# Patient Record
Sex: Female | Born: 1955
Health system: Southern US, Community
[De-identification: ages and names within clinical notes are randomized; demographics above are authoritative.]

## PROBLEM LIST (undated history)

## (undated) DIAGNOSIS — I10 Essential (primary) hypertension: Secondary | ICD-10-CM

## (undated) DIAGNOSIS — E785 Hyperlipidemia, unspecified: Secondary | ICD-10-CM

## (undated) DIAGNOSIS — M810 Age-related osteoporosis without current pathological fracture: Secondary | ICD-10-CM

## (undated) DIAGNOSIS — Z87820 Personal history of traumatic brain injury: Secondary | ICD-10-CM

## (undated) DIAGNOSIS — G5601 Carpal tunnel syndrome, right upper limb: Secondary | ICD-10-CM

## (undated) DIAGNOSIS — G43909 Migraine, unspecified, not intractable, without status migrainosus: Secondary | ICD-10-CM

## (undated) HISTORY — DX: Carpal tunnel syndrome, right upper limb: G56.01

## (undated) HISTORY — PX: OTHER SURGICAL HISTORY: SHX169

## (undated) HISTORY — DX: Age-related osteoporosis without current pathological fracture: M81.0

## (undated) HISTORY — DX: Personal history of traumatic brain injury: Z87.820

## (undated) HISTORY — DX: Essential (primary) hypertension: I10

## (undated) HISTORY — DX: Hyperlipidemia, unspecified: E78.5

## (undated) HISTORY — PX: LEEP: SHX91

## (undated) HISTORY — PX: MOUTH SURGERY: SHX715

## (undated) HISTORY — DX: Migraine, unspecified, not intractable, without status migrainosus: G43.909

---
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
## 1997-08-28 ENCOUNTER — Other Ambulatory Visit: Admission: RE | Admit: 1997-08-28 | Discharge: 1997-08-28 | Payer: Self-pay | Admitting: Obstetrics and Gynecology

## 1998-04-22 ENCOUNTER — Other Ambulatory Visit: Admission: RE | Admit: 1998-04-22 | Discharge: 1998-04-22 | Payer: Self-pay | Admitting: Obstetrics and Gynecology

## 1999-04-27 ENCOUNTER — Other Ambulatory Visit: Admission: RE | Admit: 1999-04-27 | Discharge: 1999-04-27 | Payer: Self-pay | Admitting: Obstetrics and Gynecology

## 2000-05-03 ENCOUNTER — Other Ambulatory Visit: Admission: RE | Admit: 2000-05-03 | Discharge: 2000-05-03 | Payer: Self-pay | Admitting: Obstetrics and Gynecology

## 2000-09-18 ENCOUNTER — Other Ambulatory Visit: Admission: RE | Admit: 2000-09-18 | Discharge: 2000-09-18 | Payer: Self-pay | Admitting: Obstetrics and Gynecology

## 2001-06-26 ENCOUNTER — Other Ambulatory Visit: Admission: RE | Admit: 2001-06-26 | Discharge: 2001-06-26 | Payer: Self-pay | Admitting: Obstetrics and Gynecology

## 2002-07-03 ENCOUNTER — Other Ambulatory Visit: Admission: RE | Admit: 2002-07-03 | Discharge: 2002-07-03 | Payer: Self-pay | Admitting: Obstetrics and Gynecology

## 2003-08-07 ENCOUNTER — Other Ambulatory Visit: Admission: RE | Admit: 2003-08-07 | Discharge: 2003-08-07 | Payer: Self-pay | Admitting: Obstetrics and Gynecology

## 2004-09-27 ENCOUNTER — Other Ambulatory Visit: Admission: RE | Admit: 2004-09-27 | Discharge: 2004-09-27 | Payer: Self-pay | Admitting: Obstetrics and Gynecology

## 2005-09-28 ENCOUNTER — Other Ambulatory Visit: Admission: RE | Admit: 2005-09-28 | Discharge: 2005-09-28 | Payer: Self-pay | Admitting: Obstetrics and Gynecology

## 2011-06-27 ENCOUNTER — Other Ambulatory Visit (HOSPITAL_COMMUNITY): Payer: Self-pay | Admitting: Internal Medicine

## 2011-06-27 DIAGNOSIS — Z780 Asymptomatic menopausal state: Secondary | ICD-10-CM

## 2011-07-04 ENCOUNTER — Ambulatory Visit (HOSPITAL_COMMUNITY): Payer: Self-pay

## 2011-07-06 ENCOUNTER — Other Ambulatory Visit: Payer: Self-pay | Admitting: Obstetrics and Gynecology

## 2011-07-06 DIAGNOSIS — R928 Other abnormal and inconclusive findings on diagnostic imaging of breast: Secondary | ICD-10-CM

## 2011-07-10 ENCOUNTER — Ambulatory Visit (HOSPITAL_COMMUNITY)
Admission: RE | Admit: 2011-07-10 | Discharge: 2011-07-10 | Disposition: A | Discharge status: Home / Self Care | Payer: BC Managed Care – PPO | Source: Ambulatory Visit | Attending: Internal Medicine | Admitting: Internal Medicine

## 2011-07-10 DIAGNOSIS — Z13820 Encounter for screening for osteoporosis: Secondary | ICD-10-CM | POA: Insufficient documentation

## 2011-07-10 DIAGNOSIS — Z780 Asymptomatic menopausal state: Secondary | ICD-10-CM

## 2011-07-19 ENCOUNTER — Ambulatory Visit
Admission: RE | Admit: 2011-07-19 | Discharge: 2011-07-19 | Disposition: A | Discharge status: Home / Self Care | Payer: BC Managed Care – PPO | Source: Ambulatory Visit | Attending: Obstetrics and Gynecology | Admitting: Obstetrics and Gynecology

## 2011-07-19 DIAGNOSIS — R928 Other abnormal and inconclusive findings on diagnostic imaging of breast: Secondary | ICD-10-CM

## 2011-09-06 ENCOUNTER — Other Ambulatory Visit: Payer: BC Managed Care – PPO | Admitting: Gastroenterology

## 2011-10-25 ENCOUNTER — Other Ambulatory Visit: Payer: BC Managed Care – PPO | Admitting: Gastroenterology

## 2011-11-21 DIAGNOSIS — G43909 Migraine, unspecified, not intractable, without status migrainosus: Secondary | ICD-10-CM | POA: Insufficient documentation

## 2011-11-21 DIAGNOSIS — M47812 Spondylosis without myelopathy or radiculopathy, cervical region: Secondary | ICD-10-CM | POA: Insufficient documentation

## 2012-02-07 ENCOUNTER — Other Ambulatory Visit (HOSPITAL_COMMUNITY): Payer: Self-pay | Admitting: Internal Medicine

## 2012-02-07 ENCOUNTER — Ambulatory Visit (HOSPITAL_COMMUNITY)
Admission: RE | Admit: 2012-02-07 | Discharge: 2012-02-07 | Disposition: A | Discharge status: Home / Self Care | Payer: BC Managed Care – PPO | Source: Ambulatory Visit | Attending: Internal Medicine | Admitting: Internal Medicine

## 2012-02-07 DIAGNOSIS — M25539 Pain in unspecified wrist: Secondary | ICD-10-CM

## 2012-05-07 ENCOUNTER — Other Ambulatory Visit (HOSPITAL_COMMUNITY): Payer: Self-pay | Admitting: Internal Medicine

## 2012-05-07 ENCOUNTER — Ambulatory Visit (HOSPITAL_COMMUNITY)
Admission: RE | Admit: 2012-05-07 | Discharge: 2012-05-07 | Disposition: A | Discharge status: Home / Self Care | Payer: BC Managed Care – PPO | Source: Ambulatory Visit | Attending: Internal Medicine | Admitting: Internal Medicine

## 2012-05-07 DIAGNOSIS — R52 Pain, unspecified: Secondary | ICD-10-CM

## 2012-05-07 DIAGNOSIS — M25519 Pain in unspecified shoulder: Secondary | ICD-10-CM | POA: Insufficient documentation

## 2012-05-27 ENCOUNTER — Encounter: Payer: Self-pay | Admitting: Gastroenterology

## 2012-06-07 ENCOUNTER — Encounter: Payer: Self-pay | Admitting: Gastroenterology

## 2012-06-07 ENCOUNTER — Ambulatory Visit (AMBULATORY_SURGERY_CENTER): Payer: BC Managed Care – PPO

## 2012-06-07 VITALS — Ht 61.0 in | Wt 107.0 lb

## 2012-06-07 DIAGNOSIS — Z1211 Encounter for screening for malignant neoplasm of colon: Secondary | ICD-10-CM

## 2012-06-07 MED ORDER — MOVIPREP 100 G PO SOLR: 1.0000 | Freq: Once | ORAL | Status: DC

## 2012-07-03 ENCOUNTER — Encounter: Payer: Self-pay | Admitting: Gastroenterology

## 2012-07-03 ENCOUNTER — Ambulatory Visit (AMBULATORY_SURGERY_CENTER): Payer: BC Managed Care – PPO | Admitting: Gastroenterology

## 2012-07-03 VITALS — BP 151/80 | HR 69 | Temp 96.9°F | Resp 47 | Ht 61.0 in | Wt 107.0 lb

## 2012-07-03 DIAGNOSIS — D126 Benign neoplasm of colon, unspecified: Secondary | ICD-10-CM

## 2012-07-03 DIAGNOSIS — Z1211 Encounter for screening for malignant neoplasm of colon: Secondary | ICD-10-CM

## 2012-07-03 MED ORDER — SODIUM CHLORIDE 0.9 % IV SOLN: 500.0000 mL | INTRAVENOUS | Status: DC

## 2012-07-03 NOTE — Progress Notes (Signed)
Propofol given over incremental dosages 

## 2012-07-03 NOTE — Op Note (Signed)
[facility] [address] [place], [address]   COLONOSCOPY PROCEDURE REPORT  PATIENT: Isabel Gazella, [name]  MR#: [id number] BIRTHDATE: June 17, 1956 , 56  yrs. old GENDER: Female ENDOSCOPIST: Meryl Dare, MD, [facility] REFERRED BM:WUXLK Ross, M.D. and Loree Fee, [facility] PROCEDURE DATE:  07/03/2012 PROCEDURE:   Colonoscopy with snare polypectomy ASA CLASS:   Class II INDICATIONS:average risk screening. MEDICATIONS: MAC sedation, administered by CRNA and propofol (Diprivan) 100mg  IV DESCRIPTION OF PROCEDURE:   After the risks benefits and alternatives of the procedure were thoroughly explained, informed consent was obtained.  A digital rectal exam revealed no abnormalities of the rectum.   The LB CF-[id number]  endoscope was introduced through the anus and advanced to the cecum, which was identified by both the appendix and ileocecal valve. No adverse events experienced with a tortuous colon.   The quality of the prep was good, using MoviPrep  The instrument was then slowly withdrawn as the colon was fully examined.  COLON FINDINGS: A sessile polyp measuring 5 mm in size was found in the sigmoid colon.  A polypectomy was performed with a cold snare. The resection was complete and the polyp tissue was completely retrieved.   The colon was otherwise normal.  There was no diverticulosis, inflammation, polyps or cancers unless previously stated.  Retroflexed views revealed no abnormalities. The time to cecum=3 minutes 06 seconds.  Withdrawal time=7 minutes 58 seconds. The scope was withdrawn and the procedure completed.  COMPLICATIONS: There were no complications.  ENDOSCOPIC IMPRESSION: 1.   Sessile polyp measuring 5 mm in the sigmoid colon; polypectomy performed with a cold snare 2.   The colon was otherwise normal  RECOMMENDATIONS: 1.  Await pathology results 2.  Repeat colonoscopy in 5 years if polyp adenomatous; otherwise 10 years   eSigned:   Meryl Dare, MD, [facility] 07/03/2012 2:14 PM

## 2012-07-03 NOTE — Patient Instructions (Addendum)
A handout was given to your care partner on polyps.  You may resume your current medications today.  Please call if any questions or concerns.    YOU HAD AN ENDOSCOPIC PROCEDURE TODAY AT THE [facility]: Refer to the procedure report that was given to you for any specific questions about what was found during the examination.  If the procedure report does not answer your questions, please call your gastroenterologist to clarify.  If you requested that your care partner not be given the details of your procedure findings, then the procedure report has been included in a sealed envelope for you to review at your convenience later.  YOU SHOULD EXPECT: Some feelings of bloating in the abdomen. Passage of more gas than usual.  Walking can help get rid of the air that was put into your GI tract during the procedure and reduce the bloating. If you had a lower endoscopy (such as a colonoscopy or flexible sigmoidoscopy) you may notice spotting of blood in your stool or on the toilet paper. If you underwent a bowel prep for your procedure, then you may not have a normal bowel movement for a few days.  DIET: Your first meal following the procedure should be a light meal and then it is ok to progress to your normal diet.  A half-sandwich or bowl of soup is an example of a good first meal.  Heavy or fried foods are harder to digest and may make you feel nauseous or bloated.  Likewise meals heavy in dairy and vegetables can cause extra gas to form and this can also increase the bloating.  Drink plenty of fluids but you should avoid alcoholic beverages for 24 hours.  ACTIVITY: Your care partner should take you home directly after the procedure.  You should plan to take it easy, moving slowly for the rest of the day.  You can resume normal activity the day after the procedure however you should NOT DRIVE or use heavy machinery for 24 hours (because of the sedation medicines used during the test).    SYMPTOMS  TO REPORT IMMEDIATELY: A gastroenterologist can be reached at any hour.  During normal business hours, 8:30 AM to 5:00 PM Monday through Friday, call [phone].  After hours and on weekends, please call the GI answering service at [phone] who will take a message and have the physician on call contact you.   Following lower endoscopy (colonoscopy or flexible sigmoidoscopy):  Excessive amounts of blood in the stool  Significant tenderness or worsening of abdominal pains  Swelling of the abdomen that is new, acute  Fever of 100F or higher    FOLLOW UP: If any biopsies were taken you will be contacted by phone or by letter within the next 1-3 weeks.  Call your gastroenterologist if you have not heard about the biopsies in 3 weeks.  Our staff will call the home number listed on your records the next business day following your procedure to check on you and address any questions or concerns that you may have at that time regarding the information given to you following your procedure. This is a courtesy call and so if there is no answer at the home number and we have not heard from you through the emergency physician on call, we will assume that you have returned to your regular daily activities without incident.  SIGNATURES/CONFIDENTIALITY: You and/or your care partner have signed paperwork which will be entered into your electronic medical record.    These signatures attest to the fact that that the information above on your After Visit Summary has been reviewed and is understood.  Full responsibility of the confidentiality of this discharge information lies with you and/or your care-partner.  
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
## 2012-07-03 NOTE — Progress Notes (Signed)
No complaints noted in the recovery room. Maw   

## 2012-07-04 ENCOUNTER — Telehealth: Payer: Self-pay

## 2012-07-04 NOTE — Telephone Encounter (Signed)
  Follow up Call-  Call back number 07/03/2012  Post procedure Call Back phone  # [phone]  Permission to leave phone message Yes     Patient questions:  Do you have a fever, pain , or abdominal swelling? no Pain Score  0 *  Have you tolerated food without any problems? yes  Have you been able to return to your normal activities? yes  Do you have any questions about your discharge instructions: Diet   no Medications  no Follow up visit  no  Do you have questions or concerns about your Care? no  Actions: * If pain score is 4 or above: No action needed, pain <4.  Per the pt no problems. Maw

## 2012-07-09 ENCOUNTER — Encounter: Payer: Self-pay | Admitting: Gastroenterology

## 2012-07-10 ENCOUNTER — Other Ambulatory Visit: Payer: Self-pay | Admitting: Obstetrics and Gynecology

## 2012-09-23 ENCOUNTER — Ambulatory Visit (HOSPITAL_COMMUNITY)
Admission: RE | Admit: 2012-09-23 | Discharge: 2012-09-23 | Disposition: A | Discharge status: Home / Self Care | Payer: BC Managed Care – PPO | Source: Ambulatory Visit | Attending: Internal Medicine | Admitting: Internal Medicine

## 2012-09-23 ENCOUNTER — Other Ambulatory Visit (HOSPITAL_COMMUNITY): Payer: Self-pay | Admitting: Internal Medicine

## 2012-09-23 DIAGNOSIS — M25539 Pain in unspecified wrist: Secondary | ICD-10-CM | POA: Insufficient documentation

## 2012-09-23 DIAGNOSIS — M79609 Pain in unspecified limb: Secondary | ICD-10-CM

## 2012-09-23 DIAGNOSIS — M25519 Pain in unspecified shoulder: Secondary | ICD-10-CM | POA: Insufficient documentation

## 2012-11-26 ENCOUNTER — Other Ambulatory Visit (HOSPITAL_COMMUNITY): Payer: Self-pay | Admitting: Internal Medicine

## 2012-11-26 ENCOUNTER — Ambulatory Visit (HOSPITAL_COMMUNITY)
Admission: RE | Admit: 2012-11-26 | Discharge: 2012-11-26 | Disposition: A | Discharge status: Home / Self Care | Payer: BC Managed Care – PPO | Source: Ambulatory Visit | Attending: Internal Medicine | Admitting: Internal Medicine

## 2012-11-26 DIAGNOSIS — R634 Abnormal weight loss: Secondary | ICD-10-CM

## 2012-11-26 DIAGNOSIS — E039 Hypothyroidism, unspecified: Secondary | ICD-10-CM | POA: Insufficient documentation

## 2012-11-26 DIAGNOSIS — J4489 Other specified chronic obstructive pulmonary disease: Secondary | ICD-10-CM | POA: Insufficient documentation

## 2012-11-26 DIAGNOSIS — J449 Chronic obstructive pulmonary disease, unspecified: Secondary | ICD-10-CM | POA: Insufficient documentation

## 2012-12-08 IMAGING — CR DG WRIST 2V*R*
2 series · 2 of 2 positions shown · non-contrast
Comparison: None.

CLINICAL DATA: Lateral wrist pain

RIGHT WRIST - 2 VIEW

[view not recorded (1 of 2)]
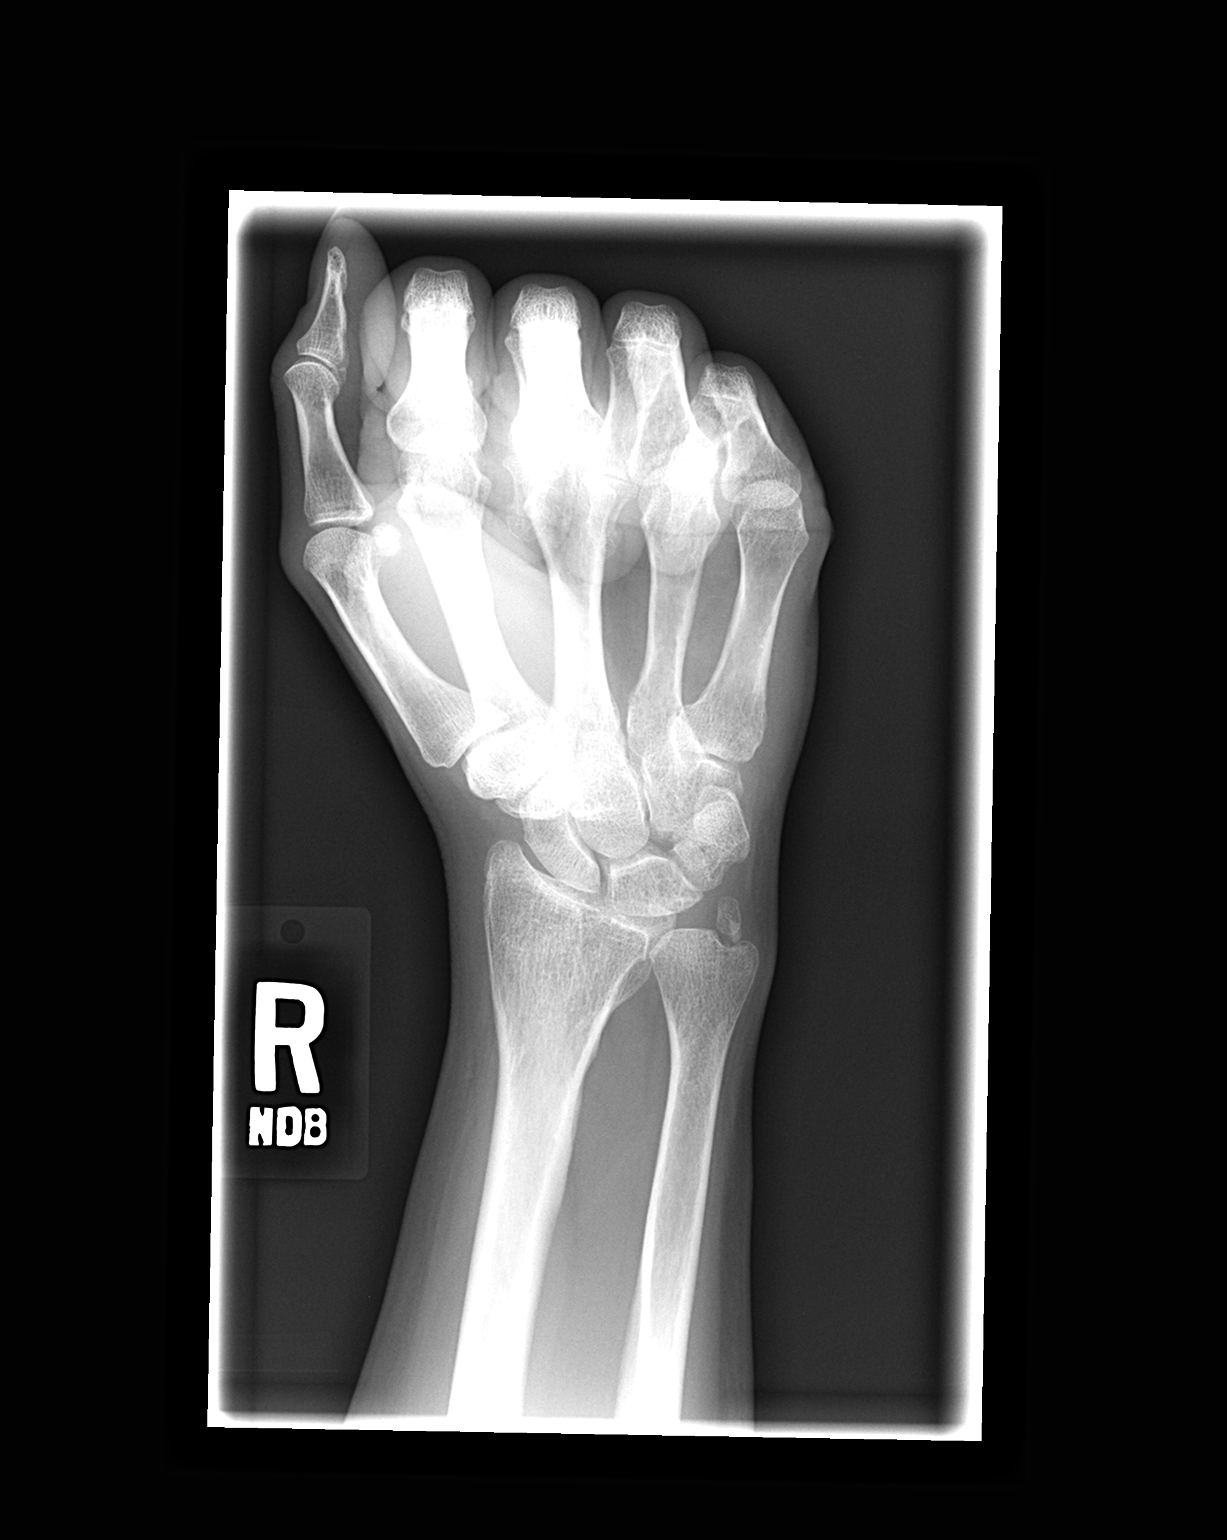

[view not recorded (2 of 2)]
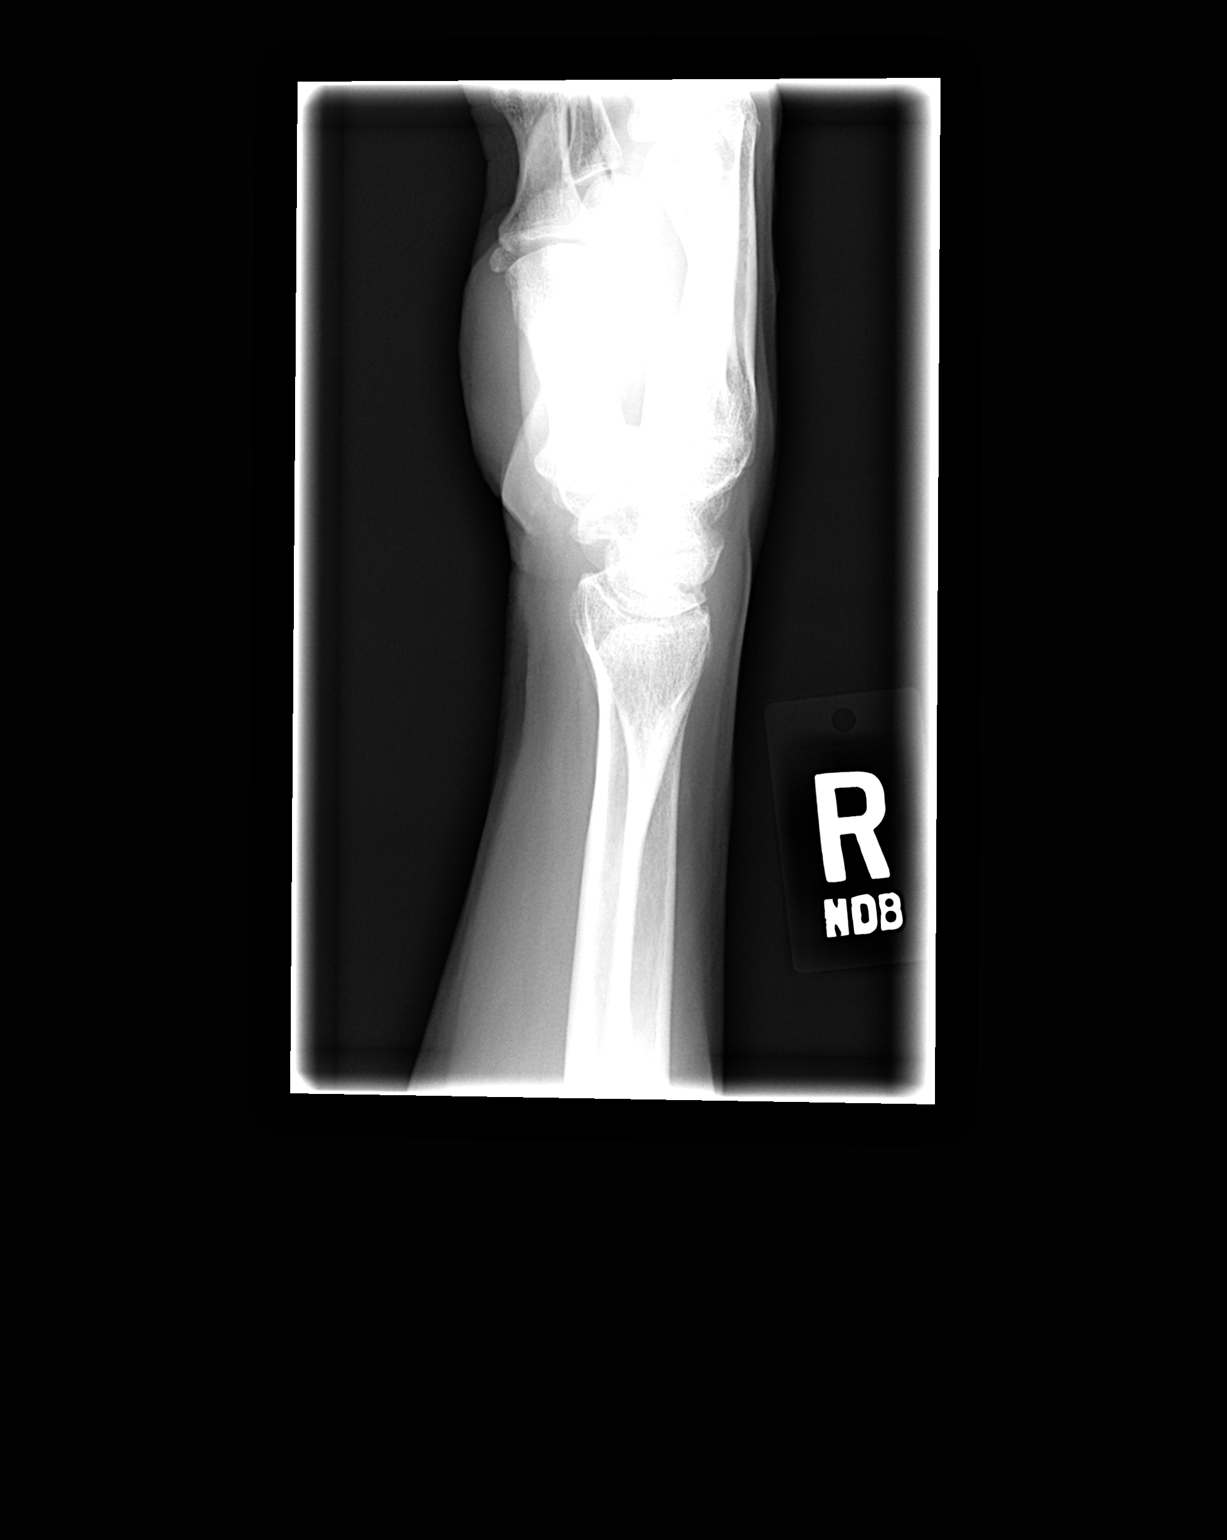

[2 of 2 positions shown; findings below may reference images not displayed]

FINDINGS: No evidence of fracture, dislocation or abnormal position
of the bones to suggest ligamentous injury.  The ulnar styloid is
either a discrete ossicle or the appearance is due to a distant
ununited fracture.
IMPRESSION: The radial side pathology.  No cause of pain identified.

## 2013-01-20 ENCOUNTER — Other Ambulatory Visit: Payer: BC Managed Care – PPO

## 2013-01-20 ENCOUNTER — Encounter: Payer: Self-pay | Admitting: Internal Medicine

## 2013-01-20 ENCOUNTER — Ambulatory Visit (INDEPENDENT_AMBULATORY_CARE_PROVIDER_SITE_OTHER): Payer: BC Managed Care – PPO | Admitting: Internal Medicine

## 2013-01-20 VITALS — BP 120/82 | HR 93 | Ht 61.0 in | Wt 94.2 lb

## 2013-01-20 DIAGNOSIS — J019 Acute sinusitis, unspecified: Secondary | ICD-10-CM

## 2013-01-20 DIAGNOSIS — J441 Chronic obstructive pulmonary disease with (acute) exacerbation: Secondary | ICD-10-CM

## 2013-01-20 DIAGNOSIS — J449 Chronic obstructive pulmonary disease, unspecified: Secondary | ICD-10-CM

## 2013-01-20 DIAGNOSIS — J0191 Acute recurrent sinusitis, unspecified: Secondary | ICD-10-CM

## 2013-01-20 MED ORDER — MONTELUKAST SODIUM 10 MG PO TABS: 10.0000 mg | ORAL_TABLET | Freq: Every day | ORAL | Status: DC

## 2013-01-20 NOTE — Patient Instructions (Addendum)
Script for Singulair -  daily as an airway anti-inflammatory     Try this for a month  Order- - schedule PFT  Order- lab- a1AT, Allergy Profile     Dx COPD
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
## 2013-01-21 LAB — ALLERGY FULL PROFILE
Alternaria Alternata: <0.1 kU/L
Aspergillus fumigatus, m3: <0.1 kU/L
Bahia Grass: <0.1 kU/L
Candida Albicans: <0.1 kU/L
Cat Dander: <0.1 kU/L
Curvularia lunata: <0.1 kU/L
D. farinae: <0.1 kU/L
Elm IgE: <0.1 kU/L
Fescue: <0.1 kU/L
G009 Red Top: <0.1 kU/L
Lamb's Quarters: <0.1 kU/L
Oak: <0.1 kU/L
Sycamore Tree: <0.1 kU/L
Timothy Grass: <0.1 kU/L

## 2013-01-25 LAB — ALPHA-1 ANTITRYPSIN PHENOTYPE: A-1 Antitrypsin: 151 mg/dL (ref 83–199)

## 2013-01-29 NOTE — Progress Notes (Signed)
Quick Note:  Pt aware of results. ______ 

## 2013-02-02 DIAGNOSIS — J0191 Acute recurrent sinusitis, unspecified: Secondary | ICD-10-CM | POA: Insufficient documentation

## 2013-02-02 NOTE — Assessment & Plan Note (Signed)
She seems to be describing repeated episodes of acute sinusitis with irritant and allergic, and probably acute infection triggers. Cannot exclude a chronic sinusitis with occasional exacerbation. Most of the headaches may be related to her diagnosis of migraine. Plan-allergy profile, try adding Singulair. Consider sinus CT

## 2013-02-02 NOTE — Assessment & Plan Note (Signed)
We discussed radiologic impression of COPD based on hyperinflation. Plan-PFT, alpha-1 antitrypsin level

## 2013-02-10 ENCOUNTER — Ambulatory Visit (INDEPENDENT_AMBULATORY_CARE_PROVIDER_SITE_OTHER): Payer: BC Managed Care – PPO | Admitting: Internal Medicine

## 2013-02-10 DIAGNOSIS — J449 Chronic obstructive pulmonary disease, unspecified: Secondary | ICD-10-CM

## 2013-02-10 LAB — PULMONARY FUNCTION TEST

## 2013-02-10 NOTE — Progress Notes (Signed)
PFT done today. 

## 2013-03-04 ENCOUNTER — Encounter: Payer: Self-pay | Admitting: Internal Medicine

## 2013-03-04 ENCOUNTER — Ambulatory Visit (INDEPENDENT_AMBULATORY_CARE_PROVIDER_SITE_OTHER): Payer: BC Managed Care – PPO | Admitting: Internal Medicine

## 2013-03-04 VITALS — BP 124/86 | HR 90 | Ht 61.0 in | Wt 98.0 lb

## 2013-03-04 DIAGNOSIS — J019 Acute sinusitis, unspecified: Secondary | ICD-10-CM

## 2013-03-04 DIAGNOSIS — J441 Chronic obstructive pulmonary disease with (acute) exacerbation: Secondary | ICD-10-CM

## 2013-03-04 DIAGNOSIS — J0191 Acute recurrent sinusitis, unspecified: Secondary | ICD-10-CM

## 2013-03-04 MED ORDER — AZELASTINE-FLUTICASONE 137-50 MCG/ACT NA SUSP: 1.0000 | Freq: Every day | NASAL | Status: DC

## 2013-03-04 NOTE — Progress Notes (Signed)
01/20/13- 1 yoF former smoker referred courtesy of Loree Fee, [place] for  Dr Oneta Rack, concerned about sinusitis. Diagnosed with acute sinusitis one month ago but has had rhinitis episodically for years. Nasonex and Afrin help. Triggers have included change of seasons, heat,strong odors, air conditioning and weather changes. In the last 2 years she has had nasal congestion each spring, treated with Allegra and Mucinex. With these episodes she has had itching of ears and pharynx. Noted chest x-ray diagnosed "COPD" but she denies wheeze, shortness of breath or cough. Environment: [place] some mold in the basement. Has dogs and cats but no smokers. Quit smoking 1 pack per day about 1996. Had worked as a Dance movement psychotherapist. CXR 11/26/12 IMPRESSION:  COPD. No active disease.  Original Report Authenticated By: Charlett Nose, M.D  03/04/13-  77 yoF former smoker referred courtesy of Loree Fee, [place] for  Dr Oneta Rack, concerned about sinusitis w/ hx COPD 6 wk follow up.  Breathing has improved with dymista/nasonex and singulair.   Still some postnasal drainage with thick mucus, throat clearing. Basement is moldy. Has dogs and cats. a1AT WNL MM 151 Allergy Profile within normal limits total IgE 10 PFT within normal limits 02/26/2013  ROS-see HPI Constitutional:   + weight loss, night sweats, fevers, chills, fatigue, lassitude. HEENT:   + headaches, difficulty swallowing, tooth/dental problems, sore throat,       No-  Sneezing,+ itching, ear ache, +nasal congestion, post nasal drip,  CV:  No-   chest pain, orthopnea, PND, swelling in lower extremities, anasarca, dizziness, palpitations Resp: No-   shortness of breath with exertion or at rest.              No-   productive cough,  No non-productive cough,  No- coughing up of blood.              No-   change in color of mucus.  No- wheezing.   Skin: No-   rash or lesions. GI:  No-   heartburn, indigestion, abdominal pain,  nausea, vomiting,  GU:  MS:  +  joint pain or swelling.   Neuro-     nothing unusual Psych:  No- change in mood or affect. No depression or anxiety.  No memory loss.  OBJ- Physical Exam General- Alert, Oriented, Affect-appropriate, Distress- none acute, petite/ in Skin- rash-none, lesions- none, excoriation- none Lymphadenopathy- none Head- atraumatic            Eyes- Gross vision intact, PERRLA, conjunctivae and secretions clear            Ears- Hearing, canals-normal. + sclerosis R TM            Nose- Clear, no-Septal dev, mucus, polyps, erosion, perforation             Throat- Mallampati II , mucosa clear , drainage- none, tonsils- atrophic. +weak voice Neck- flexible , trachea midline, no stridor , thyroid nl, carotid no bruit Chest - symmetrical excursion , unlabored           Heart/CV- RRR , no murmur , no gallop  , no rub, nl s1 s2                           - JVD- none , edema- none, stasis changes- none, varices- none           Lung- clear to P&A, wheeze- none, cough- none , dullness-none,  rub- none           Chest wall-  Abd-  Br/ Gen/ Rectal- Not done, not indicated Extrem- cyanosis- none, clubbing, none, atrophy- none, strength- nl Neuro- grossly intact to observation

## 2013-03-04 NOTE — Patient Instructions (Addendum)
Ok to use a Neti pot of saline rinse squeeze bottle as needed, using clean water with salt  Script for [place]

## 2013-03-12 NOTE — Assessment & Plan Note (Signed)
Plan-Singulair, Dymista, Neti pot with clean wter

## 2013-03-12 NOTE — Assessment & Plan Note (Signed)
Does not have significant obstructive airways disease although she was a former smoker. We will emphasize treatment for the sinus component and see how she does.

## 2013-06-03 ENCOUNTER — Telehealth: Payer: Self-pay | Admitting: Emergency Medicine

## 2013-06-03 NOTE — Telephone Encounter (Signed)
PT GOT HER LABS RESULTS BUT WANTS TO GO OVER MORE ON HER THYROID RESULTS PLEASE CALL HER BACK.

## 2013-06-03 NOTE — Telephone Encounter (Signed)
Patient advised of TSH level

## 2013-06-05 ENCOUNTER — Encounter: Payer: Self-pay | Admitting: Internal Medicine

## 2013-06-05 ENCOUNTER — Ambulatory Visit (INDEPENDENT_AMBULATORY_CARE_PROVIDER_SITE_OTHER): Payer: BC Managed Care – PPO | Admitting: Internal Medicine

## 2013-06-05 VITALS — BP 118/78 | HR 86 | Ht 61.0 in | Wt 94.2 lb

## 2013-06-05 DIAGNOSIS — J0191 Acute recurrent sinusitis, unspecified: Secondary | ICD-10-CM

## 2013-06-05 DIAGNOSIS — J441 Chronic obstructive pulmonary disease with (acute) exacerbation: Secondary | ICD-10-CM

## 2013-06-05 DIAGNOSIS — J019 Acute sinusitis, unspecified: Secondary | ICD-10-CM

## 2013-06-05 MED ORDER — PHENYLEPHRINE HCL 1 % NA SOLN: 3.0000 [drp] | Freq: Once | NASAL | Status: DC

## 2013-06-05 MED ORDER — METHYLPREDNISOLONE ACETATE 40 MG/ML IJ SUSP
40.0000 mg | Freq: Once | INTRAMUSCULAR | Status: AC
  Administered 2013-06-05: 40 mg via INTRAMUSCULAR

## 2013-06-05 NOTE — Patient Instructions (Signed)
Neb neo nasal  Depo 40

## 2013-06-05 NOTE — Progress Notes (Signed)
01/20/13- 64 yoF former smoker referred courtesy of Loree Fee, [place] for  Dr Oneta Rack, concerned about sinusitis. Diagnosed with acute sinusitis one month ago but has had rhinitis episodically for years. Nasonex and Afrin help. Triggers have included change of seasons, heat,strong odors, air conditioning and weather changes. In the last 2 years she has had nasal congestion each spring, treated with Allegra and Mucinex. With these episodes she has had itching of ears and pharynx. Noted chest x-ray diagnosed "COPD" but she denies wheeze, shortness of breath or cough. Environment: [place] with some mold in the basement. Has dogs and cats but no smokers. Quit smoking 1 pack per day about 1996. Had worked as a Dance movement psychotherapist. CXR 11/26/12 IMPRESSION:  COPD. No active disease.  Original Report Authenticated By: Charlett Nose, M.D  03/04/13-  53 yoF former smoker referred courtesy of Loree Fee, [place] for  Dr Oneta Rack, concerned about sinusitis w/ hx COPD 6 wk follow up.  Breathing has improved with dymista/nasonex and singulair.   Still some postnasal drainage with thick mucus, throat clearing. Basement is moldy. Has dogs and cats. a1AT WNL MM 151 Allergy Profile within normal limits total IgE 10 PFT within normal limits 02/26/2013  06/05/13- 57 yoF former smoker referred courtesy of Loree Fee, [place] for  Dr Oneta Rack, concerned about sinusitis w/ hx COPD FOLLOWS FOR: Continues to cough in the mornings; has to clear throat alot, tongue is still white. Sinus pressure (painful on Left side) Woke with a left maxillary pain today. Neti pot was uncomfortable. Singulair doesn't help. Dymista burned her nose. Using Nasonex. Complains of white tongue not relieved by nystatin. Frequent throat clearing not related to meals.  ROS-see HPI Constitutional:   No- weight loss, night sweats, fevers, chills, fatigue, lassitude. HEENT:   + headaches, difficulty swallowing, tooth/dental problems,  sore throat,       No-  Sneezing, itching, ear ache, +nasal congestion, post nasal drip,  CV:  No-   chest pain, orthopnea, PND, swelling in lower extremities, anasarca, dizziness, palpitations Resp: No-   shortness of breath with exertion or at rest.              No-   productive cough,  No non-productive cough,  No- coughing up of blood.              No-   change in color of mucus.  No- wheezing.   Skin: No-   rash or lesions. GI:  No-   heartburn, indigestion, abdominal pain, nausea, vomiting,  GU:  MS:  +  joint pain or swelling.   Neuro-     nothing unusual Psych:  No- change in mood or affect. No depression or anxiety.  No memory loss.  OBJ- Physical Exam General- Alert, Oriented, Affect-appropriate, Distress- none acute, petite/ in Skin- rash-none, lesions- none, excoriation- none Lymphadenopathy- none Head- atraumatic            Eyes- Gross vision intact, PERRLA, conjunctivae and secretions clear            Ears- Hearing, canals-normal. + sclerosis R TM            Nose- Clear, no-Septal dev, mucus, polyps, erosion, perforation             Throat- Mallampati II , mucosa - not thrush , drainage- none, tonsils- atrophic.  Neck- flexible , trachea midline, no stridor , thyroid nl, carotid no bruit Chest - symmetrical excursion , unlabored  Heart/CV- RRR , no murmur , no gallop  , no rub, nl s1 s2                           - JVD- none , edema- none, stasis changes- none, varices- none           Lung- clear to P&A, wheeze- none, cough- none , dullness-none, rub- none           Chest wall-  Abd-  Br/ Gen/ Rectal- Not done, not indicated Extrem- cyanosis- none, clubbing, none, atrophy- none, strength- nl Neuro- grossly intact to observation

## 2013-06-20 NOTE — Assessment & Plan Note (Signed)
Cannot define this as COPD with normal pulmonary function tests

## 2013-06-20 NOTE — Assessment & Plan Note (Signed)
Treat as acute rhinitis, nonspecific. Plan nasal nebulizer decongestant, Depo-Medrol

## 2013-08-22 ENCOUNTER — Other Ambulatory Visit: Payer: Self-pay | Admitting: Obstetrics and Gynecology

## 2013-08-23 ENCOUNTER — Other Ambulatory Visit: Payer: Self-pay | Admitting: Emergency Medicine

## 2013-09-07 DIAGNOSIS — I10 Essential (primary) hypertension: Secondary | ICD-10-CM | POA: Insufficient documentation

## 2013-09-07 DIAGNOSIS — E785 Hyperlipidemia, unspecified: Secondary | ICD-10-CM | POA: Insufficient documentation

## 2013-09-08 ENCOUNTER — Encounter: Payer: Self-pay | Admitting: Emergency Medicine

## 2013-09-08 ENCOUNTER — Other Ambulatory Visit: Payer: Self-pay | Admitting: Emergency Medicine

## 2013-09-08 ENCOUNTER — Ambulatory Visit (INDEPENDENT_AMBULATORY_CARE_PROVIDER_SITE_OTHER): Payer: BC Managed Care – PPO | Admitting: Emergency Medicine

## 2013-09-08 VITALS — BP 128/80 | HR 72 | Temp 98.2°F | Resp 16 | Ht 62.0 in | Wt 100.0 lb

## 2013-09-08 DIAGNOSIS — E782 Mixed hyperlipidemia: Secondary | ICD-10-CM

## 2013-09-08 DIAGNOSIS — N390 Urinary tract infection, site not specified: Secondary | ICD-10-CM

## 2013-09-08 DIAGNOSIS — Z0000 Encounter for general adult medical examination without abnormal findings: Secondary | ICD-10-CM

## 2013-09-08 DIAGNOSIS — G43909 Migraine, unspecified, not intractable, without status migrainosus: Secondary | ICD-10-CM

## 2013-09-08 DIAGNOSIS — Z79899 Other long term (current) drug therapy: Secondary | ICD-10-CM

## 2013-09-08 DIAGNOSIS — E559 Vitamin D deficiency, unspecified: Secondary | ICD-10-CM

## 2013-09-08 DIAGNOSIS — Z111 Encounter for screening for respiratory tuberculosis: Secondary | ICD-10-CM

## 2013-09-08 LAB — HEMOGLOBIN A1C
Hgb A1c MFr Bld: 5.6 % (ref <5.7)
MEAN PLASMA GLUCOSE: 114 mg/dL (ref <117)

## 2013-09-08 LAB — CBC WITH DIFFERENTIAL/PLATELET
BASOS ABS: 0 10*3/uL (ref 0.0–0.1)
BASOS PCT: 0 % (ref 0–1)
EOS PCT: 2 % (ref 0–5)
Eosinophils Absolute: 0.1 10*3/uL (ref 0.0–0.7)
HCT: 39.1 % (ref 36.0–46.0)
Hemoglobin: 12.9 g/dL (ref 12.0–15.0)
LYMPHS PCT: 31 % (ref 12–46)
Lymphs Abs: 2.6 10*3/uL (ref 0.7–4.0)
MCH: 28.5 pg (ref 26.0–34.0)
MCHC: 33 g/dL (ref 30.0–36.0)
MCV: 86.3 fL (ref 78.0–100.0)
Monocytes Absolute: 0.8 10*3/uL (ref 0.1–1.0)
Monocytes Relative: 9 % (ref 3–12)
NEUTROS ABS: 5 10*3/uL (ref 1.7–7.7)
Neutrophils Relative %: 58 % (ref 43–77)
PLATELETS: 350 10*3/uL (ref 150–400)
RBC: 4.53 MIL/uL (ref 3.87–5.11)
RDW: 14.7 % (ref 11.5–15.5)
WBC: 8.5 10*3/uL (ref 4.0–10.5)
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
## 2013-09-08 NOTE — Patient Instructions (Addendum)
Migraine Headache A migraine headache is very bad, throbbing pain on one or both sides of your head. Talk to your doctor about what things may bring on (trigger) your migraine headaches. HOME CARE  Only take medicines as told by your doctor.  Lie down in a dark, quiet room when you have a migraine.  Keep a journal to find out if certain things bring on migraine headaches. For example, write down:  What you eat and drink.  How much sleep you get.  Any change to your diet or medicines.  Lessen how much alcohol you drink.  Quit smoking if you smoke.  Get enough sleep.  Lessen any stress in your life.  Keep lights dim if bright lights bother you or make your migraines worse. GET HELP RIGHT AWAY IF:   Your migraine becomes really bad.  You have a fever.  You have a stiff neck.  You have trouble seeing.  Your muscles are weak, or you lose muscle control.  You lose your balance or have trouble walking.  You feel like you will pass out (faint), or you pass out.  You have really bad symptoms that are different than your first symptoms. MAKE SURE YOU:   Understand these instructions.  Will watch your condition.  Will get help right away if you are not doing well or get worse. Document Released: 04/25/2008 Document Revised: 10/09/2011 Document Reviewed: 03/24/2013 [facility] Patient Information 2014 [place]. Tuberculin Skin Test The PPD skin test is a method used to help with the diagnosis of a disease called tuberculosis (TB). HOW THE TEST IS DONE  The test site (usually the forearm) is cleansed. The PPD extract is then injected under the top layer of skin, causing a blister to form on the skin. The reaction will take 48 - 72 hours to develop. You must return to your health care provider within that time to have the area checked. This will determine whether you have had a significant reaction to the PPD test. A reaction is measured in millimeters of hard swelling  (induration) at the site. PREPARATION FOR TEST  There is no special preparation for this test. People with a skin rash or other skin irritations on their arms may need to have the test performed at a different spot on the body. Tell your health care provider if you have ever had a positive PPD skin test. If so, you should not have a repeat PPD test. Tell your doctor if you have a medical condition or if you take certain drugs, such as steroids, that can affect your immune system. These situations may lead to inaccurate test results. NORMAL FINDINGS A negative reaction (no induration) or a level of hard swelling that falls below a certain cutoff may mean that a person has not been infected with the bacteria that cause TB. There are different cutoffs for children, people with HIV, and other risk groups. Unfortunately, this is not a perfect test, and up to 20% of people infected with tuberculosis may not have a reaction on the PPD skin test. In addition, certain conditions that affect the immune system (cancer, recent chemotherapy, late-stage AIDS) may cause a false-negative test result.  The reaction will take 48 - 72 hours to develop. You must return to your health care provider within that time to have the area checked. Follow your caregiver's instructions as to where and when to report for this to be done. Ranges for normal findings may vary among different laboratories and hospitals.  You should always check with your doctor after having lab work or other tests done to discuss the meaning of your test results and whether your values are considered within normal limits. WHAT ABNORMAL RESULTS MEAN  The results of the test depend on the size of the skin reaction and on the person being tested.  A small reaction (5 mm of hard swelling at the site) is considered to be positive in people who have HIV, who are taking steroid therapy, or who have been in close contact with a person who has active  tuberculosis. Larger reactions (greater than or equal to 10 mm) are considered positive in people with diabetes or kidney failure, and in health care workers, among others. In people with no known risks for tuberculosis, a positive reaction requires 15 mm or more of hard swelling at the site. RISKS AND COMPLICATIONS There is a very small risk of severe redness and swelling of the arm in people who have had a previous positive PPD test and who have the test again. There also have been a few rare cases of this reaction in people who have not been tested before. CONSIDERATIONS  A positive skin test does not necessarily mean that a person has active tuberculosis. More tests will be done to check whether active disease is present. Many people who were born outside the [place] may have had a vaccine called "BCG," which can lead to a false-positive test result. MEANING OF TEST  Your caregiver will go over the test results with you and discuss the importance and meaning of your results, as well as treatment options and the need for additional tests if necessary. OBTAINING THE TEST RESULTS It is your responsibility to obtain your test results. Ask the lab or department performing the test when and how you will get your results. Document Released: 04/26/2005 Document Revised: 10/09/2011 Document Reviewed: 06/28/2008 [facility] Patient Information 2014 [place], [place].

## 2013-09-09 LAB — MICROALBUMIN / CREATININE URINE RATIO
CREATININE, URINE: 109.9 mg/dL
MICROALB UR: 2.59 mg/dL — AB (ref 0.00–1.89)
MICROALB/CREAT RATIO: 23.6 mg/g (ref 0.0–30.0)

## 2013-09-09 LAB — URINALYSIS, ROUTINE W REFLEX MICROSCOPIC
BILIRUBIN URINE: NEGATIVE
Glucose, UA: NEGATIVE mg/dL
HGB URINE DIPSTICK: NEGATIVE
KETONES UR: NEGATIVE mg/dL
Leukocytes, UA: NEGATIVE
NITRITE: NEGATIVE
PH: 7.5 (ref 5.0–8.0)
Protein, ur: NEGATIVE mg/dL
Specific Gravity, Urine: 1.015 (ref 1.005–1.030)
Urobilinogen, UA: 0.2 mg/dL (ref 0.0–1.0)

## 2013-09-09 LAB — HEPATIC FUNCTION PANEL
ALT: 16 U/L (ref 0–35)
AST: 27 U/L (ref 0–37)
Albumin: 4.5 g/dL (ref 3.5–5.2)
Alkaline Phosphatase: 40 U/L (ref 39–117)
BILIRUBIN DIRECT: 0.1 mg/dL (ref 0.0–0.3)
BILIRUBIN INDIRECT: 0.2 mg/dL (ref 0.2–1.2)
Total Bilirubin: 0.3 mg/dL (ref 0.2–1.2)
Total Protein: 6.8 g/dL (ref 6.0–8.3)

## 2013-09-09 LAB — URINALYSIS, MICROSCOPIC ONLY
Bacteria, UA: NONE SEEN
CRYSTALS: NONE SEEN
SQUAMOUS EPITHELIAL / LPF: NONE SEEN

## 2013-09-09 LAB — MAGNESIUM: MAGNESIUM: 2.1 mg/dL (ref 1.5–2.5)

## 2013-09-09 LAB — BASIC METABOLIC PANEL WITH GFR
BUN: 12 mg/dL (ref 6–23)
CHLORIDE: 99 meq/L (ref 96–112)
CO2: 27 mEq/L (ref 19–32)
Calcium: 10 mg/dL (ref 8.4–10.5)
Creat: 0.93 mg/dL (ref 0.50–1.10)
GFR, EST NON AFRICAN AMERICAN: 67 mL/min
GFR, Est African American: 78 mL/min
Glucose, Bld: 70 mg/dL (ref 70–99)
POTASSIUM: 4 meq/L (ref 3.5–5.3)
Sodium: 139 mEq/L (ref 135–145)

## 2013-09-09 LAB — INSULIN, FASTING: INSULIN FASTING, SERUM: 4 u[IU]/mL (ref 3–28)

## 2013-09-09 LAB — LIPID PANEL
Cholesterol: 210 mg/dL — ABNORMAL HIGH (ref 0–200)
HDL: 83 mg/dL (ref >39)
LDL CALC: 102 mg/dL — AB (ref 0–99)
Total CHOL/HDL Ratio: 2.5 Ratio
Triglycerides: 124 mg/dL (ref <150)
VLDL: 25 mg/dL (ref 0–40)

## 2013-09-09 LAB — VITAMIN D 25 HYDROXY (VIT D DEFICIENCY, FRACTURES): Vit D, 25-Hydroxy: 82 ng/mL (ref 30–89)

## 2013-09-09 LAB — TSH: TSH: 1.695 u[IU]/mL (ref 0.350–4.500)

## 2013-09-11 ENCOUNTER — Other Ambulatory Visit: Payer: Self-pay | Admitting: Emergency Medicine

## 2013-09-11 ENCOUNTER — Telehealth: Payer: Self-pay

## 2013-09-11 ENCOUNTER — Encounter: Payer: Self-pay | Admitting: Emergency Medicine

## 2013-09-11 DIAGNOSIS — Z780 Asymptomatic menopausal state: Secondary | ICD-10-CM

## 2013-09-11 LAB — URINE CULTURE

## 2013-09-11 MED ORDER — NITROFURANTOIN MONOHYD MACRO 100 MG PO CAPS: 100.0000 mg | ORAL_CAPSULE | Freq: Two times a day (BID) | ORAL | Status: AC

## 2013-09-11 NOTE — Telephone Encounter (Signed)
Pt called to let you know she saw Dr. Harle Battiest and had a mammogram on 08-22-13. She has not had a BMD and is requesting to be set up for one.

## 2013-09-12 LAB — TB SKIN TEST
Induration: 0 mm
TB Skin Test: NEGATIVE

## 2013-09-14 ENCOUNTER — Other Ambulatory Visit: Payer: Self-pay | Admitting: Emergency Medicine

## 2013-09-24 ENCOUNTER — Ambulatory Visit (INDEPENDENT_AMBULATORY_CARE_PROVIDER_SITE_OTHER): Payer: BC Managed Care – PPO | Admitting: Emergency Medicine

## 2013-09-24 ENCOUNTER — Encounter: Payer: Self-pay | Admitting: Emergency Medicine

## 2013-09-24 VITALS — BP 120/80 | HR 72 | Temp 98.6°F | Resp 16 | Ht 61.0 in | Wt 101.0 lb

## 2013-09-24 DIAGNOSIS — M8580 Other specified disorders of bone density and structure, unspecified site: Secondary | ICD-10-CM

## 2013-09-24 DIAGNOSIS — M899 Disorder of bone, unspecified: Secondary | ICD-10-CM

## 2013-09-24 DIAGNOSIS — N390 Urinary tract infection, site not specified: Secondary | ICD-10-CM

## 2013-09-24 DIAGNOSIS — R109 Unspecified abdominal pain: Secondary | ICD-10-CM

## 2013-09-24 DIAGNOSIS — M949 Disorder of cartilage, unspecified: Secondary | ICD-10-CM

## 2013-09-24 MED ORDER — LEVOFLOXACIN 250 MG PO TABS: 250.0000 mg | ORAL_TABLET | Freq: Every day | ORAL | Status: AC

## 2013-09-24 NOTE — Patient Instructions (Signed)
Urinary Tract Infection °A urinary tract infection (UTI) can occur any place along the urinary tract. The tract includes the kidneys, ureters, bladder, and urethra. A type of germ called bacteria often causes a UTI. UTIs are often helped with antibiotic medicine.  °HOME CARE  °· If given, take antibiotics as told by your doctor. Finish them even if you start to feel better. °· Drink enough fluids to keep your pee (urine) clear or pale yellow. °· Avoid tea, drinks with caffeine, and bubbly (carbonated) drinks. °· Pee often. Avoid holding your pee in for a long time. °· Pee before and after having sex (intercourse). °· Wipe from front to back after you poop (bowel movement) if you are a woman. Use each tissue only once. °GET HELP RIGHT AWAY IF:  °· You have back pain. °· You have lower belly (abdominal) pain. °· You have chills. °· You feel sick to your stomach (nauseous). °· You throw up (vomit). °· Your burning or discomfort with peeing does not go away. °· You have a fever. °· Your symptoms are not better in 3 days. °MAKE SURE YOU:  °· Understand these instructions. °· Will watch your condition. °· Will get help right away if you are not doing well or get worse. °Document Released: 01/03/2008 Document Revised: 04/10/2012 Document Reviewed: 02/15/2012 °[employer]® Patient Information ©2014 [employer]. ° °
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
## 2013-09-25 LAB — URINALYSIS, ROUTINE W REFLEX MICROSCOPIC
Bilirubin Urine: NEGATIVE
Glucose, UA: NEGATIVE mg/dL
HGB URINE DIPSTICK: NEGATIVE
Ketones, ur: NEGATIVE mg/dL
LEUKOCYTES UA: NEGATIVE
NITRITE: NEGATIVE
PROTEIN: NEGATIVE mg/dL
Specific Gravity, Urine: 1.006 (ref 1.005–1.030)
UROBILINOGEN UA: 0.2 mg/dL (ref 0.0–1.0)
pH: 7.5 (ref 5.0–8.0)

## 2013-09-26 LAB — URINE CULTURE
COLONY COUNT: NO GROWTH
ORGANISM ID, BACTERIA: NO GROWTH

## 2013-10-02 ENCOUNTER — Other Ambulatory Visit: Payer: Self-pay | Admitting: Internal Medicine

## 2013-10-07 ENCOUNTER — Ambulatory Visit (HOSPITAL_COMMUNITY)
Admission: RE | Admit: 2013-10-07 | Discharge: 2013-10-07 | Disposition: A | Discharge status: Home / Self Care | Payer: BC Managed Care – PPO | Source: Ambulatory Visit | Attending: Emergency Medicine | Admitting: Emergency Medicine

## 2013-10-07 DIAGNOSIS — Z780 Asymptomatic menopausal state: Secondary | ICD-10-CM

## 2013-10-07 DIAGNOSIS — Z13820 Encounter for screening for osteoporosis: Secondary | ICD-10-CM | POA: Insufficient documentation

## 2013-10-08 ENCOUNTER — Other Ambulatory Visit: Payer: Self-pay | Admitting: Emergency Medicine

## 2013-10-08 MED ORDER — ALENDRONATE SODIUM 70 MG PO TABS: 70.0000 mg | ORAL_TABLET | ORAL | Status: DC

## 2013-10-09 ENCOUNTER — Encounter: Payer: Self-pay | Admitting: Emergency Medicine

## 2013-10-10 ENCOUNTER — Other Ambulatory Visit: Payer: Self-pay | Admitting: Emergency Medicine

## 2013-10-10 MED ORDER — RALOXIFENE HCL 60 MG PO TABS: 60.0000 mg | ORAL_TABLET | Freq: Every day | ORAL | Status: DC

## 2013-10-13 ENCOUNTER — Other Ambulatory Visit: Payer: Self-pay | Admitting: Emergency Medicine

## 2013-10-13 DIAGNOSIS — N390 Urinary tract infection, site not specified: Secondary | ICD-10-CM

## 2013-10-14 ENCOUNTER — Other Ambulatory Visit: Payer: BC Managed Care – PPO

## 2013-10-14 DIAGNOSIS — N3000 Acute cystitis without hematuria: Secondary | ICD-10-CM

## 2013-10-14 DIAGNOSIS — N390 Urinary tract infection, site not specified: Secondary | ICD-10-CM

## 2013-10-15 LAB — URINE CULTURE
COLONY COUNT: NO GROWTH
Organism ID, Bacteria: NO GROWTH

## 2013-10-15 LAB — URINALYSIS, ROUTINE W REFLEX MICROSCOPIC
Bilirubin Urine: NEGATIVE
Glucose, UA: NEGATIVE mg/dL
HGB URINE DIPSTICK: NEGATIVE
LEUKOCYTES UA: NEGATIVE
NITRITE: NEGATIVE
PH: 7.5 (ref 5.0–8.0)
Protein, ur: NEGATIVE mg/dL
SPECIFIC GRAVITY, URINE: 1.018 (ref 1.005–1.030)
Urobilinogen, UA: 0.2 mg/dL (ref 0.0–1.0)

## 2013-11-26 ENCOUNTER — Ambulatory Visit (INDEPENDENT_AMBULATORY_CARE_PROVIDER_SITE_OTHER): Payer: BC Managed Care – PPO | Admitting: Emergency Medicine

## 2013-11-26 ENCOUNTER — Encounter: Payer: Self-pay | Admitting: Emergency Medicine

## 2013-11-26 VITALS — BP 134/98 | HR 68 | Temp 98.2°F | Resp 16 | Wt 98.0 lb

## 2013-11-26 DIAGNOSIS — J309 Allergic rhinitis, unspecified: Secondary | ICD-10-CM

## 2013-11-26 DIAGNOSIS — H9209 Otalgia, unspecified ear: Secondary | ICD-10-CM

## 2013-11-26 MED ORDER — FLUTICASONE PROPIONATE 50 MCG/ACT NA SUSP: 1.0000 | Freq: Every day | NASAL | Status: DC

## 2013-11-26 MED ORDER — PREDNISONE 10 MG PO TABS: ORAL_TABLET | ORAL | Status: DC
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
## 2013-11-26 NOTE — Patient Instructions (Signed)
Allergic Rhinitis Allergic rhinitis is when the mucous membranes in the nose respond to allergens. Allergens are particles in the air that cause your body to have an allergic reaction. This causes you to release allergic antibodies. Through a chain of events, these eventually cause you to release histamine into the blood stream. Although meant to protect the body, it is this release of histamine that causes your discomfort, such as frequent sneezing, congestion, and an itchy, runny nose.  CAUSES  Seasonal allergic rhinitis (hay fever) is caused by pollen allergens that may come from grasses, trees, and weeds. Year-round allergic rhinitis (perennial allergic rhinitis) is caused by allergens such as house dust mites, pet dander, and mold spores.  SYMPTOMS   Nasal stuffiness (congestion).  Itchy, runny nose with sneezing and tearing of the eyes. DIAGNOSIS  Your health care provider can help you determine the allergen or allergens that trigger your symptoms. If you and your health care provider are unable to determine the allergen, skin or blood testing may be used. TREATMENT  Allergic Rhinitis does not have a cure, but it can be controlled by:  Medicines and allergy shots (immunotherapy).  Avoiding the allergen. Hay fever may often be treated with antihistamines in pill or nasal spray forms. Antihistamines block the effects of histamine. There are over-the-counter medicines that may help with nasal congestion and swelling around the eyes. Check with your health care provider before taking or giving this medicine.  If avoiding the allergen or the medicine prescribed do not work, there are many new medicines your health care provider can prescribe. Stronger medicine may be used if initial measures are ineffective. Desensitizing injections can be used if medicine and avoidance does not work. Desensitization is when a patient is given ongoing shots until the body becomes less sensitive to the allergen.  Make sure you follow up with your health care provider if problems continue. HOME CARE INSTRUCTIONS It is not possible to completely avoid allergens, but you can reduce your symptoms by taking steps to limit your exposure to them. It helps to know exactly what you are allergic to so that you can avoid your specific triggers. SEEK MEDICAL CARE IF:   You have a fever.  You develop a cough that does not stop easily (persistent).  You have shortness of breath.  You start wheezing.  Symptoms interfere with normal daily activities. Document Released: 04/11/2001 Document Revised: 05/07/2013 Document Reviewed: 03/24/2013 [employer] Patient Information 2014 [employer].  

## 2013-12-04 ENCOUNTER — Ambulatory Visit: Payer: BC Managed Care – PPO | Admitting: Internal Medicine

## 2013-12-09 ENCOUNTER — Encounter: Payer: Self-pay | Admitting: Internal Medicine

## 2013-12-09 ENCOUNTER — Ambulatory Visit (INDEPENDENT_AMBULATORY_CARE_PROVIDER_SITE_OTHER): Payer: BC Managed Care – PPO | Admitting: Internal Medicine

## 2013-12-09 VITALS — BP 110/74 | HR 96 | Ht 61.0 in | Wt 101.6 lb

## 2013-12-09 DIAGNOSIS — K219 Gastro-esophageal reflux disease without esophagitis: Secondary | ICD-10-CM

## 2013-12-09 DIAGNOSIS — R0989 Other specified symptoms and signs involving the circulatory and respiratory systems: Secondary | ICD-10-CM

## 2013-12-09 DIAGNOSIS — R0609 Other forms of dyspnea: Secondary | ICD-10-CM

## 2013-12-09 DIAGNOSIS — R0600 Dyspnea, unspecified: Secondary | ICD-10-CM

## 2013-12-09 NOTE — Patient Instructions (Addendum)
Try elevating the head of your bed on bricks under the head legs to reduce reflux  Try otc omeprazole/ Prilosec instead of Pepcid  Please call if we can help- Kelby Aline can provide the prescription meds for routine care.

## 2013-12-09 NOTE — Progress Notes (Signed)
01/20/13- 44 yoF former smoker referred courtesy of Isabel [name], [place] for  Dr Melford Aase, concerned about sinusitis. Diagnosed with acute sinusitis one month ago but has had rhinitis episodically for years. Nasonex and Afrin help. Triggers have included change of seasons, heat,strong odors, air conditioning and weather changes. In the last 2 years she has had nasal congestion each spring, treated with Allegra and Mucinex. With these episodes she has had itching of ears and pharynx. Noted chest x-ray diagnosed "COPD" but she denies wheeze, shortness of breath or cough. Environment: [place] with some mold in the basement. Has dogs and cats but no smokers. Quit smoking 1 pack per day about 1996. Had worked as a Electrical engineer. CXR 11/26/12 IMPRESSION:  COPD. No active disease.  Original Report Authenticated By: Rolm Baptise, M.D  03/04/13-  37 yoF former smoker referred courtesy of Isabel [name], [place] for  Dr Melford Aase, concerned about sinusitis w/ hx COPD 6 wk follow up.  Breathing has improved with dymista/nasonex and singulair.   Still some postnasal drainage with thick mucus, throat clearing. Basement is moldy. Has dogs and cats. a1AT WNL MM 151 Allergy Profile within normal limits total IgE 10 PFT within normal limits 02/26/2013  06/05/13- 57 yoF former smoker referred courtesy of Isabel [name], [place] for  Dr Melford Aase, concerned about sinusitis w/ hx COPD FOLLOWS FOR: Continues to cough in the mornings; has to clear throat alot, tongue is still white. Sinus pressure (painful on Left side) Woke with a left maxillary pain today. Neti pot was uncomfortable. Singulair doesn't help. Dymista burned her nose. Using Nasonex. Complains of white tongue not relieved by nystatin. Frequent throat clearing not related to meals.  12/09/13- 23 yoF former smoker followed for hx sinusitis, rhinitis, complicated by GERD   (COPD by CXR but ruled out by PFT) FOLLOWS FOR: thinks reflux is causing her  trouble-clearing her throat alot-started OTC Pepcid-stopped working. Has SOB with activity. Having nasal congestion through this time of year as well. No wheeze or productive cough  ROS-see HPI Constitutional:   No- weight loss, night sweats, fevers, chills, fatigue, lassitude. HEENT:   + headaches, difficulty swallowing, tooth/dental problems, sore throat,       No-  Sneezing, itching, ear ache, +nasal congestion, post nasal drip,  CV:  No-   chest pain, orthopnea, PND, swelling in lower extremities, anasarca, dizziness, palpitations Resp: +shortness of breath with exertion or at rest.              No-   productive cough,  No non-productive cough,  No- coughing up of blood.              No-   change in color of mucus.  No- wheezing.   Skin: No-   rash or lesions. GI:  + heartburn, indigestion, no-abdominal pain, nausea, vomiting,  GU:  MS:  +  joint pain or swelling.   Neuro-     nothing unusual Psych:  No- change in mood or affect. No depression or anxiety.  No memory loss.  OBJ- Physical Exam General- Alert, Oriented, Affect-appropriate, Distress- none acute, petite/ in Skin- rash-none, lesions- none, excoriation- none Lymphadenopathy- none Head- atraumatic            Eyes- Gross vision intact, PERRLA, conjunctivae and secretions clear            Ears- Hearing, canals-normal. + sclerosis R TM            Nose- Clear,  no-Septal dev, mucus, polyps, erosion, perforation             Throat- Mallampati II , mucosa - not thrush , drainage- none, tonsils- atrophic.  Neck- flexible , trachea midline, no stridor , thyroid nl, carotid no bruit Chest - symmetrical excursion , unlabored           Heart/CV- RRR , no murmur , no gallop  , no rub, nl s1 s2                           - JVD- none , edema- none, stasis changes- none, varices- none           Lung- clear to P&A, wheeze- none, cough- none , dullness-none, rub- none           Chest wall-  Abd-  Br/ Gen/ Rectal- Not done, not  indicated Extrem- cyanosis- none, clubbing, none, atrophy- none, strength- nl Neuro- grossly intact to observation

## 2013-12-16 ENCOUNTER — Ambulatory Visit (INDEPENDENT_AMBULATORY_CARE_PROVIDER_SITE_OTHER): Payer: BC Managed Care – PPO | Admitting: Emergency Medicine

## 2013-12-16 ENCOUNTER — Encounter: Payer: Self-pay | Admitting: Emergency Medicine

## 2013-12-16 VITALS — BP 124/76 | HR 88 | Temp 98.2°F | Resp 16 | Ht 61.0 in | Wt 101.0 lb

## 2013-12-16 DIAGNOSIS — G43909 Migraine, unspecified, not intractable, without status migrainosus: Secondary | ICD-10-CM

## 2013-12-16 DIAGNOSIS — E782 Mixed hyperlipidemia: Secondary | ICD-10-CM

## 2013-12-16 LAB — HEPATIC FUNCTION PANEL
ALT: 20 U/L (ref 0–35)
AST: 27 U/L (ref 0–37)
Albumin: 4 g/dL (ref 3.5–5.2)
Alkaline Phosphatase: 29 U/L — ABNORMAL LOW (ref 39–117)
BILIRUBIN DIRECT: 0.1 mg/dL (ref 0.0–0.3)
Indirect Bilirubin: 0.2 mg/dL (ref 0.2–1.2)
TOTAL PROTEIN: 6.5 g/dL (ref 6.0–8.3)
Total Bilirubin: 0.3 mg/dL (ref 0.2–1.2)

## 2013-12-16 LAB — LIPID PANEL
Cholesterol: 249 mg/dL — ABNORMAL HIGH (ref 0–200)
HDL: 65 mg/dL (ref >39)
LDL Cholesterol: 156 mg/dL — ABNORMAL HIGH (ref 0–99)
TRIGLYCERIDES: 141 mg/dL (ref <150)
Total CHOL/HDL Ratio: 3.8 Ratio
VLDL: 28 mg/dL (ref 0–40)

## 2013-12-16 MED ORDER — PREGABALIN 25 MG PO CAPS: 25.0000 mg | ORAL_CAPSULE | Freq: Four times a day (QID) | ORAL | Status: DC
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
## 2013-12-18 ENCOUNTER — Encounter: Payer: Self-pay | Admitting: Emergency Medicine

## 2013-12-20 ENCOUNTER — Encounter: Payer: Self-pay | Admitting: Internal Medicine

## 2014-01-08 ENCOUNTER — Other Ambulatory Visit: Payer: Self-pay | Admitting: *Deleted

## 2014-01-08 MED ORDER — ROSUVASTATIN CALCIUM 10 MG PO TABS: 10.0000 mg | ORAL_TABLET | Freq: Every day | ORAL | Status: DC

## 2014-01-18 ENCOUNTER — Encounter: Payer: Self-pay | Admitting: Internal Medicine

## 2014-01-18 DIAGNOSIS — K219 Gastro-esophageal reflux disease without esophagitis: Secondary | ICD-10-CM | POA: Insufficient documentation

## 2014-01-18 NOTE — Assessment & Plan Note (Signed)
Not sure she is right in blaming reflux for her dyspnea. Need to watch this.

## 2014-01-18 NOTE — Assessment & Plan Note (Signed)
Plan-reflux precautions, elevate head of bed, try omeprazole

## 2014-01-25 ENCOUNTER — Other Ambulatory Visit: Payer: Self-pay | Admitting: Emergency Medicine

## 2014-02-16 ENCOUNTER — Other Ambulatory Visit: Payer: Self-pay | Admitting: Internal Medicine

## 2014-02-23 ENCOUNTER — Encounter: Payer: Self-pay | Admitting: *Deleted

## 2014-03-10 ENCOUNTER — Ambulatory Visit: Payer: Self-pay | Admitting: Emergency Medicine

## 2014-03-13 ENCOUNTER — Other Ambulatory Visit: Payer: Self-pay | Admitting: Emergency Medicine

## 2014-03-20 ENCOUNTER — Encounter: Payer: Self-pay | Admitting: Emergency Medicine

## 2014-04-07 ENCOUNTER — Encounter: Payer: Self-pay | Admitting: Emergency Medicine

## 2014-04-23 ENCOUNTER — Other Ambulatory Visit: Payer: Self-pay | Admitting: Emergency Medicine

## 2014-04-23 ENCOUNTER — Ambulatory Visit (INDEPENDENT_AMBULATORY_CARE_PROVIDER_SITE_OTHER): Payer: BC Managed Care – PPO | Admitting: Emergency Medicine

## 2014-04-23 ENCOUNTER — Encounter: Payer: Self-pay | Admitting: Emergency Medicine

## 2014-04-23 VITALS — BP 138/94 | HR 84 | Temp 98.1°F | Resp 18 | Ht 61.0 in | Wt 110.8 lb

## 2014-04-23 DIAGNOSIS — M79609 Pain in unspecified limb: Secondary | ICD-10-CM

## 2014-04-23 DIAGNOSIS — R5381 Other malaise: Secondary | ICD-10-CM

## 2014-04-23 DIAGNOSIS — E559 Vitamin D deficiency, unspecified: Secondary | ICD-10-CM

## 2014-04-23 DIAGNOSIS — K219 Gastro-esophageal reflux disease without esophagitis: Secondary | ICD-10-CM

## 2014-04-23 DIAGNOSIS — Z1212 Encounter for screening for malignant neoplasm of rectum: Secondary | ICD-10-CM

## 2014-04-23 DIAGNOSIS — R5383 Other fatigue: Secondary | ICD-10-CM

## 2014-04-23 DIAGNOSIS — Z0000 Encounter for general adult medical examination without abnormal findings: Secondary | ICD-10-CM

## 2014-04-23 DIAGNOSIS — Z119 Encounter for screening for infectious and parasitic diseases, unspecified: Secondary | ICD-10-CM

## 2014-04-23 DIAGNOSIS — I10 Essential (primary) hypertension: Secondary | ICD-10-CM

## 2014-04-23 DIAGNOSIS — E782 Mixed hyperlipidemia: Secondary | ICD-10-CM

## 2014-04-23 LAB — RHEUMATOID FACTOR: Rhuematoid fact SerPl-aCnc: <10 IU/mL (ref <=14)

## 2014-04-23 LAB — CK: Total CK: 92 U/L (ref 7–177)
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
## 2014-04-24 LAB — HEPATIC FUNCTION PANEL
ALBUMIN: 4.9 g/dL (ref 3.5–5.2)
ALT: 15 U/L (ref 0–35)
AST: 29 U/L (ref 0–37)
Alkaline Phosphatase: 27 U/L — ABNORMAL LOW (ref 39–117)
BILIRUBIN INDIRECT: 0.2 mg/dL (ref 0.2–1.2)
Bilirubin, Direct: 0.1 mg/dL (ref 0.0–0.3)
TOTAL PROTEIN: 7.2 g/dL (ref 6.0–8.3)
Total Bilirubin: 0.3 mg/dL (ref 0.2–1.2)

## 2014-04-24 LAB — BASIC METABOLIC PANEL WITH GFR
BUN: 8 mg/dL (ref 6–23)
CO2: 27 meq/L (ref 19–32)
Calcium: 9.6 mg/dL (ref 8.4–10.5)
Chloride: 103 mEq/L (ref 96–112)
Creat: 0.85 mg/dL (ref 0.50–1.10)
GFR, Est African American: 87 mL/min
GFR, Est Non African American: 75 mL/min
GLUCOSE: 71 mg/dL (ref 70–99)
POTASSIUM: 4 meq/L (ref 3.5–5.3)
Sodium: 142 mEq/L (ref 135–145)

## 2014-04-24 LAB — LYME ABY, WSTRN BLT IGG & IGM W/BANDS
B BURGDORFERI IGG ABS (IB): NEGATIVE
B burgdorferi IgM Abs (IB): NEGATIVE
LYME DISEASE 41 KD IGG: NONREACTIVE
LYME DISEASE 41 KD IGM: NONREACTIVE
LYME DISEASE 45 KD IGG: NONREACTIVE
LYME DISEASE 58 KD IGG: NONREACTIVE
LYME DISEASE 93 KD IGG: NONREACTIVE
Lyme Disease 18 kD IgG: NONREACTIVE
Lyme Disease 23 kD IgG: NONREACTIVE
Lyme Disease 23 kD IgM: NONREACTIVE
Lyme Disease 28 kD IgG: NONREACTIVE
Lyme Disease 30 kD IgG: NONREACTIVE
Lyme Disease 39 kD IgG: NONREACTIVE
Lyme Disease 39 kD IgM: NONREACTIVE
Lyme Disease 66 kD IgG: NONREACTIVE

## 2014-04-24 LAB — CBC WITH DIFFERENTIAL/PLATELET
BASOS PCT: 0 % (ref 0–1)
Basophils Absolute: 0 10*3/uL (ref 0.0–0.1)
EOS ABS: 0.1 10*3/uL (ref 0.0–0.7)
Eosinophils Relative: 1 % (ref 0–5)
HCT: 39.3 % (ref 36.0–46.0)
HEMOGLOBIN: 13.3 g/dL (ref 12.0–15.0)
LYMPHS ABS: 2.5 10*3/uL (ref 0.7–4.0)
Lymphocytes Relative: 41 % (ref 12–46)
MCH: 28.9 pg (ref 26.0–34.0)
MCHC: 33.8 g/dL (ref 30.0–36.0)
MCV: 85.4 fL (ref 78.0–100.0)
Monocytes Absolute: 1 10*3/uL (ref 0.1–1.0)
Monocytes Relative: 16 % — ABNORMAL HIGH (ref 3–12)
NEUTROS ABS: 2.6 10*3/uL (ref 1.7–7.7)
NEUTROS PCT: 42 % — AB (ref 43–77)
Platelets: 255 10*3/uL (ref 150–400)
RBC: 4.6 MIL/uL (ref 3.87–5.11)
RDW: 14.2 % (ref 11.5–15.5)
WBC: 6.2 10*3/uL (ref 4.0–10.5)

## 2014-04-24 LAB — HEMOGLOBIN A1C
HEMOGLOBIN A1C: 5.8 % — AB (ref <5.7)
Mean Plasma Glucose: 120 mg/dL — ABNORMAL HIGH (ref <117)

## 2014-04-24 LAB — URINALYSIS, ROUTINE W REFLEX MICROSCOPIC
BILIRUBIN URINE: NEGATIVE
GLUCOSE, UA: NEGATIVE mg/dL
HGB URINE DIPSTICK: NEGATIVE
Ketones, ur: NEGATIVE mg/dL
Leukocytes, UA: NEGATIVE
Nitrite: NEGATIVE
Protein, ur: NEGATIVE mg/dL
Specific Gravity, Urine: 1.006 (ref 1.005–1.030)
Urobilinogen, UA: 0.2 mg/dL (ref 0.0–1.0)
pH: 7.5 (ref 5.0–8.0)

## 2014-04-24 LAB — TSH: TSH: 1.66 u[IU]/mL (ref 0.350–4.500)

## 2014-04-24 LAB — INSULIN, FASTING: INSULIN FASTING, SERUM: 3.5 u[IU]/mL (ref 2.0–19.6)

## 2014-04-24 LAB — LIPID PANEL
Cholesterol: 188 mg/dL (ref 0–200)
HDL: 68 mg/dL (ref >39)
LDL Cholesterol: 100 mg/dL — ABNORMAL HIGH (ref 0–99)
Total CHOL/HDL Ratio: 2.8 Ratio
Triglycerides: 101 mg/dL (ref <150)
VLDL: 20 mg/dL (ref 0–40)

## 2014-04-24 LAB — MAGNESIUM: Magnesium: 2.1 mg/dL (ref 1.5–2.5)

## 2014-04-24 LAB — RPR

## 2014-04-24 LAB — ANTI-DNA ANTIBODY, DOUBLE-STRANDED

## 2014-04-24 LAB — HIV ANTIBODY (ROUTINE TESTING W REFLEX): HIV 1&2 Ab, 4th Generation: NONREACTIVE

## 2014-04-24 LAB — SEDIMENTATION RATE: Sed Rate: 4 mm/hr (ref 0–22)

## 2014-04-24 LAB — CYCLIC CITRUL PEPTIDE ANTIBODY, IGG: Cyclic Citrullin Peptide Ab: <2 U/mL (ref 0.0–5.0)

## 2014-04-24 LAB — ANA: Anti Nuclear Antibody(ANA): NEGATIVE

## 2014-04-24 LAB — VITAMIN D 25 HYDROXY (VIT D DEFICIENCY, FRACTURES): Vit D, 25-Hydroxy: 63 ng/mL (ref 30–89)

## 2014-04-25 LAB — ALDOLASE: ALDOLASE: 4.8 U/L (ref <=8.1)

## 2014-04-27 NOTE — Progress Notes (Signed)
#  1 LMOM TO CALL & SCHEDULE

## 2014-04-28 LAB — VITAMIN B12: Vitamin B-12: >2000 pg/mL — ABNORMAL HIGH (ref 211–911)

## 2014-05-01 NOTE — Addendum Note (Signed)
Addended by: Malisa Ruggiero A on: 05/01/2014 06:03 PM   Modules accepted: Orders

## 2014-05-18 ENCOUNTER — Other Ambulatory Visit: Payer: Self-pay | Admitting: Internal Medicine

## 2014-08-06 ENCOUNTER — Other Ambulatory Visit: Payer: Self-pay | Admitting: Emergency Medicine

## 2014-08-10 ENCOUNTER — Ambulatory Visit (INDEPENDENT_AMBULATORY_CARE_PROVIDER_SITE_OTHER): Payer: BLUE CROSS/BLUE SHIELD | Admitting: Physician Assistant

## 2014-08-10 ENCOUNTER — Encounter: Payer: Self-pay | Admitting: Physician Assistant

## 2014-08-10 VITALS — BP 138/92 | HR 74 | Temp 98.4°F | Resp 16 | Ht 61.0 in | Wt 102.0 lb

## 2014-08-10 DIAGNOSIS — J0100 Acute maxillary sinusitis, unspecified: Secondary | ICD-10-CM

## 2014-08-10 MED ORDER — PREDNISONE 20 MG PO TABS: ORAL_TABLET | ORAL | Status: AC

## 2014-08-10 MED ORDER — AZITHROMYCIN 250 MG PO TABS: ORAL_TABLET | ORAL | Status: AC
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
## 2014-08-10 NOTE — Patient Instructions (Signed)
-Take Z-Pak as prescribed -Take prednisone as prescribed for inflammation -Please make sure you are drinking fluids to stay hydrated.  If you are not feeling better in 10-14 days, then please call the office.  Sinusitis Sinusitis is redness, soreness, and inflammation of the paranasal sinuses. Paranasal sinuses are air pockets within the bones of your face (beneath the eyes, the middle of the forehead, or above the eyes). In healthy paranasal sinuses, mucus is able to drain out, and air is able to circulate through them by way of your nose. However, when your paranasal sinuses are inflamed, mucus and air can become trapped. This can allow bacteria and other germs to grow and cause infection. Sinusitis can develop quickly and last only a short time (acute) or continue over a long period (chronic). Sinusitis that lasts for more than 12 weeks is considered chronic.  CAUSES  Causes of sinusitis include:  Allergies.  Structural abnormalities, such as displacement of the cartilage that separates your nostrils (deviated septum), which can decrease the air flow through your nose and sinuses and affect sinus drainage.  Functional abnormalities, such as when the small hairs (cilia) that line your sinuses and help remove mucus do not work properly or are not present. SIGNS AND SYMPTOMS  Symptoms of acute and chronic sinusitis are the same. The primary symptoms are pain and pressure around the affected sinuses. Other symptoms include:  Upper toothache.  Earache.  Headache.  Bad breath.  Decreased sense of smell and taste.  A cough, which worsens when you are lying flat.  Fatigue.  Fever.  Thick drainage from your nose, which often is green and may contain pus (purulent).  Swelling and warmth over the affected sinuses. DIAGNOSIS  Your health care provider will perform a physical exam. During the exam, your health care provider may:  Look in your nose for signs of abnormal growths in your  nostrils (nasal polyps).  Tap over the affected sinus to check for signs of infection.  View the inside of your sinuses (endoscopy) using an imaging device that has a light attached (endoscope). If your health care provider suspects that you have chronic sinusitis, one or more of the following tests may be recommended:  Allergy tests.  Nasal culture. A sample of mucus is taken from your nose, sent to a lab, and screened for bacteria.  Nasal cytology. A sample of mucus is taken from your nose and examined by your health care provider to determine if your sinusitis is related to an allergy. TREATMENT  Most cases of acute sinusitis are related to a viral infection and will resolve on their own within 10 days. Sometimes medicines are prescribed to help relieve symptoms (pain medicine, decongestants, nasal steroid sprays, or saline sprays).  However, for sinusitis related to a bacterial infection, your health care provider will prescribe antibiotic medicines. These are medicines that will help kill the bacteria causing the infection.  Rarely, sinusitis is caused by a fungal infection. In theses cases, your health care provider will prescribe antifungal medicine. For some cases of chronic sinusitis, surgery is needed. Generally, these are cases in which sinusitis recurs more than 3 times per year, despite other treatments. HOME CARE INSTRUCTIONS   Drink plenty of water. Water helps thin the mucus so your sinuses can drain more easily.  Use a humidifier.  Inhale steam 3 to 4 times a day (for example, sit in the bathroom with the shower running).  Apply a warm, moist washcloth to your face 3 to  4 times a day, or as directed by your health care provider.  Use saline nasal sprays to help moisten and clean your sinuses.  Take medicines only as directed by your health care provider.  If you were prescribed either an antibiotic or antifungal medicine, finish it all even if you start to feel  better. SEEK IMMEDIATE MEDICAL CARE IF:  You have increasing pain or severe headaches.  You have nausea, vomiting, or drowsiness.  You have swelling around your face.  You have vision problems.  You have a stiff neck.  You have difficulty breathing. MAKE SURE YOU:   Understand these instructions.  Will watch your condition.  Will get help right away if you are not doing well or get worse. Document Released: 07/17/2005 Document Revised: 12/01/2013 Document Reviewed: 08/01/2011 [facility] Patient Information 2015 [place], [place]. This information is not intended to replace advice given to you by your health care provider. Make sure you discuss any questions you have with your health care provider.

## 2014-08-12 ENCOUNTER — Other Ambulatory Visit: Payer: Self-pay | Admitting: *Deleted

## 2014-08-12 MED ORDER — ROSUVASTATIN CALCIUM 10 MG PO TABS: 10.0000 mg | ORAL_TABLET | Freq: Every day | ORAL | Status: DC

## 2014-08-17 ENCOUNTER — Telehealth: Payer: Self-pay | Admitting: Physician Assistant

## 2014-08-17 DIAGNOSIS — J0100 Acute maxillary sinusitis, unspecified: Secondary | ICD-10-CM

## 2014-08-17 DIAGNOSIS — R059 Cough, unspecified: Secondary | ICD-10-CM

## 2014-08-17 DIAGNOSIS — R05 Cough: Secondary | ICD-10-CM

## 2014-08-17 MED ORDER — AZITHROMYCIN 250 MG PO TABS: ORAL_TABLET | ORAL | Status: AC

## 2014-08-17 MED ORDER — BENZONATATE 100 MG PO CAPS: 200.0000 mg | ORAL_CAPSULE | Freq: Three times a day (TID) | ORAL | Status: DC | PRN

## 2014-08-17 NOTE — Addendum Note (Signed)
Addended by: Charolette Forward on: 08/17/2014 11:14 AM   Modules accepted: Orders

## 2014-08-17 NOTE — Telephone Encounter (Signed)
Entered in error. Duplicate.

## 2014-08-17 NOTE — Telephone Encounter (Addendum)
Patient called stating she is still having sore throat and cough.  She took 1 z-pak and prednisone as prescribed.  She is also taking Advil cold and sinus.    I will send in another Z-Pak and will prescribed tessalon perles for cough. If she is not feeling better in 10-14 days, then please come back in for office visit.   Discussed medication effects and SE's.  Pt agreed to treatment plan. If you are not feeling better in 10-14 days, then please call the office.   Jaclin Finks L, PA-C 11:10 AM

## 2014-08-24 ENCOUNTER — Telehealth: Payer: Self-pay | Admitting: *Deleted

## 2014-08-24 NOTE — Telephone Encounter (Signed)
Patient called with c/o sinus pressure and headache.  No cough, no fever, no other symptoms.  Per [employer], PA-C orders, patient was advised symptoms are probably viral and can try Allegra OTC, Tylenol for headaches, and to follow up with Dr. Lucia Gaskins for further eval and treatment.  Patient has seen Dr. Lucia Gaskins in the past and will call for appointment.

## 2014-08-27 ENCOUNTER — Other Ambulatory Visit: Payer: Self-pay | Admitting: Obstetrics and Gynecology

## 2014-08-28 LAB — CYTOLOGY - PAP

## 2014-09-02 ENCOUNTER — Ambulatory Visit (INDEPENDENT_AMBULATORY_CARE_PROVIDER_SITE_OTHER): Payer: BLUE CROSS/BLUE SHIELD | Admitting: Internal Medicine

## 2014-09-02 ENCOUNTER — Encounter: Payer: Self-pay | Admitting: Internal Medicine

## 2014-09-02 VITALS — BP 118/78 | HR 64 | Temp 98.1°F | Resp 16 | Ht 61.0 in | Wt 102.0 lb

## 2014-09-02 DIAGNOSIS — R7303 Prediabetes: Secondary | ICD-10-CM | POA: Insufficient documentation

## 2014-09-02 DIAGNOSIS — E559 Vitamin D deficiency, unspecified: Secondary | ICD-10-CM

## 2014-09-02 DIAGNOSIS — J0191 Acute recurrent sinusitis, unspecified: Secondary | ICD-10-CM

## 2014-09-02 DIAGNOSIS — Z79899 Other long term (current) drug therapy: Secondary | ICD-10-CM | POA: Insufficient documentation

## 2014-09-02 DIAGNOSIS — R030 Elevated blood-pressure reading, without diagnosis of hypertension: Secondary | ICD-10-CM

## 2014-09-02 DIAGNOSIS — E785 Hyperlipidemia, unspecified: Secondary | ICD-10-CM

## 2014-09-02 DIAGNOSIS — R7309 Other abnormal glucose: Secondary | ICD-10-CM | POA: Insufficient documentation

## 2014-09-02 DIAGNOSIS — IMO0001 Reserved for inherently not codable concepts without codable children: Secondary | ICD-10-CM

## 2014-09-03 ENCOUNTER — Encounter: Payer: Self-pay | Admitting: Internal Medicine

## 2014-09-03 LAB — CBC WITH DIFFERENTIAL/PLATELET
BASOS ABS: 0 10*3/uL (ref 0.0–0.1)
Basophils Relative: 0 % (ref 0–1)
EOS ABS: 0.1 10*3/uL (ref 0.0–0.7)
EOS PCT: 2 % (ref 0–5)
HEMATOCRIT: 38.8 % (ref 36.0–46.0)
Hemoglobin: 12.9 g/dL (ref 12.0–15.0)
Lymphocytes Relative: 30 % (ref 12–46)
Lymphs Abs: 2 10*3/uL (ref 0.7–4.0)
MCH: 28.6 pg (ref 26.0–34.0)
MCHC: 33.2 g/dL (ref 30.0–36.0)
MCV: 86 fL (ref 78.0–100.0)
MPV: 10.4 fL (ref 8.6–12.4)
Monocytes Absolute: 0.5 10*3/uL (ref 0.1–1.0)
Monocytes Relative: 8 % (ref 3–12)
NEUTROS PCT: 60 % (ref 43–77)
Neutro Abs: 4 10*3/uL (ref 1.7–7.7)
Platelets: 238 10*3/uL (ref 150–400)
RBC: 4.51 MIL/uL (ref 3.87–5.11)
RDW: 14.4 % (ref 11.5–15.5)
WBC: 6.7 10*3/uL (ref 4.0–10.5)

## 2014-09-03 LAB — HEPATIC FUNCTION PANEL
ALT: 12 U/L (ref 0–35)
AST: 23 U/L (ref 0–37)
Albumin: 4.3 g/dL (ref 3.5–5.2)
Alkaline Phosphatase: 23 U/L — ABNORMAL LOW (ref 39–117)
BILIRUBIN DIRECT: 0.1 mg/dL (ref 0.0–0.3)
BILIRUBIN INDIRECT: 0.4 mg/dL (ref 0.2–1.2)
Total Bilirubin: 0.5 mg/dL (ref 0.2–1.2)
Total Protein: 6.3 g/dL (ref 6.0–8.3)

## 2014-09-03 LAB — BASIC METABOLIC PANEL WITH GFR
BUN: 11 mg/dL (ref 6–23)
CO2: 32 mEq/L (ref 19–32)
CREATININE: 0.93 mg/dL (ref 0.50–1.10)
Calcium: 10.1 mg/dL (ref 8.4–10.5)
Chloride: 102 mEq/L (ref 96–112)
GFR, Est African American: 78 mL/min
GFR, Est Non African American: 67 mL/min
Glucose, Bld: 79 mg/dL (ref 70–99)
Potassium: 3.6 mEq/L (ref 3.5–5.3)
SODIUM: 141 meq/L (ref 135–145)

## 2014-09-03 LAB — INSULIN, FASTING: Insulin fasting, serum: 7.3 u[IU]/mL (ref 2.0–19.6)

## 2014-09-03 LAB — LIPID PANEL
Cholesterol: 165 mg/dL (ref 0–200)
HDL: 67 mg/dL (ref >39)
LDL Cholesterol: 80 mg/dL (ref 0–99)
Total CHOL/HDL Ratio: 2.5 Ratio
Triglycerides: 89 mg/dL (ref <150)
VLDL: 18 mg/dL (ref 0–40)

## 2014-09-03 LAB — HEMOGLOBIN A1C
Hgb A1c MFr Bld: 6 % — ABNORMAL HIGH (ref <5.7)
Mean Plasma Glucose: 126 mg/dL — ABNORMAL HIGH (ref <117)

## 2014-09-03 LAB — VITAMIN D 25 HYDROXY (VIT D DEFICIENCY, FRACTURES): Vit D, 25-Hydroxy: 49 ng/mL (ref 30–100)

## 2014-09-03 LAB — TSH: TSH: 1.266 u[IU]/mL (ref 0.350–4.500)

## 2014-09-03 LAB — MAGNESIUM: MAGNESIUM: 2 mg/dL (ref 1.5–2.5)

## 2014-09-03 NOTE — Patient Instructions (Signed)
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
## 2014-09-14 ENCOUNTER — Encounter: Payer: Self-pay | Admitting: Emergency Medicine

## 2014-09-15 ENCOUNTER — Other Ambulatory Visit: Payer: Self-pay | Admitting: *Deleted

## 2014-09-15 MED ORDER — ALENDRONATE SODIUM 70 MG PO TABS: 70.0000 mg | ORAL_TABLET | ORAL | Status: DC

## 2014-10-22 ENCOUNTER — Telehealth: Payer: Self-pay | Admitting: Internal Medicine

## 2014-10-22 NOTE — Telephone Encounter (Signed)
faxed office notes to Dr Ernesto Rutherford - [phone].  ph -[phone] for referral appointment.

## 2014-10-28 ENCOUNTER — Other Ambulatory Visit (HOSPITAL_COMMUNITY): Payer: Self-pay | Admitting: Otolaryngology

## 2014-10-28 DIAGNOSIS — J322 Chronic ethmoidal sinusitis: Secondary | ICD-10-CM

## 2014-11-05 ENCOUNTER — Ambulatory Visit (HOSPITAL_COMMUNITY)
Admission: RE | Admit: 2014-11-05 | Discharge: 2014-11-05 | Disposition: A | Discharge status: Home / Self Care | Payer: BLUE CROSS/BLUE SHIELD | Source: Ambulatory Visit | Attending: Otolaryngology | Admitting: Otolaryngology

## 2014-11-05 ENCOUNTER — Encounter (HOSPITAL_COMMUNITY): Payer: Self-pay

## 2014-11-05 DIAGNOSIS — J322 Chronic ethmoidal sinusitis: Secondary | ICD-10-CM | POA: Diagnosis not present

## 2014-11-05 DIAGNOSIS — G43909 Migraine, unspecified, not intractable, without status migrainosus: Secondary | ICD-10-CM | POA: Insufficient documentation

## 2014-11-05 LAB — CREATININE, SERUM
Creatinine, Ser: 0.95 mg/dL (ref 0.50–1.10)
GFR calc Af Amer: 74 mL/min — ABNORMAL LOW (ref >90)
GFR calc non Af Amer: 64 mL/min — ABNORMAL LOW (ref >90)

## 2014-11-05 LAB — BUN: BUN: 9 mg/dL (ref 6–23)

## 2014-11-05 MED ORDER — IOHEXOL 300 MG/ML  SOLN
80.0000 mL | Freq: Once | INTRAMUSCULAR | Status: AC | PRN
  Administered 2014-11-05: 80 mL via INTRAVENOUS

## 2014-11-10 ENCOUNTER — Encounter: Payer: Self-pay | Admitting: Internal Medicine

## 2014-11-17 ENCOUNTER — Encounter: Payer: Self-pay | Admitting: Internal Medicine

## 2014-11-17 ENCOUNTER — Ambulatory Visit (INDEPENDENT_AMBULATORY_CARE_PROVIDER_SITE_OTHER): Payer: BLUE CROSS/BLUE SHIELD | Admitting: Internal Medicine

## 2014-11-17 VITALS — BP 104/72 | HR 88 | Temp 98.6°F | Resp 16 | Ht 61.0 in | Wt 98.0 lb

## 2014-11-17 DIAGNOSIS — J309 Allergic rhinitis, unspecified: Secondary | ICD-10-CM

## 2014-11-17 DIAGNOSIS — K219 Gastro-esophageal reflux disease without esophagitis: Secondary | ICD-10-CM

## 2014-11-17 MED ORDER — PANTOPRAZOLE SODIUM 40 MG PO TBEC: DELAYED_RELEASE_TABLET | ORAL | Status: DC

## 2014-11-17 MED ORDER — MONTELUKAST SODIUM 10 MG PO TABS: ORAL_TABLET | ORAL | Status: DC
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
## 2014-12-08 ENCOUNTER — Encounter: Payer: Self-pay | Admitting: Internal Medicine

## 2014-12-17 ENCOUNTER — Ambulatory Visit (INDEPENDENT_AMBULATORY_CARE_PROVIDER_SITE_OTHER): Payer: BLUE CROSS/BLUE SHIELD | Admitting: Physician Assistant

## 2014-12-17 ENCOUNTER — Encounter: Payer: Self-pay | Admitting: Physician Assistant

## 2014-12-17 VITALS — BP 128/70 | HR 84 | Temp 98.2°F | Resp 16 | Ht 61.0 in | Wt 97.0 lb

## 2014-12-17 DIAGNOSIS — Z79899 Other long term (current) drug therapy: Secondary | ICD-10-CM

## 2014-12-17 DIAGNOSIS — G43809 Other migraine, not intractable, without status migrainosus: Secondary | ICD-10-CM

## 2014-12-17 DIAGNOSIS — E785 Hyperlipidemia, unspecified: Secondary | ICD-10-CM

## 2014-12-17 DIAGNOSIS — R413 Other amnesia: Secondary | ICD-10-CM

## 2014-12-17 DIAGNOSIS — IMO0001 Reserved for inherently not codable concepts without codable children: Secondary | ICD-10-CM

## 2014-12-17 DIAGNOSIS — E559 Vitamin D deficiency, unspecified: Secondary | ICD-10-CM

## 2014-12-17 DIAGNOSIS — R030 Elevated blood-pressure reading, without diagnosis of hypertension: Secondary | ICD-10-CM

## 2014-12-17 DIAGNOSIS — E079 Disorder of thyroid, unspecified: Secondary | ICD-10-CM

## 2014-12-17 LAB — BASIC METABOLIC PANEL WITH GFR
BUN: 14 mg/dL (ref 6–23)
CALCIUM: 10 mg/dL (ref 8.4–10.5)
CO2: 28 mEq/L (ref 19–32)
CREATININE: 0.85 mg/dL (ref 0.50–1.10)
Chloride: 100 mEq/L (ref 96–112)
GFR, EST NON AFRICAN AMERICAN: 75 mL/min
GFR, Est African American: 86 mL/min
GLUCOSE: 90 mg/dL (ref 70–99)
Potassium: 3.6 mEq/L (ref 3.5–5.3)
Sodium: 141 mEq/L (ref 135–145)

## 2014-12-17 LAB — LIPID PANEL
Cholesterol: 182 mg/dL (ref 0–200)
HDL: 69 mg/dL (ref >=46)
LDL Cholesterol: 88 mg/dL (ref 0–99)
TRIGLYCERIDES: 125 mg/dL (ref <150)
Total CHOL/HDL Ratio: 2.6 Ratio
VLDL: 25 mg/dL (ref 0–40)

## 2014-12-17 LAB — CBC WITH DIFFERENTIAL/PLATELET
Basophils Absolute: 0 10*3/uL (ref 0.0–0.1)
Basophils Relative: 0 % (ref 0–1)
EOS ABS: 0.1 10*3/uL (ref 0.0–0.7)
Eosinophils Relative: 1 % (ref 0–5)
HEMATOCRIT: 40.3 % (ref 36.0–46.0)
Hemoglobin: 13.4 g/dL (ref 12.0–15.0)
LYMPHS ABS: 2.1 10*3/uL (ref 0.7–4.0)
Lymphocytes Relative: 26 % (ref 12–46)
MCH: 29 pg (ref 26.0–34.0)
MCHC: 33.3 g/dL (ref 30.0–36.0)
MCV: 87.2 fL (ref 78.0–100.0)
MPV: 10.5 fL (ref 8.6–12.4)
Monocytes Absolute: 0.6 10*3/uL (ref 0.1–1.0)
Monocytes Relative: 7 % (ref 3–12)
NEUTROS ABS: 5.2 10*3/uL (ref 1.7–7.7)
NEUTROS PCT: 66 % (ref 43–77)
Platelets: 262 10*3/uL (ref 150–400)
RBC: 4.62 MIL/uL (ref 3.87–5.11)
RDW: 14 % (ref 11.5–15.5)
WBC: 7.9 10*3/uL (ref 4.0–10.5)

## 2014-12-17 LAB — TSH: TSH: 1.069 u[IU]/mL (ref 0.350–4.500)

## 2014-12-17 LAB — HEPATIC FUNCTION PANEL
ALT: 12 U/L (ref 0–35)
AST: 23 U/L (ref 0–37)
Albumin: 4.9 g/dL (ref 3.5–5.2)
Alkaline Phosphatase: 22 U/L — ABNORMAL LOW (ref 39–117)
BILIRUBIN INDIRECT: 0.4 mg/dL (ref 0.2–1.2)
BILIRUBIN TOTAL: 0.5 mg/dL (ref 0.2–1.2)
Bilirubin, Direct: 0.1 mg/dL (ref 0.0–0.3)
TOTAL PROTEIN: 7.3 g/dL (ref 6.0–8.3)

## 2014-12-17 LAB — VITAMIN B12: Vitamin B-12: 1968 pg/mL — ABNORMAL HIGH (ref 211–911)

## 2014-12-17 LAB — MAGNESIUM: Magnesium: 2.1 mg/dL (ref 1.5–2.5)

## 2014-12-17 MED ORDER — AZELASTINE HCL 0.1 % NA SOLN: 2.0000 | Freq: Two times a day (BID) | NASAL | Status: DC | PRN

## 2014-12-17 MED ORDER — FLUTICASONE PROPIONATE 50 MCG/ACT NA SUSP: 2.0000 | Freq: Every day | NASAL | Status: DC
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
## 2014-12-17 NOTE — Patient Instructions (Addendum)
Marland KitchenGETTING OFF OF PPI's  Take the famotadine the day you take the fosamax    Nexium/protonix/prilosec/Omeprazole/Dexilant/Aciphex are called PPI's, they are great at healing your stomach but should only be taken for a short period of time.     Recent studies have shown that taken for a long time they  can increase the risk of osteoporosis (weakening of your bones), pneumonia, low magnesium, restless legs, Cdiff (infection that causes diarrhea), DEMENTIA and most recently kidney damage / disease / insufficiency.     Due to this information we want to try to stop the PPI but if you try to stop it abruptly this can cause rebound acid and worsening symptoms.   So this is how we want you to get off the PPI:  - Start taking the nexium/protonix/prilosec/PPI  every other day with  zantac (ranitidine) 2 x a day for 2-4 weeks  - then decrease the PPI to every 3 days while taking the zantac (ranitidine) twice a day the other  days for 2-4  Weeks  - then you can try the zantac (ranitidine) once at night or up to 2 x day as needed.  - you can continue on this once at night or stop all together  - Avoid alcohol, spicy foods, NSAIDS (aleve, ibuprofen) at this time. See foods below.   +++++++++++++++++++++++++++++++++++++++++++  Food Choices for Gastroesophageal Reflux Disease  When you have gastroesophageal reflux disease (GERD), the foods you eat and your eating habits are very important. Choosing the right foods can help ease the discomfort of GERD. WHAT GENERAL GUIDELINES DO I NEED TO FOLLOW?  Choose fruits, vegetables, whole grains, low-fat dairy products, and low-fat meat, fish, and poultry.  Limit fats such as oils, salad dressings, butter, nuts, and avocado.  Keep a food diary to identify foods that cause symptoms.  Avoid foods that cause reflux. These may be different for different people.  Eat frequent small meals instead of three large meals each day.  Eat your meals slowly, in a  relaxed setting.  Limit fried foods.  Cook foods using methods other than frying.  Avoid drinking alcohol.  Avoid drinking large amounts of liquids with your meals.  Avoid bending over or lying down until 2-3 hours after eating.   WHAT FOODS ARE NOT RECOMMENDED? The following are some foods and drinks that may worsen your symptoms:  Vegetables Tomatoes. Tomato juice. Tomato and spaghetti sauce. Chili peppers. Onion and garlic. Horseradish. Fruits Oranges, grapefruit, and lemon (fruit and juice). Meats High-fat meats, fish, and poultry. This includes hot dogs, ribs, ham, sausage, salami, and bacon. Dairy Whole milk and chocolate milk. Sour cream. Cream. Butter. Ice cream. Cream cheese.  Beverages Coffee and tea, with or without caffeine. Carbonated beverages or energy drinks. Condiments Hot sauce. Barbecue sauce.  Sweets/Desserts Chocolate and cocoa. Donuts. Peppermint and spearmint. Fats and Oils High-fat foods, including [place] fries and potato chips. Other Vinegar. Strong spices, such as black pepper, white pepper, red pepper, cayenne, curry powder, cloves, ginger, and chili powder. Nexium/protonix/prilosec are called PPI's, they are great at healing your stomach but should only be taken for a short period of time.    Management of Memory Problems  There are some general things you can do to help manage your memory problems.  Your memory may not in fact recover, but by using techniques and strategies you will be able to manage your memory difficulties better.  1)  Establish a routine.  Try to establish and then stick to a  regular routine.  By doing this, you will get used to what to expect and you will reduce the need to rely on your memory.  Also, try to do things at the same time of day, such as taking your medication or checking your calendar first thing in the morning.  Think about think that you can do as a part of a regular routine and make a list.  Then enter  them into a daily planner to remind you.  This will help you establish a routine.  2)  Organize your environment.  Organize your environment so that it is uncluttered.  Decrease visual stimulation.  Place everyday items such as keys or cell phone in the same place every day (ie.  Basket next to front door)  Use post it notes with a brief message to yourself (ie. Turn off light, lock the door)  Use labels to indicate where things go (ie. Which cupboards are for food, dishes, etc.)  Keep a notepad and pen by the telephone to take messages  3)  Memory Aids  A diary or journal/notebook/daily planner  Making a list (shopping list, chore list, to do list that needs to be done)  Using an alarm as a reminder (kitchen timer or cell phone alarm)  Using cell phone to store information (Notes, Calendar, Reminders)  Calendar/White board placed in a prominent position  Post-it notes  In order for memory aids to be useful, you need to have good habits.  It's no good remembering to make a note in your journal if you don't remember to look in it.  Try setting aside a certain time of day to look in journal.  4)  Improving mood and managing fatigue.  There may be other factors that contribute to memory difficulties.  Factors, such as anxiety, depression and tiredness can affect memory.  Regular gentle exercise can help improve your mood and give you more energy.  Simple relaxation techniques may help relieve symptoms of anxiety  Try to get back to completing activities or hobbies you enjoyed doing in the past.  Learn to pace yourself through activities to decrease fatigue.  Find out about some local support groups where you can share experiences with others.  Try and achieve 7-8 hours of sleep at night.

## 2014-12-18 LAB — VITAMIN D 25 HYDROXY (VIT D DEFICIENCY, FRACTURES): Vit D, 25-Hydroxy: 60 ng/mL (ref 30–100)

## 2014-12-20 LAB — METHYLMALONIC ACID, SERUM: Methylmalonic Acid, Quant: 122 nmol/L (ref 87–318)

## 2014-12-21 ENCOUNTER — Encounter: Payer: Self-pay | Admitting: Physician Assistant

## 2014-12-28 ENCOUNTER — Other Ambulatory Visit: Payer: Self-pay | Admitting: Internal Medicine

## 2014-12-29 ENCOUNTER — Encounter: Payer: Self-pay | Admitting: Internal Medicine

## 2015-01-06 ENCOUNTER — Encounter: Payer: Self-pay | Admitting: Internal Medicine

## 2015-01-06 ENCOUNTER — Encounter: Payer: Self-pay | Admitting: Physician Assistant

## 2015-01-06 MED ORDER — AZITHROMYCIN 250 MG PO TABS: ORAL_TABLET | ORAL | Status: AC

## 2015-01-06 MED ORDER — PREDNISONE 20 MG PO TABS: ORAL_TABLET | ORAL | Status: DC

## 2015-01-12 ENCOUNTER — Encounter: Payer: Self-pay | Admitting: Physician Assistant

## 2015-03-29 ENCOUNTER — Other Ambulatory Visit: Payer: Self-pay | Admitting: Internal Medicine

## 2015-03-29 ENCOUNTER — Other Ambulatory Visit: Payer: Self-pay | Admitting: Physician Assistant

## 2015-04-30 ENCOUNTER — Other Ambulatory Visit: Payer: Self-pay | Admitting: Internal Medicine

## 2015-05-04 ENCOUNTER — Encounter: Payer: Self-pay | Admitting: Internal Medicine

## 2015-05-04 ENCOUNTER — Encounter: Payer: Self-pay | Admitting: Emergency Medicine

## 2015-05-04 ENCOUNTER — Ambulatory Visit (INDEPENDENT_AMBULATORY_CARE_PROVIDER_SITE_OTHER): Payer: BLUE CROSS/BLUE SHIELD | Admitting: Internal Medicine

## 2015-05-04 VITALS — BP 138/86 | HR 72 | Temp 97.8°F | Resp 16 | Ht 61.0 in | Wt 100.0 lb

## 2015-05-04 DIAGNOSIS — R030 Elevated blood-pressure reading, without diagnosis of hypertension: Secondary | ICD-10-CM

## 2015-05-04 DIAGNOSIS — Z0000 Encounter for general adult medical examination without abnormal findings: Secondary | ICD-10-CM

## 2015-05-04 DIAGNOSIS — Z789 Other specified health status: Secondary | ICD-10-CM | POA: Diagnosis not present

## 2015-05-04 DIAGNOSIS — IMO0001 Reserved for inherently not codable concepts without codable children: Secondary | ICD-10-CM

## 2015-05-04 DIAGNOSIS — Z1331 Encounter for screening for depression: Secondary | ICD-10-CM

## 2015-05-04 DIAGNOSIS — Z79899 Other long term (current) drug therapy: Secondary | ICD-10-CM

## 2015-05-04 DIAGNOSIS — E785 Hyperlipidemia, unspecified: Secondary | ICD-10-CM

## 2015-05-04 DIAGNOSIS — Z1212 Encounter for screening for malignant neoplasm of rectum: Secondary | ICD-10-CM

## 2015-05-04 DIAGNOSIS — R7303 Prediabetes: Secondary | ICD-10-CM

## 2015-05-04 DIAGNOSIS — E559 Vitamin D deficiency, unspecified: Secondary | ICD-10-CM

## 2015-05-04 DIAGNOSIS — Z1389 Encounter for screening for other disorder: Secondary | ICD-10-CM

## 2015-05-04 DIAGNOSIS — Z130 Encounter for screening for diseases of the blood and blood-forming organs and certain disorders involving the immune mechanism: Secondary | ICD-10-CM

## 2015-05-04 DIAGNOSIS — Z9181 History of falling: Secondary | ICD-10-CM

## 2015-05-04 LAB — BASIC METABOLIC PANEL WITH GFR
BUN: 11 mg/dL (ref 7–25)
CO2: 30 mmol/L (ref 20–31)
Calcium: 10.2 mg/dL (ref 8.6–10.4)
Chloride: 97 mmol/L — ABNORMAL LOW (ref 98–110)
Creat: 1.08 mg/dL — ABNORMAL HIGH (ref 0.50–0.99)
GFR, EST AFRICAN AMERICAN: 64 mL/min (ref >=60)
GFR, Est Non African American: 56 mL/min — ABNORMAL LOW (ref >=60)
GLUCOSE: 82 mg/dL (ref 65–99)
POTASSIUM: 3.4 mmol/L — AB (ref 3.5–5.3)
Sodium: 138 mmol/L (ref 135–146)

## 2015-05-04 LAB — CBC WITH DIFFERENTIAL/PLATELET
BASOS ABS: 0 10*3/uL (ref 0.0–0.1)
Basophils Relative: 0 % (ref 0–1)
EOS PCT: 1 % (ref 0–5)
Eosinophils Absolute: 0.1 10*3/uL (ref 0.0–0.7)
HEMATOCRIT: 42.1 % (ref 36.0–46.0)
Hemoglobin: 13.9 g/dL (ref 12.0–15.0)
LYMPHS ABS: 3 10*3/uL (ref 0.7–4.0)
LYMPHS PCT: 26 % (ref 12–46)
MCH: 28.3 pg (ref 26.0–34.0)
MCHC: 33 g/dL (ref 30.0–36.0)
MCV: 85.7 fL (ref 78.0–100.0)
MONO ABS: 1 10*3/uL (ref 0.1–1.0)
MONOS PCT: 9 % (ref 3–12)
MPV: 10.1 fL (ref 8.6–12.4)
Neutro Abs: 7.4 10*3/uL (ref 1.7–7.7)
Neutrophils Relative %: 64 % (ref 43–77)
Platelets: 334 10*3/uL (ref 150–400)
RBC: 4.91 MIL/uL (ref 3.87–5.11)
RDW: 13.9 % (ref 11.5–15.5)
WBC: 11.6 10*3/uL — ABNORMAL HIGH (ref 4.0–10.5)

## 2015-05-04 LAB — LIPID PANEL
Cholesterol: 213 mg/dL — ABNORMAL HIGH (ref 125–200)
HDL: 85 mg/dL (ref >=46)
LDL CALC: 107 mg/dL (ref <130)
TRIGLYCERIDES: 107 mg/dL (ref <150)
Total CHOL/HDL Ratio: 2.5 Ratio (ref <=5.0)
VLDL: 21 mg/dL (ref <30)

## 2015-05-04 LAB — IRON AND TIBC
%SAT: 21 % (ref 11–50)
IRON: 80 ug/dL (ref 45–160)
TIBC: 380 ug/dL (ref 250–450)
UIBC: 300 ug/dL (ref 125–400)

## 2015-05-04 LAB — HEPATIC FUNCTION PANEL
ALBUMIN: 4.7 g/dL (ref 3.6–5.1)
ALK PHOS: 24 U/L — AB (ref 33–130)
ALT: 10 U/L (ref 6–29)
AST: 25 U/L (ref 10–35)
Bilirubin, Direct: 0.1 mg/dL (ref <=0.2)
Indirect Bilirubin: 0.5 mg/dL (ref 0.2–1.2)
TOTAL PROTEIN: 7.1 g/dL (ref 6.1–8.1)
Total Bilirubin: 0.6 mg/dL (ref 0.2–1.2)

## 2015-05-04 LAB — VITAMIN B12: Vitamin B-12: >2000 pg/mL — ABNORMAL HIGH (ref 211–911)

## 2015-05-04 LAB — MAGNESIUM: Magnesium: 2.1 mg/dL (ref 1.5–2.5)

## 2015-05-04 LAB — TSH: TSH: 1.322 u[IU]/mL (ref 0.350–4.500)

## 2015-05-04 NOTE — Patient Instructions (Addendum)
Dr. Delman Kitten Dr. Elwanda [name]   Preventive Care for Adults  A healthy lifestyle and preventive care can promote health and wellness. Preventive health guidelines for women include the following key practices.  A routine yearly physical is a good way to check with your health care provider about your health and preventive screening. It is a chance to share any concerns and updates on your health and to receive a thorough exam.  Visit your dentist for a routine exam and preventive care every 6 months. Brush your teeth twice a day and floss once a day. Good oral hygiene prevents tooth decay and gum disease.  The frequency of eye exams is based on your age, health, family medical history, use of contact lenses, and other factors. Follow your health care provider's recommendations for frequency of eye exams.  Eat a healthy diet. Foods like vegetables, fruits, whole grains, low-fat dairy products, and lean protein foods contain the nutrients you need without too many calories. Decrease your intake of foods high in solid fats, added sugars, and salt. Eat the right amount of calories for you.Get information about a proper diet from your health care provider, if necessary.  Regular physical exercise is one of the most important things you can do for your health. Most adults should get at least 150 minutes of moderate-intensity exercise (any activity that increases your heart rate and causes you to sweat) each week. In addition, most adults need muscle-strengthening exercises on 2 or more days a week.  Maintain a healthy weight. The body mass index (BMI) is a screening tool to identify possible weight problems. It provides an estimate of body fat based on height and weight. Your health care provider can find your BMI and can help you achieve or maintain a healthy weight.For adults 20 years and older:  A BMI below 18.5 is considered underweight.  A BMI of 18.5 to 24.9 is normal.  A BMI of 25  to 29.9 is considered overweight.  A BMI of 30 and above is considered obese.  Maintain normal blood lipids and cholesterol levels by exercising and minimizing your intake of saturated fat. Eat a balanced diet with plenty of fruit and vegetables. Blood tests for lipids and cholesterol should begin at age 70 and be repeated every 5 years. If your lipid or cholesterol levels are high, you are over 50, or you are at high risk for heart disease, you may need your cholesterol levels checked more frequently.Ongoing high lipid and cholesterol levels should be treated with medicines if diet and exercise are not working.  If you smoke, find out from your health care provider how to quit. If you do not use tobacco, do not start.  Lung cancer screening is recommended for adults aged 55-80 years who are at high risk for developing lung cancer because of a history of smoking. A yearly low-dose CT scan of the lungs is recommended for people who have at least a 30-pack-year history of smoking and are a current smoker or have quit within the past 15 years. A pack year of smoking is smoking an average of 1 pack of cigarettes a day for 1 year (for example: 1 pack a day for 30 years or 2 packs a day for 15 years). Yearly screening should continue until the smoker has stopped smoking for at least 15 years. Yearly screening should be stopped for people who develop a health problem that would prevent them from having lung cancer treatment.  High blood pressure  causes heart disease and increases the risk of stroke. Your blood pressure should be checked at least every 1 to 2 years. Ongoing high blood pressure should be treated with medicines if weight loss and exercise do not work.  If you are 15-22 years old, ask your health care provider if you should take aspirin to prevent strokes.  Diabetes screening involves taking a blood sample to check your fasting blood sugar level. This should be done once every 3 years, after age  26, if you are within normal weight and without risk factors for diabetes. Testing should be considered at a younger age or be carried out more frequently if you are overweight and have at least 1 risk factor for diabetes.  Breast cancer screening is essential preventive care for women. You should practice "breast self-awareness." This means understanding the normal appearance and feel of your breasts and may include breast self-examination. Any changes detected, no matter how small, should be reported to a health care provider. Women in their 14s and 30s should have a clinical breast exam (CBE) by a health care provider as part of a regular health exam every 1 to 3 years. After age 59, women should have a CBE every year. Starting at age 67, women should consider having a mammogram (breast X-ray test) every year. Women who have a family history of breast cancer should talk to their health care provider about genetic screening. Women at a high risk of breast cancer should talk to their health care providers about having an MRI and a mammogram every year.  Breast cancer gene (BRCA)-related cancer risk assessment is recommended for women who have family members with BRCA-related cancers. BRCA-related cancers include breast, ovarian, tubal, and peritoneal cancers. Having family members with these cancers may be associated with an increased risk for harmful changes (mutations) in the breast cancer genes BRCA1 and BRCA2. Results of the assessment will determine the need for genetic counseling and BRCA1 and BRCA2 testing.  Routine pelvic exams to screen for cancer are no longer recommended for nonpregnant women who are considered low risk for cancer of the pelvic organs (ovaries, uterus, and vagina) and who do not have symptoms. Ask your health care provider if a screening pelvic exam is right for you.  If you have had past treatment for cervical cancer or a condition that could lead to cancer, you need Pap tests  and screening for cancer for at least 20 years after your treatment. If Pap tests have been discontinued, your risk factors (such as having a new sexual partner) need to be reassessed to determine if screening should be resumed. Some women have medical problems that increase the chance of getting cervical cancer. In these cases, your health care provider may recommend more frequent screening and Pap tests.  Colorectal cancer can be detected and often prevented. Most routine colorectal cancer screening begins at the age of 36 years and continues through age 25 years. However, your health care provider may recommend screening at an earlier age if you have risk factors for colon cancer. On a yearly basis, your health care provider may provide home test kits to check for hidden blood in the stool. Use of a small camera at the end of a tube, to directly examine the colon (sigmoidoscopy or colonoscopy), can detect the earliest forms of colorectal cancer. Talk to your health care provider about this at age 62, when routine screening begins. Direct exam of the colon should be repeated every 5-10 years through  age 66 years, unless early forms of pre-cancerous polyps or small growths are found.  Hepatitis C blood testing is recommended for all people born from [birth year] through 1965 and any individual with known risks for hepatitis C.  Pra  Osteoporosis is a disease in which the bones lose minerals and strength with aging. This can result in serious bone fractures or breaks. The risk of osteoporosis can be identified using a bone density scan. Women ages 76 years and over and women at risk for fractures or osteoporosis should discuss screening with their health care providers. Ask your health care provider whether you should take a calcium supplement or vitamin D to reduce the rate of osteoporosis.  Menopause can be associated with physical symptoms and risks. Hormone replacement therapy is available to decrease  symptoms and risks. You should talk to your health care provider about whether hormone replacement therapy is right for you.  Use sunscreen. Apply sunscreen liberally and repeatedly throughout the day. You should seek shade when your shadow is shorter than you. Protect yourself by wearing long sleeves, pants, a wide-brimmed hat, and sunglasses year round, whenever you are outdoors.  Once a month, do a whole body skin exam, using a mirror to look at the skin on your back. Tell your health care provider of new moles, moles that have irregular borders, moles that are larger than a pencil eraser, or moles that have changed in shape or color.  Stay current with required vaccines (immunizations).  Influenza vaccine. All adults should be immunized every year.  Tetanus, diphtheria, and acellular pertussis (Td, Tdap) vaccine. Pregnant women should receive 1 dose of Tdap vaccine during each pregnancy. The dose should be obtained regardless of the length of time since the last dose. Immunization is preferred during the 27th-36th week of gestation. An adult who has not previously received Tdap or who does not know her vaccine status should receive 1 dose of Tdap. This initial dose should be followed by tetanus and diphtheria toxoids (Td) booster doses every 10 years. Adults with an unknown or incomplete history of completing a 3-dose immunization series with Td-containing vaccines should begin or complete a primary immunization series including a Tdap dose. Adults should receive a Td booster every 10 years.  Varicella vaccine. An adult without evidence of immunity to varicella should receive 2 doses or a second dose if she has previously received 1 dose. Pregnant females who do not have evidence of immunity should receive the first dose after pregnancy. This first dose should be obtained before leaving the health care facility. The second dose should be obtained 4-8 weeks after the first dose.  Human  papillomavirus (HPV) vaccine. Females aged 13-26 years who have not received the vaccine previously should obtain the 3-dose series. The vaccine is not recommended for use in pregnant females. However, pregnancy testing is not needed before receiving a dose. If a female is found to be pregnant after receiving a dose, no treatment is needed. In that case, the remaining doses should be delayed until after the pregnancy. Immunization is recommended for any person with an immunocompromised condition through the age of 47 years if she did not get any or all doses earlier. During the 3-dose series, the second dose should be obtained 4-8 weeks after the first dose. The third dose should be obtained 24 weeks after the first dose and 16 weeks after the second dose.  Zoster vaccine. One dose is recommended for adults aged 29 years or older unless certain  conditions are present.  Measles, mumps, and rubella (MMR) vaccine. Adults born before 22 generally are considered immune to measles and mumps. Adults born in [birth year] or later should have 1 or more doses of MMR vaccine unless there is a contraindication to the vaccine or there is laboratory evidence of immunity to each of the three diseases. A routine second dose of MMR vaccine should be obtained at least 28 days after the first dose for students attending postsecondary schools, health care workers, or international travelers. People who received inactivated measles vaccine or an unknown type of measles vaccine during 1963-1967 should receive 2 doses of MMR vaccine. People who received inactivated mumps vaccine or an unknown type of mumps vaccine before 1979 and are at high risk for mumps infection should consider immunization with 2 doses of MMR vaccine. For females of childbearing age, rubella immunity should be determined. If there is no evidence of immunity, females who are not pregnant should be vaccinated. If there is no evidence of immunity, females who are pregnant  should delay immunization until after pregnancy. Unvaccinated health care workers born before [birth year] who lack laboratory evidence of measles, mumps, or rubella immunity or laboratory confirmation of disease should consider measles and mumps immunization with 2 doses of MMR vaccine or rubella immunization with 1 dose of MMR vaccine.  Pneumococcal 13-valent conjugate (PCV13) vaccine. When indicated, a person who is uncertain of her immunization history and has no record of immunization should receive the PCV13 vaccine. An adult aged 82 years or older who has certain medical conditions and has not been previously immunized should receive 1 dose of PCV13 vaccine. This PCV13 should be followed with a dose of pneumococcal polysaccharide (PPSV23) vaccine. The PPSV23 vaccine dose should be obtained at least 8 weeks after the dose of PCV13 vaccine. An adult aged 73 years or older who has certain medical conditions and previously received 1 or more doses of PPSV23 vaccine should receive 1 dose of PCV13. The PCV13 vaccine dose should be obtained 1 or more years after the last PPSV23 vaccine dose.    Pneumococcal polysaccharide (PPSV23) vaccine. When PCV13 is also indicated, PCV13 should be obtained first. All adults aged 87 years and older should be immunized. An adult younger than age 52 years who has certain medical conditions should be immunized. Any person who resides in a nursing home or long-term care facility should be immunized. An adult smoker should be immunized. People with an immunocompromised condition and certain other conditions should receive both PCV13 and PPSV23 vaccines. People with human immunodeficiency virus (HIV) infection should be immunized as soon as possible after diagnosis. Immunization during chemotherapy or radiation therapy should be avoided. Routine use of PPSV23 vaccine is not recommended for American Indians, [place]atives, or people younger than 65 years unless there are medical  conditions that require PPSV23 vaccine. When indicated, people who have unknown immunization and have no record of immunization should receive PPSV23 vaccine. One-time revaccination 5 years after the first dose of PPSV23 is recommended for people aged 19-64 years who have chronic kidney failure, nephrotic syndrome, asplenia, or immunocompromised conditions. People who received 1-2 doses of PPSV23 before age 40 years should receive another dose of PPSV23 vaccine at age 81 years or later if at least 5 years have passed since the previous dose. Doses of PPSV23 are not needed for people immunized with PPSV23 at or after age 58 years.  Preventive Services / Frequency   Ages 67 to 25 years  Blood pressure  check.  Lipid and cholesterol check.  Lung cancer screening. / Every year if you are aged 79-80 years and have a 30-pack-year history of smoking and currently smoke or have quit within the past 15 years. Yearly screening is stopped once you have quit smoking for at least 15 years or develop a health problem that would prevent you from having lung cancer treatment.  Clinical breast exam.** / Every year after age 27 years.  BRCA-related cancer risk assessment.** / For women who have family members with a BRCA-related cancer (breast, ovarian, tubal, or peritoneal cancers).  Mammogram.** / Every year beginning at age 3 years and continuing for as long as you are in good health. Consult with your health care provider.  Pap test.** / Every 3 years starting at age 58 years through age 4 or 61 years with a history of 3 consecutive normal Pap tests.  HPV screening.** / Every 3 years from ages 58 years through ages 72 to 70 years with a history of 3 consecutive normal Pap tests.  Fecal occult blood test (FOBT) of stool. / Every year beginning at age 107 years and continuing until age 40 years. You may not need to do this test if you get a colonoscopy every 10 years.  Flexible sigmoidoscopy or  colonoscopy.** / Every 5 years for a flexible sigmoidoscopy or every 10 years for a colonoscopy beginning at age 81 years and continuing until age 45 years.  Hepatitis C blood test.** / For all people born from [birth year] through 1966 and any individual with known risks for hepatitis C.  Skin self-exam. / Monthly.  Influenza vaccine. / Every year.  Tetanus, diphtheria, and acellular pertussis (Tdap/Td) vaccine.** / Consult your health care provider. Pregnant women should receive 1 dose of Tdap vaccine during each pregnancy. 1 dose of Td every 10 years.  Varicella vaccine.** / Consult your health care provider. Pregnant females who do not have evidence of immunity should receive the first dose after pregnancy.  Zoster vaccine.** / 1 dose for adults aged 24 years or older.  Pneumococcal 13-valent conjugate (PCV13) vaccine.** / Consult your health care provider.  Pneumococcal polysaccharide (PPSV23) vaccine.** / 1 to 2 doses if you smoke cigarettes or if you have certain conditions.  Meningococcal vaccine.** / Consult your health care provider.  Hepatitis A vaccine.** / Consult your health care provider.  Hepatitis B vaccine.** / Consult your health care provider. Screening for abdominal aortic aneurysm (AAA)  by ultrasound is recommended for people over 50 who have history of high blood pressure or who are current or former smokers.
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
## 2015-05-05 LAB — URINALYSIS, ROUTINE W REFLEX MICROSCOPIC
Bilirubin Urine: NEGATIVE
GLUCOSE, UA: NEGATIVE
Hgb urine dipstick: NEGATIVE
Ketones, ur: NEGATIVE
LEUKOCYTES UA: NEGATIVE
Nitrite: NEGATIVE
PH: 7.5 (ref 5.0–8.0)
Protein, ur: NEGATIVE
SPECIFIC GRAVITY, URINE: 1.007 (ref 1.001–1.035)

## 2015-05-05 LAB — HEMOGLOBIN A1C
HEMOGLOBIN A1C: 5.7 % — AB (ref <5.7)
MEAN PLASMA GLUCOSE: 117 mg/dL — AB (ref <117)

## 2015-05-05 LAB — MICROALBUMIN / CREATININE URINE RATIO
Creatinine, Urine: 58.1 mg/dL
MICROALB/CREAT RATIO: 79.2 mg/g — AB (ref 0.0–30.0)
Microalb, Ur: 4.6 mg/dL — ABNORMAL HIGH (ref <2.0)

## 2015-05-05 LAB — VITAMIN D 25 HYDROXY (VIT D DEFICIENCY, FRACTURES): VIT D 25 HYDROXY: 65 ng/mL (ref 30–100)

## 2015-05-05 LAB — INSULIN, RANDOM: INSULIN: 1.4 u[IU]/mL — AB (ref 2.0–19.6)

## 2015-07-30 ENCOUNTER — Other Ambulatory Visit: Payer: Self-pay | Admitting: Physician Assistant

## 2015-08-05 ENCOUNTER — Ambulatory Visit: Payer: BLUE CROSS/BLUE SHIELD | Admitting: Internal Medicine

## 2015-08-05 ENCOUNTER — Encounter: Payer: Self-pay | Admitting: Internal Medicine

## 2015-08-05 VITALS — BP 114/76 | HR 72 | Temp 97.7°F | Resp 16 | Ht 61.0 in | Wt 104.2 lb

## 2015-08-05 DIAGNOSIS — G5601 Carpal tunnel syndrome, right upper limb: Secondary | ICD-10-CM

## 2015-08-05 NOTE — Progress Notes (Signed)
  Subjective:    Patient ID: Isabel [name], female    DOB: 07/04/1956, 60 y.o.   MRN: [id number]  HPI  Patient presents for consultation re: Mgmt of R Carpal tunnel syndrome . She relates she has seen 2 different orthopedists and feel that she has received conflicting information  And requests referral to a hand specialist. She c/o pain of the R forearm from the elbow to the wrist and some tingling in the fingers.   Review of Systems .     Objective:   Physical Exam  BP 114/76 mmHg  Pulse 72  Temp(Src) 97.7 F (36.5 C)  Resp 16  Ht 5\' 1"  (1.549 m)  Wt 104 lb 3.2 oz (47.265 kg)  BMI 19.70 kg/m2  (-) tinels on the R. Grip seem sufficient.     Assessment & Plan:   1. Carpal tunnel syndrome of right wrist  - Ambulatory referral to Orthopedic (Hand) Surgery

## 2015-10-04 ENCOUNTER — Other Ambulatory Visit: Payer: Self-pay | Admitting: Obstetrics and Gynecology

## 2015-10-06 ENCOUNTER — Other Ambulatory Visit: Payer: Self-pay | Admitting: Obstetrics and Gynecology

## 2015-10-06 DIAGNOSIS — M810 Age-related osteoporosis without current pathological fracture: Secondary | ICD-10-CM

## 2015-10-07 LAB — CYTOLOGY - PAP

## 2015-10-25 ENCOUNTER — Ambulatory Visit
Admission: RE | Admit: 2015-10-25 | Discharge: 2015-10-25 | Disposition: A | Discharge status: Home / Self Care | Payer: BLUE CROSS/BLUE SHIELD | Source: Ambulatory Visit | Attending: Obstetrics and Gynecology | Admitting: Obstetrics and Gynecology

## 2015-10-25 DIAGNOSIS — M810 Age-related osteoporosis without current pathological fracture: Secondary | ICD-10-CM

## 2015-10-26 ENCOUNTER — Encounter: Payer: Self-pay | Admitting: Internal Medicine

## 2015-11-03 ENCOUNTER — Ambulatory Visit (INDEPENDENT_AMBULATORY_CARE_PROVIDER_SITE_OTHER): Payer: BLUE CROSS/BLUE SHIELD | Admitting: Internal Medicine

## 2015-11-03 ENCOUNTER — Encounter: Payer: Self-pay | Admitting: Internal Medicine

## 2015-11-03 VITALS — BP 128/76 | HR 72 | Temp 98.0°F | Resp 16 | Ht 61.0 in | Wt 96.0 lb

## 2015-11-03 DIAGNOSIS — R030 Elevated blood-pressure reading, without diagnosis of hypertension: Secondary | ICD-10-CM | POA: Diagnosis not present

## 2015-11-03 DIAGNOSIS — E559 Vitamin D deficiency, unspecified: Secondary | ICD-10-CM | POA: Diagnosis not present

## 2015-11-03 DIAGNOSIS — E039 Hypothyroidism, unspecified: Secondary | ICD-10-CM

## 2015-11-03 DIAGNOSIS — IMO0001 Reserved for inherently not codable concepts without codable children: Secondary | ICD-10-CM

## 2015-11-03 DIAGNOSIS — E46 Unspecified protein-calorie malnutrition: Secondary | ICD-10-CM

## 2015-11-03 DIAGNOSIS — E785 Hyperlipidemia, unspecified: Secondary | ICD-10-CM | POA: Diagnosis not present

## 2015-11-03 DIAGNOSIS — Z79899 Other long term (current) drug therapy: Secondary | ICD-10-CM

## 2015-11-03 LAB — BASIC METABOLIC PANEL WITH GFR
BUN: 11 mg/dL (ref 7–25)
CHLORIDE: 105 mmol/L (ref 98–110)
CO2: 31 mmol/L (ref 20–31)
Calcium: 9.8 mg/dL (ref 8.6–10.4)
Creat: 1.05 mg/dL — ABNORMAL HIGH (ref 0.50–0.99)
GFR, Est African American: 66 mL/min (ref >=60)
GFR, Est Non African American: 57 mL/min — ABNORMAL LOW (ref >=60)
Glucose, Bld: 59 mg/dL — ABNORMAL LOW (ref 65–99)
POTASSIUM: 3.5 mmol/L (ref 3.5–5.3)
Sodium: 142 mmol/L (ref 135–146)

## 2015-11-03 LAB — HEPATIC FUNCTION PANEL
ALBUMIN: 4.3 g/dL (ref 3.6–5.1)
ALK PHOS: 19 U/L — AB (ref 33–130)
ALT: 15 U/L (ref 6–29)
AST: 32 U/L (ref 10–35)
BILIRUBIN INDIRECT: 0.4 mg/dL (ref 0.2–1.2)
BILIRUBIN TOTAL: 0.5 mg/dL (ref 0.2–1.2)
Bilirubin, Direct: 0.1 mg/dL (ref <=0.2)
Total Protein: 6.4 g/dL (ref 6.1–8.1)

## 2015-11-03 LAB — CBC WITH DIFFERENTIAL/PLATELET
BASOS PCT: 0 %
Basophils Absolute: 0 cells/uL (ref 0–200)
EOS PCT: 1 %
Eosinophils Absolute: 81 cells/uL (ref 15–500)
HEMATOCRIT: 38.4 % (ref 35.0–45.0)
HEMOGLOBIN: 12.9 g/dL (ref 11.7–15.5)
LYMPHS ABS: 2592 {cells}/uL (ref 850–3900)
Lymphocytes Relative: 32 %
MCH: 28.8 pg (ref 27.0–33.0)
MCHC: 33.6 g/dL (ref 32.0–36.0)
MCV: 85.7 fL (ref 80.0–100.0)
MONO ABS: 810 {cells}/uL (ref 200–950)
MPV: 10.4 fL (ref 7.5–12.5)
Monocytes Relative: 10 %
NEUTROS ABS: 4617 {cells}/uL (ref 1500–7800)
NEUTROS PCT: 57 %
PLATELETS: 260 10*3/uL (ref 140–400)
RBC: 4.48 MIL/uL (ref 3.80–5.10)
RDW: 14 % (ref 11.0–15.0)
WBC: 8.1 10*3/uL (ref 3.8–10.8)

## 2015-11-03 LAB — TSH: TSH: 1.66 mIU/L

## 2015-11-03 MED ORDER — LEVOTHYROXINE SODIUM 25 MCG PO TABS: 25.0000 ug | ORAL_TABLET | Freq: Every day | ORAL | Status: DC

## 2015-11-03 MED ORDER — MIRTAZAPINE 15 MG PO TABS: 15.0000 mg | ORAL_TABLET | Freq: Every day | ORAL | Status: DC

## 2015-11-03 MED ORDER — AZELASTINE HCL 0.1 % NA SOLN: 2.0000 | Freq: Two times a day (BID) | NASAL | Status: DC | PRN

## 2015-11-03 MED ORDER — ROSUVASTATIN CALCIUM 10 MG PO TABS: 10.0000 mg | ORAL_TABLET | Freq: Every day | ORAL | Status: DC
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
## 2015-11-03 NOTE — Patient Instructions (Signed)
Mirtazapine tablets What is this medicine? MIRTAZAPINE (mir TAZ a peen) is used to treat depression. This medicine may be used for other purposes; ask your health care provider or pharmacist if you have questions. What should I tell my health care provider before I take this medicine? They need to know if you have any of these conditions: -bipolar disorder -glaucoma -kidney disease -liver disease -suicidal thoughts -an unusual or allergic reaction to mirtazapine, other medicines, foods, dyes, or preservatives -pregnant or trying to get pregnant -breast-feeding How should I use this medicine? Take this medicine by mouth with a glass of water. Follow the directions on the prescription label. Take your medicine at regular intervals. Do not take your medicine more often than directed. Do not stop taking this medicine suddenly except upon the advice of your doctor. Stopping this medicine too quickly may cause serious side effects or your condition may worsen. A special MedGuide will be given to you by the pharmacist with each prescription and refill. Be sure to read this information carefully each time. Talk to your pediatrician regarding the use of this medicine in children. Special care may be needed. Overdosage: If you think you have taken too much of this medicine contact a poison control center or emergency room at once. NOTE: This medicine is only for you. Do not share this medicine with others. What if I miss a dose? If you miss a dose, take it as soon as you can. If it is almost time for your next dose, take only that dose. Do not take double or extra doses. What may interact with this medicine? Do not take this medicine with any of the following medications: -linezolid -MAOIs like Carbex, Eldepryl, Marplan, Nardil, and Parnate -methylene blue (injected into a vein) This medicine may also interact with the following medications: -alcohol -antiviral medicines for HIV or AIDS -certain  medicines that treat or prevent blood clots like warfarin -certain medicines for depression, anxiety, or psychotic disturbances -certain medicines for fungal infections like ketoconazole and itraconazole -certain medicines for migraine headache like almotriptan, eletriptan, frovatriptan, naratriptan, rizatriptan, sumatriptan, zolmitriptan -certain medicines for seizures like carbamazepine or phenytoin -certain medicines for sleep -cimetidine -erythromycin -fentanyl -lithium -medicines for blood pressure -nefazodone -rasagiline -rifampin -supplements like St. John's wort, kava kava, valerian -tramadol -tryptophan This list may not describe all possible interactions. Give your health care provider a list of all the medicines, herbs, non-prescription drugs, or dietary supplements you use. Also tell them if you smoke, drink alcohol, or use illegal drugs. Some items may interact with your medicine. What should I watch for while using this medicine? Tell your doctor if your symptoms do not get better or if they get worse. Visit your doctor or health care professional for regular checks on your progress. Because it may take several weeks to see the full effects of this medicine, it is important to continue your treatment as prescribed by your doctor. Patients and their families should watch out for new or worsening thoughts of suicide or depression. Also watch out for sudden changes in feelings such as feeling anxious, agitated, panicky, irritable, hostile, aggressive, impulsive, severely restless, overly excited and hyperactive, or not being able to sleep. If this happens, especially at the beginning of treatment or after a change in dose, call your health care professional. You may get drowsy or dizzy. Do not drive, use machinery, or do anything that needs mental alertness until you know how this medicine affects you. Do not stand or sit up   quickly, especially if you are an older patient. This  reduces the risk of dizzy or fainting spells. Alcohol may interfere with the effect of this medicine. Avoid alcoholic drinks. This medicine may cause dry eyes and blurred vision. If you wear contact lenses you may feel some discomfort. Lubricating drops may help. See your eye doctor if the problem does not go away or is severe. Your mouth may get dry. Chewing sugarless gum or sucking hard candy, and drinking plenty of water may help. Contact your doctor if the problem does not go away or is severe. What side effects may I notice from receiving this medicine? Side effects that you should report to your doctor or health care professional as soon as possible: -allergic reactions like skin rash, itching or hives, swelling of the face, lips, or tongue -breathing problems -confusion -fever, sore throat, or mouth ulcers or blisters -flu like symptoms including fever, chills, cough, muscle or joint aches and pains -stomach pain with nausea and/or vomiting -suicidal thoughts or other mood changes -swelling of the hands or feet -unusual bleeding or bruising -unusually weak or tired -vomiting Side effects that usually do not require medical attention (report to your doctor or health care professional if they continue or are bothersome): -constipation -increased appetite -weight gain This list may not describe all possible side effects. Call your doctor for medical advice about side effects. You may report side effects to FDA at 1-800-FDA-1088. Where should I keep my medicine? Keep out of the reach of children. Store at room temperature between 15 and 30 degrees C (59 and 86 degrees F) Protect from light and moisture. Throw away any unused medicine after the expiration date. NOTE: This sheet is a summary. It may not cover all possible information. If you have questions about this medicine, talk to your doctor, pharmacist, or health care provider.    2016, Elsevier/Gold Standard. (2013-02-07  13:11:19)    

## 2015-11-04 ENCOUNTER — Encounter: Payer: Self-pay | Admitting: Internal Medicine

## 2015-11-08 ENCOUNTER — Ambulatory Visit: Payer: Self-pay

## 2015-11-12 ENCOUNTER — Other Ambulatory Visit: Payer: Self-pay | Admitting: Physician Assistant

## 2015-11-15 ENCOUNTER — Encounter: Payer: Self-pay | Admitting: Internal Medicine

## 2015-11-15 ENCOUNTER — Other Ambulatory Visit: Payer: Self-pay | Admitting: Internal Medicine

## 2015-11-15 DIAGNOSIS — M1288 Other specific arthropathies, not elsewhere classified, other specified site: Secondary | ICD-10-CM | POA: Diagnosis not present

## 2015-11-15 DIAGNOSIS — M47812 Spondylosis without myelopathy or radiculopathy, cervical region: Secondary | ICD-10-CM | POA: Diagnosis not present

## 2015-11-15 MED ORDER — AZITHROMYCIN 250 MG PO TABS: ORAL_TABLET | ORAL | Status: DC

## 2015-11-15 MED ORDER — PREDNISONE 20 MG PO TABS: ORAL_TABLET | ORAL | Status: DC

## 2015-11-22 ENCOUNTER — Telehealth: Payer: Self-pay | Admitting: *Deleted

## 2015-11-22 ENCOUNTER — Other Ambulatory Visit: Payer: Self-pay | Admitting: Internal Medicine

## 2015-11-22 DIAGNOSIS — M810 Age-related osteoporosis without current pathological fracture: Secondary | ICD-10-CM

## 2015-11-22 NOTE — Telephone Encounter (Signed)
Patient called and states she has finished her Z-pak and prednisone and still has a stuffy nose.  Per Dr Eddie North, she can try the OTC Flonase nasal spray. Patient advised.

## 2015-11-24 ENCOUNTER — Other Ambulatory Visit: Payer: Self-pay | Admitting: Internal Medicine

## 2015-11-24 MED ORDER — CALCITONIN (SALMON) 200 UNIT/ACT NA SOLN: 1.0000 | Freq: Every day | NASAL | Status: DC

## 2015-11-25 DIAGNOSIS — M47817 Spondylosis without myelopathy or radiculopathy, lumbosacral region: Secondary | ICD-10-CM | POA: Diagnosis not present

## 2015-12-01 ENCOUNTER — Encounter: Payer: Self-pay | Admitting: Internal Medicine

## 2015-12-02 DIAGNOSIS — M542 Cervicalgia: Secondary | ICD-10-CM | POA: Diagnosis not present

## 2015-12-02 DIAGNOSIS — M546 Pain in thoracic spine: Secondary | ICD-10-CM | POA: Diagnosis not present

## 2015-12-02 DIAGNOSIS — M545 Low back pain: Secondary | ICD-10-CM | POA: Diagnosis not present

## 2015-12-06 DIAGNOSIS — M545 Low back pain: Secondary | ICD-10-CM | POA: Diagnosis not present

## 2015-12-06 DIAGNOSIS — M546 Pain in thoracic spine: Secondary | ICD-10-CM | POA: Diagnosis not present

## 2015-12-06 DIAGNOSIS — M542 Cervicalgia: Secondary | ICD-10-CM | POA: Diagnosis not present

## 2015-12-14 DIAGNOSIS — G5621 Lesion of ulnar nerve, right upper limb: Secondary | ICD-10-CM | POA: Diagnosis not present

## 2015-12-14 DIAGNOSIS — G5601 Carpal tunnel syndrome, right upper limb: Secondary | ICD-10-CM | POA: Diagnosis not present

## 2015-12-15 DIAGNOSIS — G5621 Lesion of ulnar nerve, right upper limb: Secondary | ICD-10-CM | POA: Diagnosis not present

## 2015-12-15 DIAGNOSIS — G5601 Carpal tunnel syndrome, right upper limb: Secondary | ICD-10-CM | POA: Diagnosis not present

## 2015-12-16 ENCOUNTER — Encounter: Payer: Self-pay | Admitting: Internal Medicine

## 2015-12-16 ENCOUNTER — Other Ambulatory Visit: Payer: Self-pay | Admitting: Internal Medicine

## 2015-12-16 DIAGNOSIS — M542 Cervicalgia: Secondary | ICD-10-CM | POA: Diagnosis not present

## 2015-12-16 DIAGNOSIS — M545 Low back pain: Secondary | ICD-10-CM | POA: Diagnosis not present

## 2015-12-16 DIAGNOSIS — M546 Pain in thoracic spine: Secondary | ICD-10-CM | POA: Diagnosis not present

## 2015-12-20 ENCOUNTER — Other Ambulatory Visit: Payer: Self-pay | Admitting: Obstetrics and Gynecology

## 2015-12-20 DIAGNOSIS — Z1231 Encounter for screening mammogram for malignant neoplasm of breast: Secondary | ICD-10-CM | POA: Diagnosis not present

## 2015-12-20 DIAGNOSIS — R87615 Unsatisfactory cytologic smear of cervix: Secondary | ICD-10-CM | POA: Diagnosis not present

## 2015-12-22 DIAGNOSIS — M546 Pain in thoracic spine: Secondary | ICD-10-CM | POA: Diagnosis not present

## 2015-12-22 DIAGNOSIS — M545 Low back pain: Secondary | ICD-10-CM | POA: Diagnosis not present

## 2015-12-22 DIAGNOSIS — M542 Cervicalgia: Secondary | ICD-10-CM | POA: Diagnosis not present

## 2015-12-24 LAB — CYTOLOGY - PAP

## 2015-12-30 DIAGNOSIS — M25531 Pain in right wrist: Secondary | ICD-10-CM | POA: Diagnosis not present

## 2015-12-30 DIAGNOSIS — M79601 Pain in right arm: Secondary | ICD-10-CM | POA: Diagnosis not present

## 2015-12-30 DIAGNOSIS — R202 Paresthesia of skin: Secondary | ICD-10-CM | POA: Diagnosis not present

## 2015-12-31 DIAGNOSIS — M546 Pain in thoracic spine: Secondary | ICD-10-CM | POA: Diagnosis not present

## 2015-12-31 DIAGNOSIS — M542 Cervicalgia: Secondary | ICD-10-CM | POA: Diagnosis not present

## 2015-12-31 DIAGNOSIS — M545 Low back pain: Secondary | ICD-10-CM | POA: Diagnosis not present

## 2016-01-03 ENCOUNTER — Encounter: Payer: Self-pay | Admitting: Internal Medicine

## 2016-01-10 DIAGNOSIS — M542 Cervicalgia: Secondary | ICD-10-CM | POA: Diagnosis not present

## 2016-01-10 DIAGNOSIS — M545 Low back pain: Secondary | ICD-10-CM | POA: Diagnosis not present

## 2016-01-10 DIAGNOSIS — M546 Pain in thoracic spine: Secondary | ICD-10-CM | POA: Diagnosis not present

## 2016-01-20 DIAGNOSIS — M545 Low back pain: Secondary | ICD-10-CM | POA: Diagnosis not present

## 2016-01-20 DIAGNOSIS — M546 Pain in thoracic spine: Secondary | ICD-10-CM | POA: Diagnosis not present

## 2016-01-20 DIAGNOSIS — M542 Cervicalgia: Secondary | ICD-10-CM | POA: Diagnosis not present

## 2016-01-26 DIAGNOSIS — G5601 Carpal tunnel syndrome, right upper limb: Secondary | ICD-10-CM | POA: Diagnosis not present

## 2016-01-26 DIAGNOSIS — M24131 Other articular cartilage disorders, right wrist: Secondary | ICD-10-CM | POA: Diagnosis not present

## 2016-01-27 DIAGNOSIS — M546 Pain in thoracic spine: Secondary | ICD-10-CM | POA: Diagnosis not present

## 2016-01-27 DIAGNOSIS — M542 Cervicalgia: Secondary | ICD-10-CM | POA: Diagnosis not present

## 2016-01-27 DIAGNOSIS — M545 Low back pain: Secondary | ICD-10-CM | POA: Diagnosis not present

## 2016-02-03 ENCOUNTER — Encounter: Payer: Self-pay | Admitting: Internal Medicine

## 2016-02-03 DIAGNOSIS — M25531 Pain in right wrist: Secondary | ICD-10-CM | POA: Diagnosis not present

## 2016-02-09 DIAGNOSIS — M546 Pain in thoracic spine: Secondary | ICD-10-CM | POA: Diagnosis not present

## 2016-02-09 DIAGNOSIS — M542 Cervicalgia: Secondary | ICD-10-CM | POA: Diagnosis not present

## 2016-02-09 DIAGNOSIS — M545 Low back pain: Secondary | ICD-10-CM | POA: Diagnosis not present

## 2016-02-10 DIAGNOSIS — M25531 Pain in right wrist: Secondary | ICD-10-CM | POA: Diagnosis not present

## 2016-02-10 DIAGNOSIS — R531 Weakness: Secondary | ICD-10-CM | POA: Diagnosis not present

## 2016-02-16 DIAGNOSIS — R531 Weakness: Secondary | ICD-10-CM | POA: Diagnosis not present

## 2016-02-17 DIAGNOSIS — M542 Cervicalgia: Secondary | ICD-10-CM | POA: Diagnosis not present

## 2016-02-17 DIAGNOSIS — M545 Low back pain: Secondary | ICD-10-CM | POA: Diagnosis not present

## 2016-02-17 DIAGNOSIS — M546 Pain in thoracic spine: Secondary | ICD-10-CM | POA: Diagnosis not present

## 2016-02-24 DIAGNOSIS — M25531 Pain in right wrist: Secondary | ICD-10-CM | POA: Diagnosis not present

## 2016-02-28 DIAGNOSIS — M545 Low back pain: Secondary | ICD-10-CM | POA: Diagnosis not present

## 2016-02-28 DIAGNOSIS — M546 Pain in thoracic spine: Secondary | ICD-10-CM | POA: Diagnosis not present

## 2016-02-28 DIAGNOSIS — M542 Cervicalgia: Secondary | ICD-10-CM | POA: Diagnosis not present

## 2016-03-02 DIAGNOSIS — G5601 Carpal tunnel syndrome, right upper limb: Secondary | ICD-10-CM | POA: Diagnosis not present

## 2016-03-02 DIAGNOSIS — M24131 Other articular cartilage disorders, right wrist: Secondary | ICD-10-CM | POA: Diagnosis not present

## 2016-03-02 DIAGNOSIS — R531 Weakness: Secondary | ICD-10-CM | POA: Diagnosis not present

## 2016-03-02 DIAGNOSIS — M25531 Pain in right wrist: Secondary | ICD-10-CM | POA: Diagnosis not present

## 2016-03-06 DIAGNOSIS — M1288 Other specific arthropathies, not elsewhere classified, other specified site: Secondary | ICD-10-CM | POA: Diagnosis not present

## 2016-03-06 DIAGNOSIS — M47812 Spondylosis without myelopathy or radiculopathy, cervical region: Secondary | ICD-10-CM | POA: Diagnosis not present

## 2016-03-14 DIAGNOSIS — M545 Low back pain: Secondary | ICD-10-CM | POA: Diagnosis not present

## 2016-03-14 DIAGNOSIS — M542 Cervicalgia: Secondary | ICD-10-CM | POA: Diagnosis not present

## 2016-03-14 DIAGNOSIS — M546 Pain in thoracic spine: Secondary | ICD-10-CM | POA: Diagnosis not present

## 2016-03-16 DIAGNOSIS — M47812 Spondylosis without myelopathy or radiculopathy, cervical region: Secondary | ICD-10-CM | POA: Diagnosis not present

## 2016-03-22 DIAGNOSIS — M24131 Other articular cartilage disorders, right wrist: Secondary | ICD-10-CM | POA: Diagnosis not present

## 2016-03-22 DIAGNOSIS — G5621 Lesion of ulnar nerve, right upper limb: Secondary | ICD-10-CM | POA: Diagnosis not present

## 2016-03-30 DIAGNOSIS — M438X6 Other specified deforming dorsopathies, lumbar region: Secondary | ICD-10-CM | POA: Diagnosis not present

## 2016-03-30 DIAGNOSIS — M47816 Spondylosis without myelopathy or radiculopathy, lumbar region: Secondary | ICD-10-CM | POA: Diagnosis not present

## 2016-03-30 DIAGNOSIS — M545 Low back pain: Secondary | ICD-10-CM | POA: Diagnosis not present

## 2016-03-30 DIAGNOSIS — M1288 Other specific arthropathies, not elsewhere classified, other specified site: Secondary | ICD-10-CM | POA: Diagnosis not present

## 2016-04-13 DIAGNOSIS — J322 Chronic ethmoidal sinusitis: Secondary | ICD-10-CM | POA: Diagnosis not present

## 2016-04-13 DIAGNOSIS — J040 Acute laryngitis: Secondary | ICD-10-CM | POA: Diagnosis not present

## 2016-04-13 DIAGNOSIS — J320 Chronic maxillary sinusitis: Secondary | ICD-10-CM | POA: Diagnosis not present

## 2016-04-13 DIAGNOSIS — J301 Allergic rhinitis due to pollen: Secondary | ICD-10-CM | POA: Diagnosis not present

## 2016-05-02 DIAGNOSIS — J320 Chronic maxillary sinusitis: Secondary | ICD-10-CM | POA: Diagnosis not present

## 2016-05-02 DIAGNOSIS — J322 Chronic ethmoidal sinusitis: Secondary | ICD-10-CM | POA: Diagnosis not present

## 2016-05-02 DIAGNOSIS — J040 Acute laryngitis: Secondary | ICD-10-CM | POA: Diagnosis not present

## 2016-05-02 DIAGNOSIS — J323 Chronic sphenoidal sinusitis: Secondary | ICD-10-CM | POA: Diagnosis not present

## 2016-05-03 ENCOUNTER — Encounter: Payer: Self-pay | Admitting: Internal Medicine

## 2016-05-03 ENCOUNTER — Ambulatory Visit (INDEPENDENT_AMBULATORY_CARE_PROVIDER_SITE_OTHER): Payer: BLUE CROSS/BLUE SHIELD | Admitting: Internal Medicine

## 2016-05-03 VITALS — BP 126/84 | HR 70 | Temp 97.8°F | Resp 16 | Ht 61.0 in | Wt 94.0 lb

## 2016-05-03 DIAGNOSIS — Z79899 Other long term (current) drug therapy: Secondary | ICD-10-CM | POA: Diagnosis not present

## 2016-05-03 DIAGNOSIS — Z130 Encounter for screening for diseases of the blood and blood-forming organs and certain disorders involving the immune mechanism: Secondary | ICD-10-CM

## 2016-05-03 DIAGNOSIS — E039 Hypothyroidism, unspecified: Secondary | ICD-10-CM

## 2016-05-03 DIAGNOSIS — E559 Vitamin D deficiency, unspecified: Secondary | ICD-10-CM | POA: Diagnosis not present

## 2016-05-03 DIAGNOSIS — Z0001 Encounter for general adult medical examination with abnormal findings: Secondary | ICD-10-CM

## 2016-05-03 DIAGNOSIS — Z0000 Encounter for general adult medical examination without abnormal findings: Secondary | ICD-10-CM

## 2016-05-03 DIAGNOSIS — Z1389 Encounter for screening for other disorder: Secondary | ICD-10-CM

## 2016-05-03 DIAGNOSIS — E782 Mixed hyperlipidemia: Secondary | ICD-10-CM

## 2016-05-03 DIAGNOSIS — J324 Chronic pansinusitis: Secondary | ICD-10-CM

## 2016-05-03 DIAGNOSIS — R7303 Prediabetes: Secondary | ICD-10-CM

## 2016-05-03 DIAGNOSIS — Z136 Encounter for screening for cardiovascular disorders: Secondary | ICD-10-CM

## 2016-05-03 DIAGNOSIS — G43809 Other migraine, not intractable, without status migrainosus: Secondary | ICD-10-CM

## 2016-05-03 LAB — HEPATIC FUNCTION PANEL
ALK PHOS: 27 U/L — AB (ref 33–130)
ALT: 19 U/L (ref 6–29)
AST: 36 U/L — AB (ref 10–35)
Albumin: 4.8 g/dL (ref 3.6–5.1)
BILIRUBIN DIRECT: 0.1 mg/dL (ref <=0.2)
BILIRUBIN TOTAL: 0.4 mg/dL (ref 0.2–1.2)
Indirect Bilirubin: 0.3 mg/dL (ref 0.2–1.2)
Total Protein: 7.1 g/dL (ref 6.1–8.1)

## 2016-05-03 LAB — LIPID PANEL
CHOL/HDL RATIO: 2.4 ratio (ref <=5.0)
Cholesterol: 187 mg/dL (ref 125–200)
HDL: 79 mg/dL (ref >=46)
LDL CALC: 82 mg/dL (ref <130)
Triglycerides: 131 mg/dL (ref <150)
VLDL: 26 mg/dL (ref <30)

## 2016-05-03 LAB — CBC WITH DIFFERENTIAL/PLATELET
BASOS ABS: 0 {cells}/uL (ref 0–200)
BASOS PCT: 0 %
EOS ABS: 64 {cells}/uL (ref 15–500)
EOS PCT: 1 %
HCT: 40.6 % (ref 35.0–45.0)
HEMOGLOBIN: 13.7 g/dL (ref 11.7–15.5)
LYMPHS ABS: 2176 {cells}/uL (ref 850–3900)
Lymphocytes Relative: 34 %
MCH: 29 pg (ref 27.0–33.0)
MCHC: 33.7 g/dL (ref 32.0–36.0)
MCV: 86 fL (ref 80.0–100.0)
MPV: 9.9 fL (ref 7.5–12.5)
Monocytes Absolute: 576 cells/uL (ref 200–950)
Monocytes Relative: 9 %
NEUTROS ABS: 3584 {cells}/uL (ref 1500–7800)
Neutrophils Relative %: 56 %
PLATELETS: 260 10*3/uL (ref 140–400)
RBC: 4.72 MIL/uL (ref 3.80–5.10)
RDW: 14 % (ref 11.0–15.0)
WBC: 6.4 10*3/uL (ref 3.8–10.8)

## 2016-05-03 LAB — IRON AND TIBC
%SAT: 14 % (ref 11–50)
Iron: 54 ug/dL (ref 45–160)
TIBC: 387 ug/dL (ref 250–450)
UIBC: 333 ug/dL (ref 125–400)

## 2016-05-03 LAB — BASIC METABOLIC PANEL WITH GFR
BUN: 15 mg/dL (ref 7–25)
CHLORIDE: 101 mmol/L (ref 98–110)
CO2: 29 mmol/L (ref 20–31)
CREATININE: 0.99 mg/dL (ref 0.50–0.99)
Calcium: 10 mg/dL (ref 8.6–10.4)
GFR, Est African American: 71 mL/min (ref >=60)
GFR, Est Non African American: 62 mL/min (ref >=60)
Glucose, Bld: 81 mg/dL (ref 65–99)
POTASSIUM: 4 mmol/L (ref 3.5–5.3)
SODIUM: 141 mmol/L (ref 135–146)

## 2016-05-03 LAB — TSH: TSH: 1.44 m[IU]/L

## 2016-05-03 LAB — MAGNESIUM: MAGNESIUM: 2 mg/dL (ref 1.5–2.5)

## 2016-05-03 MED ORDER — ALENDRONATE SODIUM 70 MG PO TABS: 70.0000 mg | ORAL_TABLET | ORAL | 11 refills | Status: DC

## 2016-05-03 NOTE — Patient Instructions (Signed)
Dr. Brigid Re [facility]
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
## 2016-05-04 LAB — URINALYSIS, ROUTINE W REFLEX MICROSCOPIC
BILIRUBIN URINE: NEGATIVE
Glucose, UA: NEGATIVE
Hgb urine dipstick: NEGATIVE
LEUKOCYTES UA: NEGATIVE
NITRITE: NEGATIVE
PROTEIN: NEGATIVE
SPECIFIC GRAVITY, URINE: 1.013 (ref 1.001–1.035)
pH: 7 (ref 5.0–8.0)

## 2016-05-04 LAB — VITAMIN D 25 HYDROXY (VIT D DEFICIENCY, FRACTURES): VIT D 25 HYDROXY: 56 ng/mL (ref 30–100)

## 2016-05-04 LAB — VITAMIN B12: VITAMIN B 12: 721 pg/mL (ref 200–1100)

## 2016-05-04 LAB — MICROALBUMIN / CREATININE URINE RATIO
CREATININE, URINE: 128 mg/dL (ref 20–320)
MICROALB UR: 2.7 mg/dL
Microalb Creat Ratio: 21 mcg/mg creat (ref <30)

## 2016-05-04 LAB — INSULIN, RANDOM: Insulin: 1.7 u[IU]/mL — ABNORMAL LOW (ref 2.0–19.6)

## 2016-05-04 LAB — HEMOGLOBIN A1C
HEMOGLOBIN A1C: 5.2 % (ref <5.7)
MEAN PLASMA GLUCOSE: 103 mg/dL

## 2016-05-11 DIAGNOSIS — M47816 Spondylosis without myelopathy or radiculopathy, lumbar region: Secondary | ICD-10-CM | POA: Diagnosis not present

## 2016-05-18 DIAGNOSIS — J301 Allergic rhinitis due to pollen: Secondary | ICD-10-CM | POA: Diagnosis not present

## 2016-05-23 DIAGNOSIS — G43009 Migraine without aura, not intractable, without status migrainosus: Secondary | ICD-10-CM | POA: Diagnosis not present

## 2016-05-23 DIAGNOSIS — G4700 Insomnia, unspecified: Secondary | ICD-10-CM | POA: Diagnosis not present

## 2016-05-25 DIAGNOSIS — M1288 Other specific arthropathies, not elsewhere classified, other specified site: Secondary | ICD-10-CM | POA: Diagnosis not present

## 2016-05-25 DIAGNOSIS — M47817 Spondylosis without myelopathy or radiculopathy, lumbosacral region: Secondary | ICD-10-CM | POA: Diagnosis not present

## 2016-05-26 ENCOUNTER — Other Ambulatory Visit: Payer: Self-pay | Admitting: Internal Medicine

## 2016-06-15 ENCOUNTER — Other Ambulatory Visit: Payer: Self-pay | Admitting: Internal Medicine

## 2016-06-19 DIAGNOSIS — M545 Low back pain: Secondary | ICD-10-CM | POA: Diagnosis not present

## 2016-06-19 DIAGNOSIS — M542 Cervicalgia: Secondary | ICD-10-CM | POA: Diagnosis not present

## 2016-06-20 ENCOUNTER — Ambulatory Visit (INDEPENDENT_AMBULATORY_CARE_PROVIDER_SITE_OTHER): Payer: BLUE CROSS/BLUE SHIELD | Admitting: Physician Assistant

## 2016-06-20 VITALS — BP 126/78 | HR 76 | Temp 97.5°F | Resp 16 | Ht 61.0 in | Wt 100.2 lb

## 2016-06-20 DIAGNOSIS — J3489 Other specified disorders of nose and nasal sinuses: Secondary | ICD-10-CM

## 2016-06-20 MED ORDER — AMOXICILLIN-POT CLAVULANATE 875-125 MG PO TABS: 1.0000 | ORAL_TABLET | Freq: Two times a day (BID) | ORAL | 0 refills | Status: DC

## 2016-06-20 NOTE — Progress Notes (Signed)
Subjective:    Patient ID: Isabel [name], female    DOB: April 30, 1956, 60 y.o.   MRN: [id number]  HPI 60 y.o. female presents for sinus infection. She was having teeth pain, saw dentist and she was okay, then saw Dr. Claria Dice given zpak and then given another antibiotic, she was okay for a few days but still had it. She went back and she continues to have pain but Dr. Claria Dice said that she does not have sinus infection anymore. She has had migraine x 2 days. She has nasal congestion when she wakes up, throat clearing. No fever or chills.   Has been stressed with her mother having a stroke and now in an assisted living.   Blood pressure 126/78, pulse 76, temperature 97.5 F (36.4 C), resp. rate 16, height 5\' 1"  (1.549 m), weight 100 lb 3.2 oz (45.5 kg).  Medications Current Outpatient Prescriptions on File Prior to Visit  Medication Sig  . alendronate (FOSAMAX) 70 MG tablet Take 1 tablet (70 mg total) by mouth every 7 (seven) days. Take with a full glass of water on an empty stomach.  Marland Kitchen amLODipine (NORVASC) 5 MG tablet take 1 tablet by mouth once daily  . aspirin 81 MG tablet Take 162 mg by mouth daily.   Marland Kitchen azelastine (ASTELIN) 0.1 % nasal spray Place 2 sprays into both nostrils 2 (two) times daily as needed for rhinitis (Use in each nostril as directed).  . Calcium Carbonate-Vit D-Min (CALCIUM 1200 PO) Take 1 tablet by mouth daily.  . cholecalciferol (VITAMIN D) 1000 UNITS tablet Take 2,000 Units by mouth daily.   . Cyanocobalamin (VITAMIN B 12 PO) Take by mouth daily.  . diazepam (VALIUM) 10 MG tablet Take 10 mg by mouth at bedtime as needed for anxiety.  . Flaxseed, Linseed, (FLAX SEEDS PO) Take by mouth daily.  Marland Kitchen levothyroxine (SYNTHROID) 25 MCG tablet Take 1 tablet (25 mcg total) by mouth daily.  . magnesium gluconate (MAGONATE) 500 MG tablet Take 500 mg by mouth every other day.   Marland Kitchen OVER THE COUNTER MEDICATION Takes folic acid 1 tab daily.  Marland Kitchen OVER THE COUNTER MEDICATION Vitamin B 6 1  daily  . Riboflavin 100 MG CAPS Take 200 mg by mouth daily.  . rosuvastatin (CRESTOR) 10 MG tablet take 1 tablet by mouth once daily  . SUMAtriptan 6 MG/0.5ML SOAJ   . ZOMIG 2.5 MG SOLN    No current facility-administered medications on file prior to visit.     Problem list She has Recurrent acute sinusitis; Dyspnea; Migraines; Thyroid disease; Hyperlipidemia; GERD (gastroesophageal reflux disease); Cervical spondylosis without myelopathy; Headache, migraine; Elevated BP; Prediabetes; Vitamin D deficiency; and Medication management on her problem list.   Review of Systems  Constitutional: Negative.  Negative for chills and fever.  HENT: Positive for congestion, dental problem, postnasal drip and sinus pain. Negative for drooling, ear pain, facial swelling, sinus pressure, sneezing and sore throat.   Respiratory: Negative.   Cardiovascular: Negative.        Objective:   Physical Exam  Constitutional: She is oriented to person, place, and time. She appears well-developed and well-nourished.  HENT:  Head: Normocephalic and atraumatic.  Right Ear: Hearing and external ear normal. No mastoid tenderness. Tympanic membrane is not injected, not erythematous, not retracted and not bulging. A middle ear effusion is present.  Left Ear: Hearing and external ear normal. No mastoid tenderness. Tympanic membrane is not injected, not erythematous, not retracted and not bulging. A middle ear effusion  is present.  + TMJ  Eyes: Conjunctivae are normal. Pupils are equal, round, and reactive to light.  Neck: Normal range of motion. Neck supple.  Cardiovascular: Normal rate.   Pulmonary/Chest: Effort normal and breath sounds normal.  Musculoskeletal: Normal range of motion.  Neurological: She is alert and oriented to person, place, and time.  Skin: Skin is warm and dry.       Assessment & Plan:   1. Sinus pain ? Infection versus TMJ Will treat both Augmentin, if not better will get CT  sinuses Continue valium, decrease clinching due to stress of mother with stroke, heating pad, soft foods, exercises given  Future Appointments Date Time Provider [place]  11/08/2016 2:30 PM Unk Pinto, MD [facility] None  05/07/2017 2:00 PM Courtney Forcucci, PA-C [facility] None  Comes every 6 months

## 2016-06-20 NOTE — Patient Instructions (Addendum)
Do softer foods, heating pad on face, try to not clinch during the day  What is the TMJ? The temporomandibular (tem-[id number]-man-DIB-[id number]-ler) joint, or the TMJ, connects the upper and lower jawbones. This joint allows the jaw to open wide and move back and forth when you chew, talk, or yawn.There are also several muscles that help this joint move. There can be muscle tightness and pain in the muscle that can cause several symptoms.  What causes TMJ pain? There are many causes of TMJ pain. Repeated chewing (for example, chewing gum) and clenching your teeth can cause pain in the joint. Some TMJ pain has no obvious cause. What can I do to ease the pain? There are many things you can do to help your pain get better. When you have pain:  Eat soft foods and stay away from chewy foods (for example, taffy) Try to use both sides of your mouth to chew Don't chew gum Massage Don't open your mouth wide (for example, during yawning or singing) Don't bite your cheeks or fingernails Lower your amount of stress and worry Applying a warm, damp washcloth to the joint may help. Over-the-counter pain medicines such as ibuprofen (one brand: Advil) or acetaminophen (one brand: Tylenol) might also help. Do not use these medicines if you are allergic to them or if your doctor told you not to use them. How can I stop the pain from coming back? When your pain is better, you can do these exercises to make your muscles stronger and to keep the pain from coming back:  Resisted mouth opening: Place your thumb or two fingers under your chin and open your mouth slowly, pushing up lightly on your chin with your thumb. Hold for three to six seconds. Close your mouth slowly. Resisted mouth closing: Place your thumbs under your chin and your two index fingers on the ridge between your mouth and the bottom of your chin. Push down lightly on your chin as you close your mouth. Tongue up: Slowly open and close your mouth while keeping  the tongue touching the roof of the mouth. Side-to-side jaw movement: Place an object about one fourth of an inch thick (for example, two tongue depressors) between your front teeth. Slowly move your jaw from side to side. Increase the thickness of the object as the exercise becomes easier Forward jaw movement: Place an object about one fourth of an inch thick between your front teeth and move the bottom jaw forward so that the bottom teeth are in front of the top teeth. Increase the thickness of the object as the exercise becomes easier. These exercises should not be painful. If it hurts to do these exercises, stop doing them and talk to your family doctor.

## 2016-06-26 DIAGNOSIS — M542 Cervicalgia: Secondary | ICD-10-CM | POA: Diagnosis not present

## 2016-06-26 DIAGNOSIS — M545 Low back pain: Secondary | ICD-10-CM | POA: Diagnosis not present

## 2016-06-28 DIAGNOSIS — G43009 Migraine without aura, not intractable, without status migrainosus: Secondary | ICD-10-CM | POA: Diagnosis not present

## 2016-06-28 DIAGNOSIS — E7800 Pure hypercholesterolemia, unspecified: Secondary | ICD-10-CM | POA: Diagnosis not present

## 2016-06-28 DIAGNOSIS — M47812 Spondylosis without myelopathy or radiculopathy, cervical region: Secondary | ICD-10-CM | POA: Diagnosis not present

## 2016-06-28 DIAGNOSIS — M1288 Other specific arthropathies, not elsewhere classified, other specified site: Secondary | ICD-10-CM | POA: Diagnosis not present

## 2016-07-03 DIAGNOSIS — M545 Low back pain: Secondary | ICD-10-CM | POA: Diagnosis not present

## 2016-07-03 DIAGNOSIS — M542 Cervicalgia: Secondary | ICD-10-CM | POA: Diagnosis not present

## 2016-07-04 ENCOUNTER — Telehealth: Payer: Self-pay

## 2016-07-04 DIAGNOSIS — J329 Chronic sinusitis, unspecified: Secondary | ICD-10-CM

## 2016-07-04 NOTE — Telephone Encounter (Signed)
Pt called stating that she she was still having sinus pain & migraines, wanting to know what to do about issues.   Per provider pt can see Dr. Ernesto Rutherford again or can be referred to [facility].  Pt agreed to referral to ENT.

## 2016-07-05 NOTE — Addendum Note (Signed)
Addended by: Vicie Mutters R on: 07/05/2016 07:44 AM   Modules accepted: Orders

## 2016-07-05 NOTE — Telephone Encounter (Signed)
Patient has had chronic sinusitis versus TMJ, has been on several rounds of antibiotics and is wanting second opinion, will refer to [facility].

## 2016-07-06 DIAGNOSIS — G43809 Other migraine, not intractable, without status migrainosus: Secondary | ICD-10-CM | POA: Diagnosis not present

## 2016-07-06 DIAGNOSIS — J019 Acute sinusitis, unspecified: Secondary | ICD-10-CM | POA: Diagnosis not present

## 2016-07-06 DIAGNOSIS — J342 Deviated nasal septum: Secondary | ICD-10-CM | POA: Diagnosis not present

## 2016-07-06 DIAGNOSIS — J343 Hypertrophy of nasal turbinates: Secondary | ICD-10-CM | POA: Diagnosis not present

## 2016-07-10 DIAGNOSIS — M1288 Other specific arthropathies, not elsewhere classified, other specified site: Secondary | ICD-10-CM | POA: Diagnosis not present

## 2016-07-10 DIAGNOSIS — M47812 Spondylosis without myelopathy or radiculopathy, cervical region: Secondary | ICD-10-CM | POA: Diagnosis not present

## 2016-07-11 DIAGNOSIS — M542 Cervicalgia: Secondary | ICD-10-CM | POA: Diagnosis not present

## 2016-07-11 DIAGNOSIS — M545 Low back pain: Secondary | ICD-10-CM | POA: Diagnosis not present

## 2016-07-20 DIAGNOSIS — J019 Acute sinusitis, unspecified: Secondary | ICD-10-CM | POA: Diagnosis not present

## 2016-07-20 DIAGNOSIS — G43809 Other migraine, not intractable, without status migrainosus: Secondary | ICD-10-CM | POA: Diagnosis not present

## 2016-07-20 DIAGNOSIS — J342 Deviated nasal septum: Secondary | ICD-10-CM | POA: Diagnosis not present

## 2016-07-20 DIAGNOSIS — J343 Hypertrophy of nasal turbinates: Secondary | ICD-10-CM | POA: Diagnosis not present

## 2016-08-01 DIAGNOSIS — J0191 Acute recurrent sinusitis, unspecified: Secondary | ICD-10-CM | POA: Diagnosis not present

## 2016-08-01 DIAGNOSIS — J343 Hypertrophy of nasal turbinates: Secondary | ICD-10-CM | POA: Diagnosis not present

## 2016-08-01 DIAGNOSIS — J342 Deviated nasal septum: Secondary | ICD-10-CM | POA: Diagnosis not present

## 2016-08-01 DIAGNOSIS — G43809 Other migraine, not intractable, without status migrainosus: Secondary | ICD-10-CM | POA: Diagnosis not present

## 2016-08-10 DIAGNOSIS — M545 Low back pain: Secondary | ICD-10-CM | POA: Diagnosis not present

## 2016-08-10 DIAGNOSIS — M542 Cervicalgia: Secondary | ICD-10-CM | POA: Diagnosis not present

## 2016-08-18 DIAGNOSIS — M545 Low back pain: Secondary | ICD-10-CM | POA: Diagnosis not present

## 2016-08-18 DIAGNOSIS — M542 Cervicalgia: Secondary | ICD-10-CM | POA: Diagnosis not present

## 2016-08-21 DIAGNOSIS — J0191 Acute recurrent sinusitis, unspecified: Secondary | ICD-10-CM | POA: Diagnosis not present

## 2016-08-21 DIAGNOSIS — J019 Acute sinusitis, unspecified: Secondary | ICD-10-CM | POA: Diagnosis not present

## 2016-08-22 DIAGNOSIS — M545 Low back pain: Secondary | ICD-10-CM | POA: Diagnosis not present

## 2016-08-22 DIAGNOSIS — M542 Cervicalgia: Secondary | ICD-10-CM | POA: Diagnosis not present

## 2016-08-23 DIAGNOSIS — J342 Deviated nasal septum: Secondary | ICD-10-CM | POA: Diagnosis not present

## 2016-08-23 DIAGNOSIS — G43809 Other migraine, not intractable, without status migrainosus: Secondary | ICD-10-CM | POA: Diagnosis not present

## 2016-08-23 DIAGNOSIS — J0191 Acute recurrent sinusitis, unspecified: Secondary | ICD-10-CM | POA: Diagnosis not present

## 2016-08-23 DIAGNOSIS — J343 Hypertrophy of nasal turbinates: Secondary | ICD-10-CM | POA: Diagnosis not present

## 2016-08-29 DIAGNOSIS — M545 Low back pain: Secondary | ICD-10-CM | POA: Diagnosis not present

## 2016-08-29 DIAGNOSIS — M542 Cervicalgia: Secondary | ICD-10-CM | POA: Diagnosis not present

## 2016-09-08 DIAGNOSIS — M545 Low back pain: Secondary | ICD-10-CM | POA: Diagnosis not present

## 2016-09-08 DIAGNOSIS — M542 Cervicalgia: Secondary | ICD-10-CM | POA: Diagnosis not present

## 2016-09-12 DIAGNOSIS — M542 Cervicalgia: Secondary | ICD-10-CM | POA: Diagnosis not present

## 2016-09-12 DIAGNOSIS — M545 Low back pain: Secondary | ICD-10-CM | POA: Diagnosis not present

## 2016-09-18 DIAGNOSIS — G43009 Migraine without aura, not intractable, without status migrainosus: Secondary | ICD-10-CM | POA: Diagnosis not present

## 2016-09-18 DIAGNOSIS — M47812 Spondylosis without myelopathy or radiculopathy, cervical region: Secondary | ICD-10-CM | POA: Diagnosis not present

## 2016-10-04 DIAGNOSIS — J342 Deviated nasal septum: Secondary | ICD-10-CM | POA: Diagnosis not present

## 2016-10-04 DIAGNOSIS — J343 Hypertrophy of nasal turbinates: Secondary | ICD-10-CM | POA: Diagnosis not present

## 2016-10-04 DIAGNOSIS — G43809 Other migraine, not intractable, without status migrainosus: Secondary | ICD-10-CM | POA: Diagnosis not present

## 2016-10-11 ENCOUNTER — Encounter: Payer: Self-pay | Admitting: Internal Medicine

## 2016-10-12 ENCOUNTER — Encounter: Payer: Self-pay | Admitting: Internal Medicine

## 2016-10-13 ENCOUNTER — Other Ambulatory Visit: Payer: Self-pay | Admitting: Internal Medicine

## 2016-10-13 ENCOUNTER — Encounter: Payer: Self-pay | Admitting: Internal Medicine

## 2016-10-13 DIAGNOSIS — M810 Age-related osteoporosis without current pathological fracture: Secondary | ICD-10-CM

## 2016-11-08 ENCOUNTER — Encounter: Payer: Self-pay | Admitting: Internal Medicine

## 2016-11-08 ENCOUNTER — Ambulatory Visit (INDEPENDENT_AMBULATORY_CARE_PROVIDER_SITE_OTHER): Payer: BLUE CROSS/BLUE SHIELD | Admitting: Internal Medicine

## 2016-11-08 VITALS — BP 140/84 | HR 80 | Temp 97.3°F | Resp 16 | Ht 61.0 in | Wt 103.0 lb

## 2016-11-08 DIAGNOSIS — E039 Hypothyroidism, unspecified: Secondary | ICD-10-CM

## 2016-11-08 DIAGNOSIS — K219 Gastro-esophageal reflux disease without esophagitis: Secondary | ICD-10-CM | POA: Diagnosis not present

## 2016-11-08 DIAGNOSIS — E782 Mixed hyperlipidemia: Secondary | ICD-10-CM | POA: Diagnosis not present

## 2016-11-08 DIAGNOSIS — I10 Essential (primary) hypertension: Secondary | ICD-10-CM | POA: Diagnosis not present

## 2016-11-08 DIAGNOSIS — R0989 Other specified symptoms and signs involving the circulatory and respiratory systems: Secondary | ICD-10-CM

## 2016-11-08 DIAGNOSIS — R7303 Prediabetes: Secondary | ICD-10-CM | POA: Diagnosis not present

## 2016-11-08 DIAGNOSIS — E559 Vitamin D deficiency, unspecified: Secondary | ICD-10-CM

## 2016-11-08 DIAGNOSIS — Z79899 Other long term (current) drug therapy: Secondary | ICD-10-CM | POA: Diagnosis not present

## 2016-11-08 DIAGNOSIS — G43809 Other migraine, not intractable, without status migrainosus: Secondary | ICD-10-CM | POA: Diagnosis not present

## 2016-11-08 LAB — TSH: TSH: 1.62 mIU/L

## 2016-11-08 LAB — CBC WITH DIFFERENTIAL/PLATELET
BASOS PCT: 0 %
Basophils Absolute: 0 cells/uL (ref 0–200)
EOS PCT: 1 %
Eosinophils Absolute: 67 cells/uL (ref 15–500)
HEMATOCRIT: 40.3 % (ref 35.0–45.0)
HEMOGLOBIN: 12.9 g/dL (ref 11.7–15.5)
LYMPHS ABS: 2345 {cells}/uL (ref 850–3900)
Lymphocytes Relative: 35 %
MCH: 27.9 pg (ref 27.0–33.0)
MCHC: 32 g/dL (ref 32.0–36.0)
MCV: 87 fL (ref 80.0–100.0)
MONO ABS: 603 {cells}/uL (ref 200–950)
MPV: 10.3 fL (ref 7.5–12.5)
Monocytes Relative: 9 %
NEUTROS ABS: 3685 {cells}/uL (ref 1500–7800)
Neutrophils Relative %: 55 %
Platelets: 262 10*3/uL (ref 140–400)
RBC: 4.63 MIL/uL (ref 3.80–5.10)
RDW: 14.5 % (ref 11.0–15.0)
WBC: 6.7 10*3/uL (ref 3.8–10.8)

## 2016-11-08 LAB — HEPATIC FUNCTION PANEL
ALT: 12 U/L (ref 6–29)
AST: 25 U/L (ref 10–35)
Albumin: 4.2 g/dL (ref 3.6–5.1)
Alkaline Phosphatase: 23 U/L — ABNORMAL LOW (ref 33–130)
BILIRUBIN DIRECT: 0.1 mg/dL (ref <=0.2)
BILIRUBIN INDIRECT: 0.3 mg/dL (ref 0.2–1.2)
Total Bilirubin: 0.4 mg/dL (ref 0.2–1.2)
Total Protein: 6.5 g/dL (ref 6.1–8.1)

## 2016-11-08 LAB — LIPID PANEL
CHOL/HDL RATIO: 2.1 ratio (ref <5.0)
CHOLESTEROL: 167 mg/dL (ref <200)
HDL: 81 mg/dL (ref >50)
LDL Cholesterol: 69 mg/dL (ref <100)
TRIGLYCERIDES: 84 mg/dL (ref <150)
VLDL: 17 mg/dL (ref <30)

## 2016-11-08 LAB — BASIC METABOLIC PANEL WITH GFR
BUN: 11 mg/dL (ref 7–25)
CALCIUM: 10.1 mg/dL (ref 8.6–10.4)
CO2: 28 mmol/L (ref 20–31)
Chloride: 104 mmol/L (ref 98–110)
Creat: 1.04 mg/dL — ABNORMAL HIGH (ref 0.50–0.99)
GFR, EST AFRICAN AMERICAN: 67 mL/min (ref >=60)
GFR, EST NON AFRICAN AMERICAN: 58 mL/min — AB (ref >=60)
Glucose, Bld: 84 mg/dL (ref 65–99)
Potassium: 3.9 mmol/L (ref 3.5–5.3)
SODIUM: 141 mmol/L (ref 135–146)
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
## 2016-11-08 NOTE — Patient Instructions (Signed)

## 2016-11-09 LAB — VITAMIN D 25 HYDROXY (VIT D DEFICIENCY, FRACTURES): VIT D 25 HYDROXY: 67 ng/mL (ref 30–100)

## 2016-11-09 LAB — HEMOGLOBIN A1C
HEMOGLOBIN A1C: 5.2 % (ref <5.7)
MEAN PLASMA GLUCOSE: 103 mg/dL

## 2016-11-09 LAB — INSULIN, RANDOM: Insulin: 2.8 u[IU]/mL (ref 2.0–19.6)

## 2016-11-09 LAB — MAGNESIUM: MAGNESIUM: 1.9 mg/dL (ref 1.5–2.5)

## 2016-11-21 ENCOUNTER — Ambulatory Visit (INDEPENDENT_AMBULATORY_CARE_PROVIDER_SITE_OTHER): Payer: BLUE CROSS/BLUE SHIELD | Admitting: *Deleted

## 2016-11-21 DIAGNOSIS — Z23 Encounter for immunization: Secondary | ICD-10-CM | POA: Diagnosis not present

## 2016-11-21 DIAGNOSIS — G43009 Migraine without aura, not intractable, without status migrainosus: Secondary | ICD-10-CM | POA: Diagnosis not present

## 2016-11-23 ENCOUNTER — Encounter: Payer: Self-pay | Admitting: Internal Medicine

## 2016-11-23 ENCOUNTER — Ambulatory Visit (INDEPENDENT_AMBULATORY_CARE_PROVIDER_SITE_OTHER): Payer: BLUE CROSS/BLUE SHIELD | Admitting: Internal Medicine

## 2016-11-23 VITALS — BP 112/62 | HR 83 | Ht 61.0 in | Wt 102.6 lb

## 2016-11-23 DIAGNOSIS — M810 Age-related osteoporosis without current pathological fracture: Secondary | ICD-10-CM

## 2016-11-23 NOTE — Patient Instructions (Addendum)
Please stop at the lab.  Try [employer].  Send me a message through [place] before next visit so I can order your next DEXA.   Please return in 1 year.  How Can I Prevent Falls? Men and women with osteoporosis need to take care not to fall down. Falls can break bones. Some reasons people fall are: Poor vision  Poor balance  Certain diseases that affect how you walk  Some types of medicine, such as sleeping pills.  Some tips to help prevent falls outdoors are: Use a cane or walker  Wear rubber-soled shoes so you don't slip  Walk on grass when sidewalks are slippery  In winter, put salt or kitty litter on icy sidewalks.  Some ways to help prevent falls indoors are: Keep rooms free of clutter, especially on floors  Use plastic or carpet runners on slippery floors  Wear low-heeled shoes that provide good support  Do not walk in socks, stockings, or slippers  Be sure carpets and area rugs have skid-proof backs or are tacked to the floor  Be sure stairs are well lit and have rails on both sides  Put grab bars on bathroom walls near tub, shower, and toilet  Use a rubber bath mat in the shower or tub  Keep a flashlight next to your bed  Use a sturdy step stool with a handrail and wide steps  Add more lights in rooms (and night lights) Buy a cordless phone to keep with you so that you don't have to rush to the phone       when it rings and so that you can call for help if you fall.   (adapted from http://www.niams.NightlifePreviews.se)  Dietary sources of calcium and vitamin D:  Calcium content (mg) - http://www.niams.MoviePins.co.za  Fortified oatmeal, 1 packet 350  Sardines, canned in oil, with edible bones, 3 oz. 324  Cheddar cheese, 1 oz. shredded 306  Milk, nonfat, 1 cup 302  Milkshake, 1 cup 300  Yogurt, plain, low-fat, 1 cup 300  Soybeans, cooked, 1 cup 261  Tofu, firm, with calcium,  cup 204  Orange juice,  fortified with calcium, 6 oz. 200-260 (varies)  Salmon, canned, with edible bones, 3 oz. 181  Pudding, instant, made with 2% milk,  cup 153  Baked beans, 1 cup [place], 1% milk fat, 1 cup 138  Spaghetti, lasagna, 1 cup 125  Frozen yogurt, vanilla, soft-serve,  cup 103  Ready-to-eat cereal, fortified with calcium, 1 cup 100-1,000 (varies)  Cheese pizza, 1 slice 676  Fortified waffles, 2 100  Turnip greens, boiled,  cup 99  Broccoli, raw, 1 cup 90  Ice cream, vanilla,  cup 85  Soy or rice milk, fortified with calcium, 1 cup 80-500 (varies)   Vitamin D content (International Units, IU) - https://www.ars.usda.gov Cod liver oil, 1 tablespoon 1,360  Swordfish, cooked, 3 oz 566  Salmon (sockeye), cooked, 3 oz 447  Tuna fish, canned in water, drained, 3 oz 154  Orange juice fortified with vitamin D, 1 cup (check product labels, as amount of added vitamin D varies) 137  Milk, nonfat, reduced fat, and whole, vitamin D-fortified, 1 cup 115-124  Yogurt, fortified with 20% of the daily value for vitamin D, 6 oz 80  Margarine, fortified, 1 tablespoon 60  Sardines, canned in oil, drained, 2 sardines 46  Liver, beef, cooked, 3 oz 42  Egg, 1 large (vitamin D is found in yolk) 41  Ready-to-eat cereal, fortified with 10% of the daily  value for vitamin D, 0.75-1 cup  40  Cheese, Swiss, 1 oz 6   Exercise for Strong Bones (from [place]) There are two types of exercises that are important for building and maintaining bone density:  weight-bearing and muscle-strengthening exercises. Weight-bearing Exercises These exercises include activities that make you move against gravity while staying upright. Weight-bearing exercises can be high-impact or low-impact. High-impact weight-bearing exercises help build bones and keep them strong. If you have broken a bone due to osteoporosis or are at risk of breaking a bone, you may need to avoid high-impact exercises. If you're  not sure, you should check with your healthcare provider. Examples of high-impact weight-bearing exercises are: . Dancing . Doing high-impact aerobics . Hiking . Jogging/running . Jumping Rope . Stair climbing . Tennis Low-impact weight-bearing exercises can also help keep bones strong and are a safe alternative if you cannot do high-impact exercises. Examples of low-impact weight-bearing exercises are: . Using elliptical training machines . Doing low-impact aerobics . Using stair-step machines . Fast walking on a treadmill or outside Muscle-Strengthening Exercises These exercises include activities where you move your body, a weight or some other resistance against gravity. They are also known as resistance exercises and include: . Lifting weights . Using elastic exercise bands . Using weight machines . Lifting your own body weight . Functional movements, such as standing and rising up on your toes Yoga and Pilates can also improve strength, balance and flexibility. However, certain positions may not be safe for people with osteoporosis or those at increased risk of broken bones. For example, exercises that have you bend forward may increase the chance of breaking a bone in the spine. A physical therapist should be able to help you learn which exercises are safe and appropriate for you. Non-Impact Exercises Non-impact exercises can help you to improve balance, posture and how well you move in everyday activities. These exercises can also help to increase muscle strength and decrease the risk of falls and broken bones. Some of these exercises include: . Balance exercises that strengthen your legs and test your balance, such as Tai Chi, can decrease your risk of falls. . Posture exercises that improve your posture and reduce rounded or "sloping" shoulders can help you decrease the chance of breaking a bone, especially in the spine. . Functional exercises that improve how well you move can  help you with everyday activities and decrease your chance of falling and breaking a bone. For example, if you have trouble getting up from a chair or climbing stairs, you should do these activities as exercises. A physical therapist can teach you balance, posture and functional exercises. Starting a New Exercise Program If you haven't exercised regularly for a while, check with your healthcare provider before beginning a new exercise program-particularly if you have health problems such as heart disease, diabetes or high blood pressure. If you're at high risk of breaking a bone, you should work with a physical therapist to develop a safe exercise program. Once you have your healthcare provider's approval, start slowly. If you've already broken bones in the spine because of osteoporosis, be very careful to avoid activities that require reaching down, bending forward, rapid twisting motions, heavy lifting and those that increase your chance of a fall. As you get started, your muscles may feel sore for a day or two after you exercise. If soreness lasts longer, you may be working too hard and need to ease up. Exercises should be done in a pain-free  range of motion. How Much Exercise Do You Need? Weight-bearing exercises 30 minutes on most days of the week. Do a 30-minutesession or multiple sessions spread out throughout the day. The benefits to your bones are the same.   Muscle-strengthening exercises Two to three days per week. If you don't have much time for strengthening/resistance training, do small amounts at a time. You can do just one body part each day. For example do arms one day, legs the next and trunk the next. You can also spread these exercises out during your normal day.  Balance, posture and functional exercises Every day or as often as needed. You may want to focus on one area more than the others. If you have fallen or lose your balance, spend time doing balance exercises. If you are getting  rounded shoulders, work more on posture exercises. If you have trouble climbing stairs or getting up from the couch, do more functional exercises. You can also perform these exercises at one time or spread them during your day. Work with a phyiscal therapist to learn the right exercises for you.

## 2016-11-23 NOTE — Progress Notes (Addendum)
Patient ID: Isabel [name], female   DOB: 05-09-1956, 61 y.o.   MRN: [id number]    HPI  Isabel [name] is a 61 y.o.-year-old female, referred by her PCP, Dr. Melford Aase, for management of osteoporosis.  Pt was dx with OP ~2013.  I reviewed pt's DEXA scans: Date L1-L4 T score FN T score  [facility])  -3.0  RFN: -1.8 LFN: -2.1   10/07/2013 [facility])  -3.1 (-5.6%*)  RFN: -1.9 LFN: -2.2   07/10/2011 [facility])  -2.8  RFN: n/a LFN: n/a   She denies fracture: - R arm - in 9 (61 y/o)   She fell from the kitchen counter in 2000. No fractures.  No dizziness. H/o + 1 episode of vertigo. No orthostasis/poor vision.  Previous OP treatments: - Alendronate: for many years >> stopped few mo ago >> b/c possible ONJ. Reviewed images and reports of her 3-D reconstructed images of her mouth from 09/06/2016: "The region of interest demonstrates radiographic signs that may be suggestive of osteonecrosis/osteomyelitis. If the patient has a history of taking bisphosphonate drugs, the region of interest would correlate with osteonecrosis. Otherwise, the region of interest may be osteomyelitis. Clinical correlation is recommended".  + h/o vitamin D deficiency. Reviewed available vit D levels - normal now: Lab Results  Component Value Date   VD25OH 67 11/08/2016   VD25OH 56 05/03/2016   VD25OH 65 05/04/2015   VD25OH 60 12/17/2014   VD25OH 49 09/02/2014   VD25OH 63 04/23/2014   VD25OH 82 09/08/2013   Pt is on calcium 500 mg and vitamin D 6000 IU. She also eats dairy and green, leafy, vegetables.   No weight bearing exercises. She was practicing yoga x 9 years >> then stopped b/c pain with movement.  She walks 2 mi a day >> walks her dogs.   She does not take high vitamin A doses.   It is unclear when she started menopause as she was taking hormonal tx for severe HAs (from severe cervical OA). She was also on Methadone, tried Botox. She lately had a nerve burn -  [facility].  Pt does not have a FH of osteoporosis. Mother with Dowager hump.  No h/o hyper/hypocalcemia or hyperparathyroidism. No h/o kidney stones. Lab Results  Component Value Date   CALCIUM 10.1 11/08/2016   CALCIUM 10.0 05/03/2016   CALCIUM 9.8 11/03/2015   CALCIUM 10.2 05/04/2015   CALCIUM 10.0 12/17/2014   CALCIUM 10.1 09/02/2014   CALCIUM 9.6 04/23/2014   CALCIUM 10.0 09/08/2013   No h/o thyrotoxicosis. Reviewed TSH recent levels:  Lab Results  Component Value Date   TSH 1.62 11/08/2016   TSH 1.44 05/03/2016   TSH 1.66 11/03/2015   TSH 1.322 05/04/2015   TSH 1.069 12/17/2014   No h/o CKD. Last BUN/Cr: Lab Results  Component Value Date   BUN 11 11/08/2016   CREATININE 1.04 (H) 11/08/2016    ROS: Constitutional: + Fatigue, + feeling excessively cold, + poor sleep Eyes: no blurry vision, no xerophthalmia ENT: no sore throat, no nodules palpated in throat, no dysphagia/odynophagia, + hoarseness Cardiovascular: no CP/SOB/palpitations/leg swelling Respiratory: + cough/no SOB Gastrointestinal: no N/V/D/C Musculoskeletal: no muscle/+ joint aches Skin: no rashes, + Easy bruising Neurological: no tremors/numbness/tingling/dizziness, + HA Psychiatric: no depression/anxiety + low libido  Past Medical History:  Diagnosis Date  . History of concussion    1972, 1977, 1983  . Hyperlipidemia   . Hypertension   . Migraines    pain clinic [facility]   Past Surgical History:  Procedure Laterality Date  . abdominal wall tear     1988-hernia repair  . fractured right wrist    . LEEP     early 20's  . MOUTH SURGERY    . WRIST SURGERY Right 1973   Social History   Social History  . Marital status: Married    Spouse name: N/A  . Number of children: 0   Occupational History  . Home maker   Social History Main Topics  . Smoking status: Former Smoker    Quit date: 06/07/1994  . Smokeless tobacco: Never Used  . Alcohol use Yes     Comment: rare  . Drug use: No    Current Outpatient Prescriptions on File Prior to Visit  Medication Sig Dispense Refill  . amLODipine (NORVASC) 5 MG tablet take 1 tablet by mouth once daily 90 tablet 1  . azelastine (ASTELIN) 0.1 % nasal spray Place 2 sprays into both nostrils 2 (two) times daily as needed for rhinitis (Use in each nostril as directed). 30 mL 2  . cholecalciferol (VITAMIN D) 1000 UNITS tablet Take 2,000 Units by mouth daily.     . Cyanocobalamin (VITAMIN B 12 PO) Take by mouth daily.    . diazepam (VALIUM) 10 MG tablet Take 10 mg by mouth at bedtime as needed for anxiety.    . Flaxseed, Linseed, (FLAX SEEDS PO) Take by mouth daily.    Marland Kitchen levothyroxine (SYNTHROID) 25 MCG tablet Take 1 tablet (25 mcg total) by mouth daily. 90 tablet 1  . magnesium gluconate (MAGONATE) 500 MG tablet Take 500 mg by mouth every other day.     Marland Kitchen OVER THE COUNTER MEDICATION Takes folic acid 1 tab daily.    Marland Kitchen OVER THE COUNTER MEDICATION Vitamin B 6 1 daily    . Riboflavin 100 MG CAPS Take 200 mg by mouth daily.    . rosuvastatin (CRESTOR) 10 MG tablet take 1 tablet by mouth once daily 90 tablet 1  . ZOMIG 2.5 MG SOLN   4   No current facility-administered medications on file prior to visit.    Allergies  Allergen Reactions  . Aleve [Naproxen Sodium] Anaphylaxis and Other (See Comments)    GI Upset GI Upset GI Upset   . Lipitor [Atorvastatin] Anaphylaxis and Other (See Comments)  . Niacin And Related Anaphylaxis  . Omnaris [Ciclesonide] Anaphylaxis    migraine migraine  . Ceftin [Cefuroxime Axetil] Rash    Other Reaction: Rash and gi upset Other Reaction: Rash and gi upset  . Other Other (See Comments)  . Levaquin [Levofloxacin In D5w]     Muscle cramps  . Simvastatin   . Zetia [Ezetimibe]    Family History  Problem Relation Age of Onset  . Colon cancer Neg Hx   . Depression Mother   . Migraines Mother   . Alzheimer's disease Father   . Hyperlipidemia Father   . Hypertension Father    PE: BP 112/62 (BP  Location: Left Arm, Patient Position: Sitting, Cuff Size: Normal)   Pulse 83   Ht 5\' 1"  (1.549 m)   Wt 102 lb 9.6 oz (46.5 kg)   SpO2 97%   BMI 19.39 kg/m  Wt Readings from Last 3 Encounters:  11/23/16 102 lb 9.6 oz (46.5 kg)  11/08/16 103 lb (46.7 kg)  06/20/16 100 lb 3.2 oz (45.5 kg)   Constitutional: thin, in NAD. No kyphosis. Eyes: PERRLA, EOMI, no exophthalmos ENT: moist mucous membranes, no thyromegaly, no cervical lymphadenopathy Cardiovascular: RRR, No  MRG Respiratory: CTA B Gastrointestinal: abdomen soft, NT, ND, BS+ Musculoskeletal: no deformities, strength intact in all 4. No pain at spine percussion. Skin: moist, warm, no rashes Neurological: no tremor with outstretched hands, DTR normal in all 4  Assessment: 1. Osteoporosis  Plan: 1. Osteoporosis - likely postmenopausal + she has FH of OP - Discussed about increased risk of fracture, depending on the T score, greatly increased when the T score is lower than -2.5, but it is actually a continuum and -2.5 should not be regarded as an absolute threshold. We reviewed her last 3 DEXA scan reports and images together, and I explained that based on the T scores, she has an increased risk for fractures especially of the level of the spine. The T-scores remained stable between the last 2 DEXA scan from 2015 and 2017, however, I explained that these are not directly comparable since they were done in different centers. - we reviewed her dietary and supplemental calcium and vitamin D intake. I recommended to make sure she gets 1000-1200 mg of calcium daily. I believe that she is getting this now. Her recent vit D was normal on the current  supplementation dose - also given her specific instructions about food sources for Calcium and Vitamin D - see pt instructions  - discussed fall precautions   - given handout from [place] [facility]: weight bearing exercises - advised to do this every day or at least 5/7 days - I  also suggested that she tries to go to [facility] for skeletal loading - we discussed about maintaining a good amount of protein in her diet. The recommended daily protein intake is ~0.8 g per kilogram per day (~40-50g per day for her) . I advised her to try to aim for this amount, since a diet low in proteins can exacerbate osteoporosis. Also, avoid smoking or >2 drinks of alcohol a day. - We discussed about the different medication classes, benefits and side effects (including atypical fractures and ONJ. - Because she has a history of potential ONJ, we have to be careful with her treatment. Bisphosphonates and Prolia both can cause ON J, the first more than the latter. Teriparatide/Abaloparatide are options, however, for now, since the bone densities in the last 2 DEXA scan were stable, we discussed about conservative measures (see above), followed by a repeat DEXA scan in one year, and then start 1 of the 2 medicines if worsening. Another option is to start Prolia after her next DEXA scan, as by that time her jaw would be healed.   patient agrees with the plan. However, I would like to obtain an n-TELO peptide today to check her bone destruction rate   - will see pt back in a year  Component     Latest Ref Rng & Units 11/23/2016  N-Telopeptide     6.2 - 19.0 nM BCE 15.4   NTx not suppressed (ideally <10) but also, not very high (>18). I would suggest to continue with the plan above for now, but would have a lower threshold to try to add Prolia in the near future.  Philemon Kingdom, MD PhD [facility]  CC:  Dr. Amil Amen Dr. Lazaro Arms

## 2016-11-24 DIAGNOSIS — M810 Age-related osteoporosis without current pathological fracture: Secondary | ICD-10-CM | POA: Insufficient documentation

## 2016-11-29 LAB — N-TELOPEPTIDE, CROSS-LINKED, SERUM: N-TELOPEPTIDE: 15.4 nmol{BCE} (ref 6.2–19.0)

## 2016-12-18 ENCOUNTER — Other Ambulatory Visit: Payer: Self-pay | Admitting: Physician Assistant

## 2016-12-18 ENCOUNTER — Other Ambulatory Visit: Payer: Self-pay | Admitting: Internal Medicine

## 2017-01-04 DIAGNOSIS — Z681 Body mass index (BMI) 19 or less, adult: Secondary | ICD-10-CM | POA: Diagnosis not present

## 2017-01-04 DIAGNOSIS — Z1231 Encounter for screening mammogram for malignant neoplasm of breast: Secondary | ICD-10-CM | POA: Diagnosis not present

## 2017-01-04 DIAGNOSIS — Z01419 Encounter for gynecological examination (general) (routine) without abnormal findings: Secondary | ICD-10-CM | POA: Diagnosis not present

## 2017-01-18 DIAGNOSIS — M4696 Unspecified inflammatory spondylopathy, lumbar region: Secondary | ICD-10-CM | POA: Diagnosis not present

## 2017-01-18 DIAGNOSIS — G43009 Migraine without aura, not intractable, without status migrainosus: Secondary | ICD-10-CM | POA: Diagnosis not present

## 2017-01-18 DIAGNOSIS — R29898 Other symptoms and signs involving the musculoskeletal system: Secondary | ICD-10-CM | POA: Diagnosis not present

## 2017-01-18 DIAGNOSIS — M47812 Spondylosis without myelopathy or radiculopathy, cervical region: Secondary | ICD-10-CM | POA: Diagnosis not present

## 2017-02-01 DIAGNOSIS — M47812 Spondylosis without myelopathy or radiculopathy, cervical region: Secondary | ICD-10-CM | POA: Diagnosis not present

## 2017-02-01 DIAGNOSIS — M4602 Spinal enthesopathy, cervical region: Secondary | ICD-10-CM | POA: Diagnosis not present

## 2017-02-01 DIAGNOSIS — Z01818 Encounter for other preprocedural examination: Secondary | ICD-10-CM | POA: Diagnosis not present

## 2017-02-15 ENCOUNTER — Ambulatory Visit: Payer: Self-pay | Admitting: Physician Assistant

## 2017-02-15 ENCOUNTER — Other Ambulatory Visit: Payer: Self-pay | Admitting: Internal Medicine

## 2017-02-15 DIAGNOSIS — G5603 Carpal tunnel syndrome, bilateral upper limbs: Secondary | ICD-10-CM | POA: Diagnosis not present

## 2017-02-23 NOTE — Progress Notes (Signed)
Assessment and Plan:   Hypertension -Continue medication, monitor blood pressure at home. Continue DASH diet.  Reminder to go to the ER if any CP, SOB, nausea, dizziness, severe HA, changes vision/speech, left arm numbness and tingling and jaw pain.   Cholesterol -Continue diet and exercise. Check cholesterol.    Prediabetes  -Continue diet and exercise. Check A1C   Vitamin D Def - check level and continue medications.   Gastroesophageal reflux disease with esophagitis Check labs, get on nexium/carafate, refer to GI for possible EGD - stop ibuprofen -     Ambulatory referral to Gastroenterology -     sucralfate (CARAFATE) 1 g tablet; Take 1 tablet (1 g total) by mouth 3 (three) times daily with meals as needed. -     esomeprazole (NEXIUM) 40 MG capsule; Take 1 capsule (40 mg total) by mouth daily.    Continue diet and meds as discussed. Further disposition pending results of labs. Over 30 minutes of exam, counseling, chart review, and critical decision making was performed  Future Appointments Date Time Provider [place]  05/22/2017 3:00 PM Vicie Mutters, PA-C [id number] None  11/23/2017 2:00 PM Philemon Kingdom, MD [id number] None     HPI 61 y.o. female  presents for 3 month follow up on hypertension, cholesterol, prediabetes, and vitamin D deficiency.   Her blood pressure has been controlled at home, today their BP is BP: 124/80  She does not workout due to chronic neck pain, followed by [facility]. She denies chest pain, shortness of breath, dizziness.  She is on cholesterol medication, crestor 10mg  and denies myalgias. Her cholesterol is at goal. The cholesterol last visit was:   Lab Results  Component Value Date   CHOL 167 11/08/2016   HDL 81 11/08/2016   LDLCALC 69 11/08/2016   TRIG 84 11/08/2016   CHOLHDL 2.1 11/08/2016    She has been working on diet and exercise for prediabetes, and denies paresthesia of the feet, polydipsia, polyuria and visual  disturbances. Last A1C in the office was:  Lab Results  Component Value Date   HGBA1C 5.2 11/08/2016  Patient is on Vitamin D supplement.   Lab Results  Component Value Date   VD25OH 67 11/08/2016   She is on thyroid medication. Her medication was not changed last visit.   Lab Results  Component Value Date   TSH 1.62 11/08/2016  .  She has had GERD for several years, states OTC, zantac, omperazole and protonix without help. States can drink water and have reflux. No dark black stool/blood in stool. She takes ibuprofen regularly for migraines/OA, takes daily, follows neurology.  No ETOH.   Current Medications:  Current Outpatient Prescriptions on File Prior to Visit  Medication Sig Dispense Refill  . amLODipine (NORVASC) 5 MG tablet take 1 tablet by mouth once daily 90 tablet 1  . azelastine (ASTELIN) 0.1 % nasal spray Place 2 sprays into both nostrils 2 (two) times daily as needed for rhinitis (Use in each nostril as directed). 30 mL 2  . cholecalciferol (VITAMIN D) 1000 UNITS tablet Take 2,000 Units by mouth daily.     . Cyanocobalamin (VITAMIN B 12 PO) Take by mouth daily.    . diazepam (VALIUM) 10 MG tablet Take 10 mg by mouth at bedtime as needed for anxiety.    . Flaxseed, Linseed, (FLAX SEEDS PO) Take by mouth daily.    . magnesium gluconate (MAGONATE) 500 MG tablet Take 500 mg by mouth every other day.     Marland Kitchen  OVER THE COUNTER MEDICATION Takes folic acid 1 tab daily.    Marland Kitchen OVER THE COUNTER MEDICATION Vitamin B 6 1 daily    . Riboflavin 100 MG CAPS Take 200 mg by mouth daily.    . rosuvastatin (CRESTOR) 10 MG tablet take 1 tablet by mouth once daily 90 tablet 1  . SYNTHROID 25 MCG tablet take 1 tablet by mouth once daily 90 tablet 1  . ZOMIG 2.5 MG SOLN   4   No current facility-administered medications on file prior to visit.    Medical History:  Past Medical History:  Diagnosis Date  . History of concussion    1972, 1977, 1983  . Hyperlipidemia   . Hypertension   .  Migraines    pain clinic [place]   Allergies:  Allergies  Allergen Reactions  . Aleve [Naproxen Sodium] Anaphylaxis and Other (See Comments)    GI Upset GI Upset GI Upset   . Lipitor [Atorvastatin] Anaphylaxis and Other (See Comments)  . Niacin And Related Anaphylaxis  . Omnaris [Ciclesonide] Anaphylaxis    migraine migraine  . Ceftin [Cefuroxime Axetil] Rash    Other Reaction: Rash and gi upset Other Reaction: Rash and gi upset  . Other Other (See Comments)  . Levaquin [Levofloxacin In D5w]     Muscle cramps  . Simvastatin   . Zetia [Ezetimibe]      Review of Systems:  Review of Systems  Constitutional: Negative.   HENT: Negative for congestion, ear discharge, ear pain, hearing loss, nosebleeds, sore throat and tinnitus.   Eyes: Negative.   Respiratory: Negative.  Negative for cough and stridor.   Cardiovascular: Negative.  Negative for chest pain.  Gastrointestinal: Negative.   Genitourinary: Negative.   Musculoskeletal: Positive for back pain, joint pain, myalgias and neck pain.  Skin: Negative.   Neurological: Positive for headaches (better). Negative for dizziness, tingling, tremors, sensory change, speech change, focal weakness, seizures and loss of consciousness.  Endo/Heme/Allergies: Negative.   Psychiatric/Behavioral: Negative for depression, hallucinations, memory loss, substance abuse and suicidal ideas. The patient is not nervous/anxious and does not have insomnia.     Family history- Review and unchanged Social history- Review and unchanged Physical Exam: BP 124/80   Pulse 66   Temp (!) 97.5 F (36.4 C)   Resp 14   Ht 5\' 1"  (1.549 m)   Wt 96 lb 6.4 oz (43.7 kg)   SpO2 95%   BMI 18.21 kg/m  Wt Readings from Last 3 Encounters:  02/26/17 96 lb 6.4 oz (43.7 kg)  11/23/16 102 lb 9.6 oz (46.5 kg)  11/08/16 103 lb (46.7 kg)   General Appearance: Well nourished, in no apparent distress. Eyes: PERRLA, EOMs, conjunctiva no swelling or erythema Sinuses: No  Frontal/maxillary tenderness ENT/Mouth: Ext aud canals clear, TMs without erythema, bulging. No erythema, swelling, or exudate on post pharynx.  Tonsils not swollen or erythematous. Hearing normal.  Neck: Supple, thyroid normal.  Respiratory: Respiratory effort normal, BS equal bilaterally without rales, rhonchi, wheezing or stridor.  Cardio: RRR with no MRGs. Brisk peripheral pulses without edema.  Abdomen: Soft, + BS,  Non tender, no guarding, rebound, hernias, masses. Lymphatics: Non tender without lymphadenopathy.  Musculoskeletal: Full ROM, 5/5 strength, Normal gait Skin: Warm, dry without rashes, lesions, ecchymosis.  Neuro: Cranial nerves intact. Normal muscle tone, no cerebellar symptoms. Psych: Awake and oriented X 3, normal affect, Insight and Judgment appropriate.    Vicie Mutters, PA-C 3:54 PM [facility]

## 2017-02-26 ENCOUNTER — Encounter: Payer: Self-pay | Admitting: Physician Assistant

## 2017-02-26 ENCOUNTER — Ambulatory Visit (INDEPENDENT_AMBULATORY_CARE_PROVIDER_SITE_OTHER): Payer: BLUE CROSS/BLUE SHIELD | Admitting: Physician Assistant

## 2017-02-26 VITALS — BP 124/80 | HR 66 | Temp 97.5°F | Resp 14 | Ht 61.0 in | Wt 96.4 lb

## 2017-02-26 DIAGNOSIS — E782 Mixed hyperlipidemia: Secondary | ICD-10-CM | POA: Diagnosis not present

## 2017-02-26 DIAGNOSIS — R030 Elevated blood-pressure reading, without diagnosis of hypertension: Secondary | ICD-10-CM | POA: Diagnosis not present

## 2017-02-26 DIAGNOSIS — K2100 Gastro-esophageal reflux disease with esophagitis, without bleeding: Secondary | ICD-10-CM

## 2017-02-26 DIAGNOSIS — Z79899 Other long term (current) drug therapy: Secondary | ICD-10-CM

## 2017-02-26 LAB — CBC WITH DIFFERENTIAL/PLATELET
BASOS ABS: 0 {cells}/uL (ref 0–200)
Basophils Relative: 0 %
Eosinophils Absolute: 164 cells/uL (ref 15–500)
Eosinophils Relative: 2 %
HEMATOCRIT: 40.5 % (ref 35.0–45.0)
HEMOGLOBIN: 13.6 g/dL (ref 11.7–15.5)
LYMPHS ABS: 2132 {cells}/uL (ref 850–3900)
LYMPHS PCT: 26 %
MCH: 29.7 pg (ref 27.0–33.0)
MCHC: 33.6 g/dL (ref 32.0–36.0)
MCV: 88.4 fL (ref 80.0–100.0)
MONO ABS: 738 {cells}/uL (ref 200–950)
MPV: 9.9 fL (ref 7.5–12.5)
Monocytes Relative: 9 %
NEUTROS PCT: 63 %
Neutro Abs: 5166 cells/uL (ref 1500–7800)
Platelets: 278 10*3/uL (ref 140–400)
RBC: 4.58 MIL/uL (ref 3.80–5.10)
RDW: 14.2 % (ref 11.0–15.0)
WBC: 8.2 10*3/uL (ref 3.8–10.8)

## 2017-02-26 LAB — LIPID PANEL
CHOLESTEROL: 177 mg/dL (ref <200)
HDL: 74 mg/dL (ref >50)
LDL CALC: 87 mg/dL (ref <100)
Total CHOL/HDL Ratio: 2.4 Ratio (ref <5.0)
Triglycerides: 79 mg/dL (ref <150)
VLDL: 16 mg/dL (ref <30)

## 2017-02-26 LAB — TSH: TSH: 1.65 mIU/L

## 2017-02-26 LAB — BASIC METABOLIC PANEL WITH GFR
BUN: 11 mg/dL (ref 7–25)
CALCIUM: 10 mg/dL (ref 8.6–10.4)
CO2: 27 mmol/L (ref 20–31)
Chloride: 102 mmol/L (ref 98–110)
Creat: 0.91 mg/dL (ref 0.50–0.99)
GFR, EST AFRICAN AMERICAN: 78 mL/min (ref >=60)
GFR, EST NON AFRICAN AMERICAN: 68 mL/min (ref >=60)
Glucose, Bld: 87 mg/dL (ref 65–99)
Potassium: 3.9 mmol/L (ref 3.5–5.3)
Sodium: 140 mmol/L (ref 135–146)

## 2017-02-26 LAB — HEPATIC FUNCTION PANEL
ALBUMIN: 4.7 g/dL (ref 3.6–5.1)
ALT: 12 U/L (ref 6–29)
AST: 27 U/L (ref 10–35)
Alkaline Phosphatase: 26 U/L — ABNORMAL LOW (ref 33–130)
BILIRUBIN INDIRECT: 0.5 mg/dL (ref 0.2–1.2)
Bilirubin, Direct: 0.1 mg/dL (ref <=0.2)
TOTAL PROTEIN: 6.6 g/dL (ref 6.1–8.1)
Total Bilirubin: 0.6 mg/dL (ref 0.2–1.2)

## 2017-02-26 MED ORDER — SUCRALFATE 1 G PO TABS: 1.0000 g | ORAL_TABLET | Freq: Three times a day (TID) | ORAL | 0 refills | Status: DC | PRN

## 2017-02-26 MED ORDER — ESOMEPRAZOLE MAGNESIUM 40 MG PO CPDR: 40.0000 mg | DELAYED_RELEASE_CAPSULE | Freq: Every day | ORAL | 0 refills | Status: DC

## 2017-02-26 NOTE — Patient Instructions (Signed)
Try the carafate as needed with food Will send in nexium for you to try Try to avoid ibuprofen Will refer to GI Check diet below to avoid GERD   Food Choices for Gastroesophageal Reflux Disease, Adult When you have gastroesophageal reflux disease (GERD), the foods you eat and your eating habits are very important. Choosing the right foods can help ease your discomfort. What guidelines do I need to follow?  Choose fruits, vegetables, whole grains, and low-fat dairy products.  Choose low-fat meat, fish, and poultry.  Limit fats such as oils, salad dressings, butter, nuts, and avocado.  Keep a food diary. This helps you identify foods that cause symptoms.  Avoid foods that cause symptoms. These may be different for everyone.  Eat small meals often instead of 3 large meals a day.  Eat your meals slowly, in a place where you are relaxed.  Limit fried foods.  Cook foods using methods other than frying.  Avoid drinking alcohol.  Avoid drinking large amounts of liquids with your meals.  Avoid bending over or lying down until 2-3 hours after eating. What foods are not recommended? These are some foods and drinks that may make your symptoms worse: Vegetables Tomatoes. Tomato juice. Tomato and spaghetti sauce. Chili peppers. Onion and garlic. Horseradish. Fruits Oranges, grapefruit, and lemon (fruit and juice). Meats High-fat meats, fish, and poultry. This includes hot dogs, ribs, ham, sausage, salami, and bacon. Dairy Whole milk and chocolate milk. Sour cream. Cream. Butter. Ice cream. Cream cheese. Drinks Coffee and tea. Bubbly (carbonated) drinks or energy drinks. Condiments Hot sauce. Barbecue sauce. Sweets/Desserts Chocolate and cocoa. Donuts. Peppermint and spearmint. Fats and Oils High-fat foods. This includes [place] fries and potato chips. Other Vinegar. Strong spices. This includes black pepper, white pepper, red pepper, cayenne, curry powder, cloves, ginger, and  chili powder. The items listed above may not be a complete list of foods and drinks to avoid. Contact your dietitian for more information. This information is not intended to replace advice given to you by your health care provider. Make sure you discuss any questions you have with your health care provider. Document Released: 01/16/2012 Document Revised: 12/23/2015 Document Reviewed: 05/21/2013 Elsevier Interactive Patient Education  2017 [employer].

## 2017-02-27 LAB — MAGNESIUM: Magnesium: 2 mg/dL (ref 1.5–2.5)

## 2017-03-01 ENCOUNTER — Encounter: Payer: Self-pay | Admitting: Gastroenterology

## 2017-03-01 ENCOUNTER — Ambulatory Visit (INDEPENDENT_AMBULATORY_CARE_PROVIDER_SITE_OTHER): Payer: BLUE CROSS/BLUE SHIELD | Admitting: Gastroenterology

## 2017-03-01 VITALS — BP 110/70 | HR 76 | Ht 61.0 in | Wt 97.6 lb

## 2017-03-01 DIAGNOSIS — R0989 Other specified symptoms and signs involving the circulatory and respiratory systems: Secondary | ICD-10-CM

## 2017-03-01 DIAGNOSIS — R6889 Other general symptoms and signs: Secondary | ICD-10-CM | POA: Diagnosis not present

## 2017-03-01 DIAGNOSIS — Z1212 Encounter for screening for malignant neoplasm of rectum: Secondary | ICD-10-CM

## 2017-03-01 DIAGNOSIS — Z1211 Encounter for screening for malignant neoplasm of colon: Secondary | ICD-10-CM

## 2017-03-01 MED ORDER — RANITIDINE HCL 300 MG PO TABS: 300.0000 mg | ORAL_TABLET | Freq: Two times a day (BID) | ORAL | 2 refills | Status: DC

## 2017-03-01 NOTE — Progress Notes (Signed)
History of Present Illness: This is a 61 year old female referred by Vicie Mutters, PA-C for the evaluation of chronic throat clearing. Symptoms have been present for 3 years. She has been evaluated by Dr. Ernesto Rutherford, ENT and another ENT physician in [facility] for sinus problems. She relates throat clearing symptoms have worsened over the past few months. She notes significant postnasal drip and sinus drainage. She denies heartburn, chest pain, regurgitation, abdominal pain, postprandial symptoms, cough, dysphagia, odynophagia, weight loss, nausea, vomiting, melena, hematochezia, change in bowel habits, constipation, diarrhea. She was recommended to take Nexium and pantoprazole. She took pantoprazole for about 3 weeks with no change in symptoms. She is very concerned about using a PPI as she has osteoporosis and does not want to risk worsening her osteoporosis.  Allergies  Allergen Reactions  . Aleve [Naproxen Sodium] Anaphylaxis and Other (See Comments)    GI Upset GI Upset GI Upset   . Lipitor [Atorvastatin] Anaphylaxis and Other (See Comments)  . Niacin And Related Anaphylaxis  . Omnaris [Ciclesonide] Anaphylaxis    migraine migraine  . Ceftin [Cefuroxime Axetil] Rash    Other Reaction: Rash and gi upset Other Reaction: Rash and gi upset  . Other Other (See Comments)  . Levaquin [Levofloxacin In D5w]     Muscle cramps  . Simvastatin   . Zetia [Ezetimibe]    Outpatient Medications Prior to Visit  Medication Sig Dispense Refill  . amLODipine (NORVASC) 5 MG tablet take 1 tablet by mouth once daily 90 tablet 1  . diazepam (VALIUM) 10 MG tablet Take 5 mg by mouth at bedtime as needed for anxiety.     . Flaxseed, Linseed, (FLAX SEEDS PO) Take by mouth daily.    . magnesium gluconate (MAGONATE) 500 MG tablet Take 500 mg by mouth daily.     Marland Kitchen OVER THE COUNTER MEDICATION Takes folic acid 1 tab daily.    Marland Kitchen OVER THE COUNTER MEDICATION Vitamin B 6 1 daily    . Riboflavin 100 MG CAPS Take  200 mg by mouth daily.    . rosuvastatin (CRESTOR) 10 MG tablet take 1 tablet by mouth once daily 90 tablet 1  . cholecalciferol (VITAMIN D) 1000 UNITS tablet Take 1,000 Units by mouth daily.     . Cyanocobalamin (VITAMIN B 12 PO) Take by mouth daily.    Marland Kitchen azelastine (ASTELIN) 0.1 % nasal spray Place 2 sprays into both nostrils 2 (two) times daily as needed for rhinitis (Use in each nostril as directed). 30 mL 2  . esomeprazole (NEXIUM) 40 MG capsule Take 1 capsule (40 mg total) by mouth daily. 30 capsule 0  . sucralfate (CARAFATE) 1 g tablet Take 1 tablet (1 g total) by mouth 3 (three) times daily with meals as needed. 60 tablet 0  . SYNTHROID 25 MCG tablet take 1 tablet by mouth once daily (Patient taking differently: No sig reported) 90 tablet 1  . ZOMIG 2.5 MG SOLN   4   No facility-administered medications prior to visit.    Past Medical History:  Diagnosis Date  . Carpal tunnel syndrome on right   . History of concussion    1972, 1977, 1983  . Hyperlipidemia   . Hypertension   . Migraines    pain clinic [place]  . Osteoporosis    Past Surgical History:  Procedure Laterality Date  . 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 fractured right wrist    .  LEEP     early 20's  . MOUTH SURGERY    . WRIST SURGERY Right 1973   Social History   Social History  . Marital status: Married    Spouse name: N/A  . Number of children: 0  . Years of education: N/A   Occupational History  . school volunteer    Social History Main Topics  . Smoking status: Former Smoker    Quit date: 06/07/1994  . Smokeless tobacco: Never Used  . Alcohol use No  . Drug use: No  . Sexual activity: Not Asked     Comment: put in at age 89, never removed   Other Topics Concern  . None   Social History Narrative  . None   Family History  Problem Relation Age of Onset  . Depression Mother   . Migraines Mother   . Stroke Mother   . Alzheimer's disease Father   .  Hyperlipidemia Father   . Hypertension Father   . Colon cancer Neg Hx       Review of Systems: Pertinent positive and negative review of systems were noted in the above HPI section. All other review of systems were otherwise negative.   Physical Exam: General: Well developed, well nourished, thin, no acute distress Head: Normocephalic and atraumatic Eyes:  sclerae anicteric, EOMI Ears: Normal auditory acuity Mouth: No deformity or lesions Neck: Supple, no masses or thyromegaly Lungs: Clear throughout to auscultation Heart: Regular rate and rhythm; no murmurs, rubs or bruits Abdomen: Soft, non tender and non distended. No masses, hepatosplenomegaly or hernias noted. Normal Bowel sounds Rectal: not done Musculoskeletal: Symmetrical with no gross deformities  Skin: No lesions on visible extremities Pulses:  Normal pulses noted Extremities: No clubbing, cyanosis, edema or deformities noted Neurological: Alert oriented x 4, grossly nonfocal Cervical Nodes:  No significant cervical adenopathy Inguinal Nodes: No significant inguinal adenopathy Psychological:  Alert and cooperative. Normal mood and affect  Assessment and Recommendations:  1. Chronic throat clearing. Suspect an ENT disorder. She has no other symptoms that suggest GERD however LPR is still a possibility. I advised her to return to one of her ENT physicians for further evaluation of her complaints. I feel this is unlikely to be GERD with LPR however will begin antireflux measures and ranitidine 300 mg twice daily for 2-3 month trial and assess for symptom improvement. Consider EGD for further evaluation if GERD is still suspected after ENT evaluation. Return visit in 2 months.  2. CRC screening, average risk. A 10 year interval colonoscopy is recommended in December 2023.  cc: Vicie Mutters, PA-C [address] [place], [place] [address]

## 2017-03-01 NOTE — Patient Instructions (Signed)
We have sent the following medications to your pharmacy for you to pick up at your convenience: Zantac 300 mg twice daily.   Patient advised to avoid spicy, acidic, citrus, chocolate, mints, fruit and fruit juices.  Limit the intake of caffeine, alcohol and Soda.  Don't exercise too soon after eating.  Don't lie down within 3-4 hours of eating.  Elevate the head of your bed.  Call and make an appointment with your ENT physician for chronic throat clearing.   Normal BMI (Body Mass Index- based on height and weight) is between 19 and 25. Your BMI today is Body mass index is 18.44 kg/m. Marland Kitchen Please consider follow up  regarding your BMI with your Primary Care Provider.  Thank you for choosing me and [facility].  Pricilla Riffle. Dagoberto Ligas., MD., [facility] Regal

## 2017-04-12 DIAGNOSIS — M47812 Spondylosis without myelopathy or radiculopathy, cervical region: Secondary | ICD-10-CM | POA: Diagnosis not present

## 2017-04-17 DIAGNOSIS — K219 Gastro-esophageal reflux disease without esophagitis: Secondary | ICD-10-CM | POA: Diagnosis not present

## 2017-04-17 DIAGNOSIS — J343 Hypertrophy of nasal turbinates: Secondary | ICD-10-CM | POA: Diagnosis not present

## 2017-04-17 DIAGNOSIS — J310 Chronic rhinitis: Secondary | ICD-10-CM | POA: Diagnosis not present

## 2017-04-17 DIAGNOSIS — J342 Deviated nasal septum: Secondary | ICD-10-CM | POA: Diagnosis not present

## 2017-05-07 ENCOUNTER — Encounter: Payer: Self-pay | Admitting: Internal Medicine

## 2017-05-07 ENCOUNTER — Ambulatory Visit: Payer: BLUE CROSS/BLUE SHIELD | Admitting: Gastroenterology

## 2017-05-07 NOTE — Progress Notes (Signed)
sent 

## 2017-05-21 DIAGNOSIS — G4700 Insomnia, unspecified: Secondary | ICD-10-CM | POA: Diagnosis not present

## 2017-05-21 DIAGNOSIS — E039 Hypothyroidism, unspecified: Secondary | ICD-10-CM | POA: Insufficient documentation

## 2017-05-21 DIAGNOSIS — G43009 Migraine without aura, not intractable, without status migrainosus: Secondary | ICD-10-CM | POA: Diagnosis not present

## 2017-05-21 NOTE — Progress Notes (Signed)
Complete Physical  Assessment and Plan:  Elevated BP without diagnosis of hypertension - continue medications, DASH diet, exercise and monitor at home. Call if greater than 130/80.  -     CBC with Differential/Platelet -     BASIC METABOLIC PANEL WITH GFR -     Hepatic function panel -     TSH -     Urinalysis, Routine w reflex microscopic -     Microalbumin / creatinine urine ratio -     EKG 12-Lead  Mixed hyperlipidemia -continue medications, check lipids, decrease fatty foods, increase activity.  -     Lipid panel  Prediabetes Discussed general issues about diabetes pathophysiology and management., Educational material distributed., Suggested low cholesterol diet., Encouraged aerobic exercise., Discussed foot care., Reminded to get yearly retinal exam.  Medication management -     Magnesium  Vitamin D deficiency Continue supplement  Other migraine without status migrainosus, not intractable Continue meds  Acute recurrent sinusitis, unspecified location Monitor  Gastroesophageal reflux disease, esophagitis presence not specified Continue PPI/H2 blocker, diet discussed  Cervical spondylosis without myelopathy Monitored at [facility]  Age-related osteoporosis without current pathological fracture Need DEXA 2019  Hypothyroidism, unspecified type -     TSH Hypothyroidism-check TSH level, continue medications the same, reminded to take on an empty stomach 30-37mins before food.   Encounter for general adult medical examination with abnormal findings 1 year  Screening, anemia, deficiency, iron -     Iron,Total/Total Iron Binding Cap  Discussed med's effects and SE's. Screening labs and tests as requested with regular follow-up as recommended. Future Appointments Date Time Provider [place]  06/14/2017 11:15 AM Ladene Artist, MD [facility]  11/23/2017 2:00 PM Philemon Kingdom, MD [facility] None  06/04/2018 3:00 PM Vicie Mutters, PA-C [facility]  None    HPI  61 y.o. female  presents for a complete physical.  Her blood pressure has been controlled at home, today their BP is BP: 116/80.  She does not workout. She denies chest pain, shortness of breath, dizziness.  Went to Dr. Fuller Plan for reflux, went to ENT and sinuses are clear, still having throat clearing.  She is on cholesterol medication and denies myalgias. Her cholesterol is at goal. The cholesterol last visit was:  Lab Results  Component Value Date   CHOL 177 02/26/2017   HDL 74 02/26/2017   LDLCALC 87 02/26/2017   TRIG 79 02/26/2017   CHOLHDL 2.4 02/26/2017  .  Last A1C in the office was:  Lab Results  Component Value Date   HGBA1C 5.2 11/08/2016   Patient is on Vitamin D supplement.   Lab Results  Component Value Date   VD25OH 57 11/08/2016     She is still being treated at [facility] for nerve ablation and injections by Dr. Carloyn Manner there.     She is on thyroid medication. Her medication was not changed last visit.   Lab Results  Component Value Date   TSH 1.65 02/26/2017  .  BMI is Body mass index is 18.93 kg/m., she is working on diet and exercise. Wt Readings from Last 3 Encounters:  05/22/17 100 lb 3.2 oz (45.5 kg)  03/01/17 97 lb 9.6 oz (44.3 kg)  02/26/17 96 lb 6.4 oz (43.7 kg)     Current Medications:  Current Outpatient Prescriptions on File Prior to Visit  Medication Sig Dispense Refill  . amLODipine (NORVASC) 5 MG tablet take 1 tablet by mouth once daily 90 tablet 1  . Cholecalciferol (VITAMIN D) 2000  units CAPS Take 6,000 capsules by mouth daily.    . Coenzyme Q10 (CO Q 10) 100 MG CAPS Take 1 capsule by mouth daily.    . Cyanocobalamin (B12 LIQUID HEALTH BOOSTER PO) Take by mouth. 1/2 -3/4 eye dropper daily    . diazepam (VALIUM) 10 MG tablet Take 5 mg by mouth at bedtime as needed for anxiety.     . Flaxseed, Linseed, (FLAX SEEDS PO) Take by mouth daily.    Marland Kitchen levothyroxine (SYNTHROID, LEVOTHROID) 25 MCG tablet Take 12.5 mcg by mouth. 5 days a week     . magnesium gluconate (MAGONATE) 500 MG tablet Take 500 mg by mouth daily.     . Melatonin 1 MG TABS Take 1 tablet by mouth at bedtime.    . Omega-3 Fatty Acids (FISH OIL) 1200 MG CAPS Take 1 capsule by mouth daily.    Marland Kitchen OVER THE COUNTER MEDICATION Takes folic acid 1 tab daily.    Marland Kitchen OVER THE COUNTER MEDICATION Vitamin B 6 1 daily    . ranitidine (ZANTAC) 300 MG tablet Take 1 tablet (300 mg total) by mouth 2 (two) times daily. 60 tablet 2  . Riboflavin 100 MG CAPS Take 200 mg by mouth daily.    . rizatriptan (MAXALT) 10 MG tablet Take 10 mg by mouth as needed for migraine. May repeat in 2 hours if needed    . rosuvastatin (CRESTOR) 10 MG tablet take 1 tablet by mouth once daily 90 tablet 1  . TURMERIC PO Take 1 capsule by mouth daily.     No current facility-administered medications on file prior to visit.     Health Maintenance:   Immunization History  Administered Date(s) Administered  . PPD Test 09/08/2013  . Pneumococcal-Unspecified 08/01/2007  . Tdap 07/31/2011  . Zoster 11/21/2016   Tetanus: 2012 Pneumovax: 2009 Influenza declines Shingles vaccine declines  Pap: 2017 MGM: 2018 per patient at [facility] office will get records DEXA: 2017 due next year Colonoscopy: 2013  Patient Care Team: Unk Pinto, MD as PCP - General (Internal Medicine) Ladene Artist, MD as Consulting Physician (Gastroenterology) Fay Records, MD as Consulting Physician (Cardiology) Jessy Oto, MD as Consulting Physician (Orthopedic Surgery) Deneise Lever, MD as Consulting Physician (Pulmonary Disease) [facility]   Allergies:  Allergies  Allergen Reactions  . Aleve [Naproxen Sodium] Anaphylaxis and Other (See Comments)    GI Upset GI Upset GI Upset   . Lipitor [Atorvastatin] Anaphylaxis and Other (See Comments)  . Niacin And Related Anaphylaxis  . Omnaris [Ciclesonide] Anaphylaxis    migraine migraine  . Ceftin [Cefuroxime Axetil] Rash    Other  Reaction: Rash and gi upset Other Reaction: Rash and gi upset  . Other Other (See Comments)  . Levaquin [Levofloxacin In D5w]     Muscle cramps  . Simvastatin   . Zetia [Ezetimibe]     Medical History:  Past Medical History:  Diagnosis Date  . Carpal tunnel syndrome on right   . History of concussion    1972, 1977, 1983  . Hyperlipidemia   . Hypertension   . Migraines    pain clinic [place]  . Osteoporosis     SURGICAL HISTORY She  has a past surgical history that includes abdominal wall tear; Mouth surgery; fractured right wrist; Wrist surgery (Right, 1973); and LEEP. FAMILY HISTORY Her family history includes Alzheimer's disease in her father; Depression in her mother; Hyperlipidemia in her father; Hypertension in her father; Migraines in her  mother; Stroke in her mother. SOCIAL HISTORY She  reports that she quit smoking about 22 years ago. She has never used smokeless tobacco. She reports that she does not drink alcohol or use drugs.  Review of Systems: Review of Systems  Constitutional: Negative for chills, fever and malaise/fatigue.  HENT: Negative for congestion, ear pain and sore throat.   Eyes: Negative.   Respiratory: Negative for cough, shortness of breath and wheezing.   Cardiovascular: Negative for chest pain, palpitations and leg swelling.  Gastrointestinal: Negative for abdominal pain, blood in stool, constipation, diarrhea, heartburn and melena.  Genitourinary: Negative.   Skin: Negative.   Neurological: Negative for dizziness, sensory change, loss of consciousness and headaches.  Psychiatric/Behavioral: Negative for depression. The patient is not nervous/anxious and does not have insomnia.     Physical Exam: Estimated body mass index is 18.93 kg/m as calculated from the following:   Height as of this encounter: 5\' 1"  (1.549 m).   Weight as of this encounter: 100 lb 3.2 oz (45.5 kg). BP 116/80   Pulse 77   Temp 97.7 F (36.5 C)   Resp 14   Ht 5\' 1"   (1.549 m)   Wt 100 lb 3.2 oz (45.5 kg)   SpO2 98%   BMI 18.93 kg/m   General Appearance: Very thin and cachectic, well developed, in no apparent distress.  Eyes: PERRLA, EOMs, conjunctiva no swelling or erythema ENT/Mouth: Ear canals normal without obstruction, swelling, erythema, or discharge.  TMs normal bilaterally with no erythema, bulging, retraction, or loss of landmark.  Oropharynx moist and clear with no exudate, erythema, or swelling.   Neck: Supple, thyroid normal. No bruits.  No cervical adenopathy Respiratory: Respiratory effort normal, Breath sounds clear A&P without wheeze, rhonchi, rales.   Cardio: RRR without murmurs, rubs or gallops. Brisk peripheral pulses without edema.  Chest: Deferred to obgyn Breasts: Symmetric, without lumps, nipple discharge, retractions.  Abdomen: Soft, nontender, no guarding, rebound, hernias, masses, or organomegaly.  Lymphatics: Non tender without lymphadenopathy.  Musculoskeletal: Full ROM all peripheral extremities,5/5 strength, and normal gait.  Skin: Warm, dry without rashes, lesions, ecchymosis. Neuro: Awake and oriented X 3, Cranial nerves intact, reflexes equal bilaterally. Normal muscle tone, no cerebellar symptoms. Sensation intact.  Psych:  normal affect, Insight and Judgment appropriate.   EKG: WNL no changes.  Over 40 minutes of exam, counseling, chart review and critical decision making was performed  Vicie Mutters 3:33 PM [facility]

## 2017-05-22 ENCOUNTER — Encounter: Payer: Self-pay | Admitting: Physician Assistant

## 2017-05-22 ENCOUNTER — Ambulatory Visit (INDEPENDENT_AMBULATORY_CARE_PROVIDER_SITE_OTHER): Payer: BLUE CROSS/BLUE SHIELD | Admitting: Physician Assistant

## 2017-05-22 VITALS — BP 116/80 | HR 77 | Temp 97.7°F | Resp 14 | Ht 61.0 in | Wt 100.2 lb

## 2017-05-22 DIAGNOSIS — Z79899 Other long term (current) drug therapy: Secondary | ICD-10-CM

## 2017-05-22 DIAGNOSIS — R7303 Prediabetes: Secondary | ICD-10-CM

## 2017-05-22 DIAGNOSIS — J342 Deviated nasal septum: Secondary | ICD-10-CM | POA: Diagnosis not present

## 2017-05-22 DIAGNOSIS — Z130 Encounter for screening for diseases of the blood and blood-forming organs and certain disorders involving the immune mechanism: Secondary | ICD-10-CM

## 2017-05-22 DIAGNOSIS — J343 Hypertrophy of nasal turbinates: Secondary | ICD-10-CM | POA: Diagnosis not present

## 2017-05-22 DIAGNOSIS — M810 Age-related osteoporosis without current pathological fracture: Secondary | ICD-10-CM

## 2017-05-22 DIAGNOSIS — Z0000 Encounter for general adult medical examination without abnormal findings: Secondary | ICD-10-CM | POA: Diagnosis not present

## 2017-05-22 DIAGNOSIS — M47812 Spondylosis without myelopathy or radiculopathy, cervical region: Secondary | ICD-10-CM

## 2017-05-22 DIAGNOSIS — K219 Gastro-esophageal reflux disease without esophagitis: Secondary | ICD-10-CM | POA: Diagnosis not present

## 2017-05-22 DIAGNOSIS — R030 Elevated blood-pressure reading, without diagnosis of hypertension: Secondary | ICD-10-CM

## 2017-05-22 DIAGNOSIS — E782 Mixed hyperlipidemia: Secondary | ICD-10-CM

## 2017-05-22 DIAGNOSIS — Z136 Encounter for screening for cardiovascular disorders: Secondary | ICD-10-CM | POA: Diagnosis not present

## 2017-05-22 DIAGNOSIS — J0191 Acute recurrent sinusitis, unspecified: Secondary | ICD-10-CM

## 2017-05-22 DIAGNOSIS — E559 Vitamin D deficiency, unspecified: Secondary | ICD-10-CM

## 2017-05-22 DIAGNOSIS — E039 Hypothyroidism, unspecified: Secondary | ICD-10-CM

## 2017-05-22 DIAGNOSIS — Z0001 Encounter for general adult medical examination with abnormal findings: Secondary | ICD-10-CM

## 2017-05-22 DIAGNOSIS — G43809 Other migraine, not intractable, without status migrainosus: Secondary | ICD-10-CM

## 2017-05-22 DIAGNOSIS — J310 Chronic rhinitis: Secondary | ICD-10-CM | POA: Diagnosis not present

## 2017-05-22 NOTE — Patient Instructions (Addendum)
sometimes irritation causes more irritation. Try voice rest, use sugar free candy and try to stop clearing your throat.   ASK DR. ROSS IF THEY DO DEXA OR BONE DENSITY SCANS IN THE OFFICE   Aleve, ibuprofen is an antiinflammatory You can take tylenol (500mg ) or tylenol arthritis (650mg ) with the meloxicam/antiinflammatories. The max you can take of tylenol a day is 3000mg  daily, this is a max of 6 pills a day of the regular tyelnol (500mg ) or a max of 4 a day of the tylenol arthritis (650mg ) as long as no other medications you are taking contain tylenol.   this can cause inflammation in your stomach and can cause ulcers or bleeding, this will look like black tarry stools Make sure you taking it with food Try not to take it daily, take AS needed Can take with zantac    To prevent migraines you can try these natural/OTC medications as prevention: 1) Melatonin 5mg -15mg  30 mins before bed 2) Riboflavin/B2 400mg  daily 3) CoQ10 100mg  3 x a day  We may also treat TMJ if we think you have it If you are having frequent migraines we may put you on a once a day medication with fast acting medication to take. Here is more information below  Please remember, common headache triggers are: sleep deprivation, dehydration, overheating, stress, hypoglycemia or skipping meals and blood sugar fluctuations, excessive pain medications or excessive alcohol use or caffeine withdrawal. Some people have food triggers such as aged cheese, orange juice or chocolate, especially dark chocolate, or MSG (monosodium glutamate). Try to avoid these headache triggers as much possible. It may be helpful to keep a headache diary to figure out what makes your headaches worse or brings them on and what alleviates them. Some people report headache onset after exercise but studies have shown that regular exercise may actually prevent headaches from coming. If you have exercise-induced headaches, please make sure that you drink plenty  of fluid before and after exercising and that you do not over do it and do not overheat.    Migraine Headache A migraine headache is an intense, throbbing pain on one or both sides of your head. Recurrent migraines keep coming back. A migraine can last for 30 minutes to several hours. CAUSES  The exact cause of a migraine headache is not always known. However, a migraine may be caused when nerves in the brain become irritated and release chemicals that cause inflammation. This causes pain. Certain things may also trigger migraines, such as:   Alcohol.  Smoking.  Stress.  Menstruation.  Aged cheeses.  Foods or drinks that contain nitrates, glutamate, aspartame, or tyramine.  Lack of sleep.  Chocolate.  Caffeine.  Hunger.  Physical exertion.  Fatigue.  Medicines used to treat chest pain (nitroglycerine), birth control pills, estrogen, and some blood pressure medicines. SYMPTOMS   Pain on one or both sides of your head.  Pulsating or throbbing pain.  Severe pain that prevents daily activities.  Pain that is aggravated by any physical activity.  Nausea, vomiting, or both.  Dizziness.  Pain with exposure to bright lights, loud noises, or activity.  General sensitivity to bright lights, loud noises, or smells. Before you get a migraine, you may get warning signs that a migraine is coming (aura). An aura may include:  Seeing flashing lights.  Seeing bright spots, halos, or zigzag lines.  Having tunnel vision or blurred vision.  Having feelings of numbness or tingling.  Having trouble talking.  Having muscle weakness.  DIAGNOSIS  A recurrent migraine headache is often diagnosed based on:  Symptoms.  Physical examination.  A CT scan or MRI of your head. These imaging tests cannot diagnose migraines but can help rule out other causes of headaches.  TREATMENT  Medicines may be given for pain and nausea. Medicines can also be given to help prevent  recurrent migraines. HOME CARE INSTRUCTIONS  Only take over-the-counter or prescription medicines for pain or discomfort as directed by your health care provider. The use of long-term narcotics is not recommended.  Lie down in a dark, quiet room when you have a migraine.  Keep a journal to find out what may trigger your migraine headaches. For example, write down:  What you eat and drink.  How much sleep you get.  Any change to your diet or medicines.  Limit alcohol consumption.  Quit smoking if you smoke.  Get 7-9 hours of sleep, or as recommended by your health care provider.  Limit stress.  Keep lights dim if bright lights bother you and make your migraines worse. SEEK MEDICAL CARE IF:   You do not get relief from the medicines given to you.  You have a recurrence of pain.  You have a fever. SEEK IMMEDIATE MEDICAL CARE IF:  Your migraine becomes severe.  You have a stiff neck.  You have loss of vision.  You have muscular weakness or loss of muscle control.  You start losing your balance or have trouble walking.  You feel faint or pass out. You have severe symptoms that are different from your first symptoms. MAKE SURE YOU:   Understand these instructions.  Will watch your condition.  Will get help right away if you are not doing well or get worse.   This information is not intended to replace advice given to you by your health care provider. Make sure you discuss any questions you have with your health care provider.   Document Released: 04/11/2001 Document Revised: 08/07/2014 Document Reviewed: 03/24/2013 Elsevier Interactive Patient Education 2016 [employer].  Common Migraine Triggers   Foods Aged cheese, alcohol, nuts, chocolate, yogurt, onions, figs, liver, caffeinated foods and beverages, monosodium glutamate (MSG), smoked or pickled fish/meat, nitrate/nitrate preserved foods (hotdogs, pepperoni, salami) tyramine  Medications Antibiotics  (tetracycline, griseofulvin), antihypertensives (nifedipine, captopril), hormones (oral contraceptives, estrogens), histamine-2 blockers (cimetidine, raniidine, vasodilators (nitroglycerine, isosorbide dinitrate)  Sensory Stimuli Flickering/bright/fluorescent lights, bright sunlight, odors (perfume, chemicals, cigarette smoke)  Lifestyle Changes Time zones, sleep patterns, eating habits, caffeine withdrawal stress  Other Menstrual cycle, weather/season/air pressure changes, high altitude  Adapted from [place] and [place], [place]. [facility] [place]; [place] and [place]. Conquering Headache, 1998  Hormonal variations also are believed to play a part.  Fluctuations of the female hormone estrogen (such as just before menstruation) affect a chemical called serotonin-when serotonin levels in the brain fall, the dilation (expansion) of blood vessels in the brain that is characteristic of migraine often follows.  Many factors or "triggers" can start a migraine.  In people who get migraines, most experts think certain activities or foods may trigger temporary changes in the blood vessels around the brain.  Swelling of these blood vessels may cause pain in the nearby nerves.  Allergy Headaches:  Hotdogs Milk  Onions  Thyme Bacon  Chocolate Garlic  Nutmeg Ham  Dark Cola Pork  Cinnamon Salami  Nuts  Egg  Ginger Sausage Red wine Cloves  Cheddar Cheese Caffeine

## 2017-05-23 LAB — CBC WITH DIFFERENTIAL/PLATELET
BASOS ABS: 32 {cells}/uL (ref 0–200)
Basophils Relative: 0.4 %
EOS ABS: 72 {cells}/uL (ref 15–500)
Eosinophils Relative: 0.9 %
HEMATOCRIT: 38.5 % (ref 35.0–45.0)
HEMOGLOBIN: 12.8 g/dL (ref 11.7–15.5)
LYMPHS ABS: 2096 {cells}/uL (ref 850–3900)
MCH: 28.8 pg (ref 27.0–33.0)
MCHC: 33.2 g/dL (ref 32.0–36.0)
MCV: 86.7 fL (ref 80.0–100.0)
MPV: 10.5 fL (ref 7.5–12.5)
Monocytes Relative: 9.2 %
Neutro Abs: 5064 cells/uL (ref 1500–7800)
Neutrophils Relative %: 63.3 %
Platelets: 279 10*3/uL (ref 140–400)
RBC: 4.44 10*6/uL (ref 3.80–5.10)
RDW: 12.4 % (ref 11.0–15.0)
Total Lymphocyte: 26.2 %
WBC mixed population: 736 cells/uL (ref 200–950)
WBC: 8 10*3/uL (ref 3.8–10.8)

## 2017-05-23 LAB — LIPID PANEL
CHOL/HDL RATIO: 2.3 (calc) (ref <5.0)
Cholesterol: 199 mg/dL (ref <200)
HDL: 86 mg/dL (ref >50)
LDL CHOLESTEROL (CALC): 98 mg/dL
NON-HDL CHOLESTEROL (CALC): 113 mg/dL (ref <130)
TRIGLYCERIDES: 65 mg/dL (ref <150)

## 2017-05-23 LAB — URINALYSIS, ROUTINE W REFLEX MICROSCOPIC
BILIRUBIN URINE: NEGATIVE
GLUCOSE, UA: NEGATIVE
Hgb urine dipstick: NEGATIVE
Ketones, ur: NEGATIVE
Leukocytes, UA: NEGATIVE
Nitrite: NEGATIVE
Protein, ur: NEGATIVE
SPECIFIC GRAVITY, URINE: 1.005 (ref 1.001–1.03)
pH: 7 (ref 5.0–8.0)

## 2017-05-23 LAB — BASIC METABOLIC PANEL WITH GFR
BUN/Creatinine Ratio: 14 (calc) (ref 6–22)
BUN: 14 mg/dL (ref 7–25)
CALCIUM: 10.1 mg/dL (ref 8.6–10.4)
CO2: 29 mmol/L (ref 20–32)
CREATININE: 1.01 mg/dL — AB (ref 0.50–0.99)
Chloride: 103 mmol/L (ref 98–110)
GFR, EST NON AFRICAN AMERICAN: 60 mL/min/{1.73_m2} (ref >=60)
GFR, Est African American: 69 mL/min/{1.73_m2} (ref >=60)
GLUCOSE: 90 mg/dL (ref 65–99)
Potassium: 4.2 mmol/L (ref 3.5–5.3)
SODIUM: 141 mmol/L (ref 135–146)

## 2017-05-23 LAB — MICROALBUMIN / CREATININE URINE RATIO
CREATININE, URINE: 36 mg/dL (ref 20–275)
Microalb Creat Ratio: 44 mcg/mg creat — ABNORMAL HIGH (ref <30)
Microalb, Ur: 1.6 mg/dL

## 2017-05-23 LAB — MAGNESIUM: MAGNESIUM: 2.2 mg/dL (ref 1.5–2.5)

## 2017-05-23 LAB — HEPATIC FUNCTION PANEL
AG RATIO: 2 (calc) (ref 1.0–2.5)
ALBUMIN MSPROF: 4.6 g/dL (ref 3.6–5.1)
ALT: 13 U/L (ref 6–29)
AST: 26 U/L (ref 10–35)
Alkaline phosphatase (APISO): 30 U/L — ABNORMAL LOW (ref 33–130)
BILIRUBIN DIRECT: 0.1 mg/dL (ref 0.0–0.2)
BILIRUBIN TOTAL: 0.5 mg/dL (ref 0.2–1.2)
GLOBULIN: 2.3 g/dL (ref 1.9–3.7)
Indirect Bilirubin: 0.4 mg/dL (calc) (ref 0.2–1.2)
Total Protein: 6.9 g/dL (ref 6.1–8.1)

## 2017-05-23 LAB — TSH: TSH: 1.31 m[IU]/L (ref 0.40–4.50)

## 2017-05-23 LAB — IRON, TOTAL/TOTAL IRON BINDING CAP
%SAT: 31 % (calc) (ref 11–50)
Iron: 107 ug/dL (ref 45–160)
TIBC: 346 mcg/dL (calc) (ref 250–450)

## 2017-05-24 DIAGNOSIS — M47812 Spondylosis without myelopathy or radiculopathy, cervical region: Secondary | ICD-10-CM | POA: Diagnosis not present

## 2017-05-24 DIAGNOSIS — M47816 Spondylosis without myelopathy or radiculopathy, lumbar region: Secondary | ICD-10-CM | POA: Diagnosis not present

## 2017-05-28 ENCOUNTER — Other Ambulatory Visit: Payer: Self-pay | Admitting: Gastroenterology

## 2017-06-08 ENCOUNTER — Other Ambulatory Visit: Payer: Self-pay | Admitting: Physician Assistant

## 2017-06-11 ENCOUNTER — Other Ambulatory Visit: Payer: Self-pay | Admitting: Physician Assistant

## 2017-06-14 ENCOUNTER — Ambulatory Visit: Payer: BLUE CROSS/BLUE SHIELD | Admitting: Gastroenterology

## 2017-06-14 ENCOUNTER — Encounter: Payer: Self-pay | Admitting: Gastroenterology

## 2017-06-14 VITALS — BP 90/58 | HR 64 | Ht 61.0 in | Wt 99.2 lb

## 2017-06-14 DIAGNOSIS — R0989 Other specified symptoms and signs involving the circulatory and respiratory systems: Secondary | ICD-10-CM

## 2017-06-14 DIAGNOSIS — R6889 Other general symptoms and signs: Secondary | ICD-10-CM

## 2017-06-14 NOTE — Patient Instructions (Signed)
Please start your nasal spray and antibiotics recommended by your ear, nose and throat physician. Follow up with them in the office.   Follow up with Dr. Fuller Plan as needed.  Thank you for choosing me and [facility].  Pricilla Riffle. Dagoberto Ligas., MD., [facility]

## 2017-06-14 NOTE — Progress Notes (Signed)
    History of Present Illness: This is a 61 year old female with throat clearing and sinus congestion. Dr. Meredith Leeds, ENT, he evaluated with her in September and recommended a course of doxycycline and Atrovent spray.  He felt the [facility] diagnoses were postnasal drip from chronic rhinitis or sinusitis or vasomotor rhinitis or tracheobronchitis or GERD. She has not used either medication by her report.  She has persistent throat clearing that has not responded to ranitidine 300 mg twice daily.  She was very reluctant to use a PPI due to potential side effects  Current Medications, Allergies, Past Medical History, Past Surgical History, Family History and Social History were reviewed in [employer] record.  Physical Exam: General: Well developed, well nourished, no acute distress Head: Normocephalic and atraumatic Eyes:  sclerae anicteric, EOMI Ears: Normal auditory acuity Mouth: No deformity or lesions Lungs: Clear throughout to auscultation Heart: Regular rate and rhythm; no murmurs, rubs or bruits Abdomen: Soft, non tender and non distended. No masses, hepatosplenomegaly or hernias noted. Normal Bowel sounds Musculoskeletal: Symmetrical with no gross deformities  Pulses:  Normal pulses noted Extremities: No clubbing, cyanosis, edema or deformities noted Neurological: Alert oriented x 4, grossly nonfocal Psychological:  Alert and cooperative. Normal mood and affect  Assessment and Recommendations:  1.  Chronic throat clearing.  Postnasal drip versus GERD with LPR.  Advised her to contact her ENT and follow through with taking Atrovent nasal spray and the course of doxycycline as recommended by her ENT physician.  Continue ranitidine 300 mg twice daily and standard antireflux measures for now.  If her symptoms do not respond to ENT treatment we will advise changing to a PPI bid and proceeding with EGD for further evaluation.

## 2017-06-19 DIAGNOSIS — M545 Low back pain: Secondary | ICD-10-CM | POA: Diagnosis not present

## 2017-06-19 DIAGNOSIS — M1288 Other specific arthropathies, not elsewhere classified, other specified site: Secondary | ICD-10-CM | POA: Diagnosis not present

## 2017-06-19 DIAGNOSIS — M47817 Spondylosis without myelopathy or radiculopathy, lumbosacral region: Secondary | ICD-10-CM | POA: Diagnosis not present

## 2017-06-28 ENCOUNTER — Encounter (INDEPENDENT_AMBULATORY_CARE_PROVIDER_SITE_OTHER): Payer: Self-pay

## 2017-07-02 ENCOUNTER — Other Ambulatory Visit: Payer: Self-pay | Admitting: Gastroenterology

## 2017-07-19 DIAGNOSIS — M47817 Spondylosis without myelopathy or radiculopathy, lumbosacral region: Secondary | ICD-10-CM | POA: Diagnosis not present

## 2017-08-06 ENCOUNTER — Encounter (INDEPENDENT_AMBULATORY_CARE_PROVIDER_SITE_OTHER): Payer: Self-pay

## 2017-08-07 ENCOUNTER — Encounter (INDEPENDENT_AMBULATORY_CARE_PROVIDER_SITE_OTHER): Payer: Self-pay

## 2017-10-09 ENCOUNTER — Encounter (INDEPENDENT_AMBULATORY_CARE_PROVIDER_SITE_OTHER): Payer: Self-pay

## 2017-10-24 ENCOUNTER — Ambulatory Visit: Payer: BLUE CROSS/BLUE SHIELD

## 2017-11-18 NOTE — Progress Notes (Deleted)
Assessment and Plan:   Hypertension -Continue medication, monitor blood pressure at home. Continue DASH diet.  Reminder to go to the ER if any CP, SOB, nausea, dizziness, severe HA, changes vision/speech, left arm numbness and tingling and jaw pain.   Cholesterol -Continue diet and exercise. Check cholesterol.    Prediabetes  -Continue diet and exercise. Check A1C   Vitamin D Def - check level and continue medications.   Gastroesophageal reflux disease with esophagitis Check labs, get on nexium/carafate, refer to GI for possible EGD - stop ibuprofen -     Ambulatory referral to Gastroenterology -     sucralfate (CARAFATE) 1 g tablet; Take 1 tablet (1 g total) by mouth 3 (three) times daily with meals as needed. -     esomeprazole (NEXIUM) 40 MG capsule; Take 1 capsule (40 mg total) by mouth daily.    Continue diet and meds as discussed. Further disposition pending results of labs. Over 30 minutes of exam, counseling, chart review, and critical decision making was performed  Future Appointments  Date Time Provider [place]  11/20/2017  3:30 PM Vicie Mutters, PA-C [facility] None  11/23/2017  2:00 PM Philemon Kingdom, MD [id number] None  06/04/2018  3:00 PM Vicie Mutters, PA-C [facility] None     HPI 62 y.o. female  presents for 3 month follow up on hypertension, cholesterol, prediabetes, and vitamin D deficiency.   Her blood pressure has been controlled at home, today their BP is    She does not workout due to chronic neck pain, followed by [facility]. She denies chest pain, shortness of breath, dizziness.  She is on cholesterol medication, crestor 10mg  and denies myalgias. Her cholesterol is at goal. The cholesterol last visit was:   Lab Results  Component Value Date   CHOL 199 05/22/2017   HDL 86 05/22/2017   LDLCALC 98 05/22/2017   TRIG 65 05/22/2017   CHOLHDL 2.3 05/22/2017    She has been working on diet and exercise for prediabetes, and denies paresthesia of  the feet, polydipsia, polyuria and visual disturbances. Last A1C in the office was:  Lab Results  Component Value Date   HGBA1C 5.2 11/08/2016  Patient is on Vitamin D supplement.   Lab Results  Component Value Date   VD25OH 67 11/08/2016   She is on thyroid medication. Her medication was not changed last visit.   Lab Results  Component Value Date   TSH 1.31 05/22/2017  .  She has had GERD for several years, states OTC, zantac, omperazole and protonix without help. States can drink water and have reflux. No dark black stool/blood in stool. She takes ibuprofen regularly for migraines/OA, takes daily, follows neurology.  No ETOH.   Current Medications:  Current Outpatient Medications on File Prior to Visit  Medication Sig Dispense Refill  . amLODipine (NORVASC) 5 MG tablet TAKE 1 TABLET BY MOUTH ONCE DAILY 90 tablet 1  . Cholecalciferol (VITAMIN D) 2000 units CAPS Take 6,000 capsules by mouth daily.    . Coenzyme Q10 (CO Q 10) 100 MG CAPS Take 1 capsule by mouth daily.    . Cyanocobalamin (B12 LIQUID HEALTH BOOSTER PO) Take by mouth. 1/2 -3/4 eye dropper daily    . diazepam (VALIUM) 10 MG tablet Take 5 mg by mouth at bedtime as needed for anxiety.     . Flaxseed, Linseed, (FLAX SEEDS PO) Take by mouth daily.    Marland Kitchen levothyroxine (SYNTHROID, LEVOTHROID) 25 MCG tablet Take 12.5 mcg by mouth. 5 days a  week    . magnesium gluconate (MAGONATE) 500 MG tablet Take 500 mg by mouth daily.     . Melatonin 1 MG TABS Take 1 tablet by mouth at bedtime.    . Omega-3 Fatty Acids (FISH OIL) 1200 MG CAPS Take 1 capsule by mouth daily.    Marland Kitchen OVER THE COUNTER MEDICATION Takes folic acid 1 tab daily.    Marland Kitchen OVER THE COUNTER MEDICATION Vitamin B 6 1 daily    . ranitidine (ZANTAC) 300 MG tablet TAKE 1 TABLET(300 MG) BY MOUTH TWICE DAILY 60 tablet 5  . Riboflavin 100 MG CAPS Take 200 mg by mouth daily.    . rizatriptan (MAXALT) 10 MG tablet Take 10 mg by mouth as needed for migraine. May repeat in 2 hours if  needed    . rosuvastatin (CRESTOR) 10 MG tablet TAKE 1 TABLET BY MOUTH ONCE DAILY 90 tablet 1  . TURMERIC PO Take 1 capsule by mouth daily.     No current facility-administered medications on file prior to visit.    Medical History:  Past Medical History:  Diagnosis Date  . Carpal tunnel syndrome on right   . History of concussion    1972, 1977, 1983  . Hyperlipidemia   . Hypertension   . Migraines    pain clinic [place]  . Osteoporosis    Allergies:  Allergies  Allergen Reactions  . Aleve [Naproxen Sodium] Anaphylaxis and Other (See Comments)    GI Upset GI Upset GI Upset   . Lipitor [Atorvastatin] Anaphylaxis and Other (See Comments)  . Niacin And Related Anaphylaxis  . Omnaris [Ciclesonide] Anaphylaxis    migraine migraine  . Ceftin [Cefuroxime Axetil] Rash    Other Reaction: Rash and gi upset Other Reaction: Rash and gi upset  . Other Other (See Comments)  . Levaquin [Levofloxacin In D5w]     Muscle cramps  . Simvastatin   . Zetia [Ezetimibe]      Review of Systems:  Review of Systems  Constitutional: Negative.   HENT: Negative for congestion, ear discharge, ear pain, hearing loss, nosebleeds, sore throat and tinnitus.   Eyes: Negative.   Respiratory: Negative.  Negative for cough and stridor.   Cardiovascular: Negative.  Negative for chest pain.  Gastrointestinal: Negative.   Genitourinary: Negative.   Musculoskeletal: Positive for back pain, joint pain, myalgias and neck pain.  Skin: Negative.   Neurological: Positive for headaches (better). Negative for dizziness, tingling, tremors, sensory change, speech change, focal weakness, seizures and loss of consciousness.  Endo/Heme/Allergies: Negative.   Psychiatric/Behavioral: Negative for depression, hallucinations, memory loss, substance abuse and suicidal ideas. The patient is not nervous/anxious and does not have insomnia.     Family history- Review and unchanged Social history- Review and  unchanged Physical Exam: There were no vitals taken for this visit. Wt Readings from Last 3 Encounters:  06/14/17 99 lb 4 oz (45 kg)  05/22/17 100 lb 3.2 oz (45.5 kg)  03/01/17 97 lb 9.6 oz (44.3 kg)   General Appearance: Well nourished, in no apparent distress. Eyes: PERRLA, EOMs, conjunctiva no swelling or erythema Sinuses: No Frontal/maxillary tenderness ENT/Mouth: Ext aud canals clear, TMs without erythema, bulging. No erythema, swelling, or exudate on post pharynx.  Tonsils not swollen or erythematous. Hearing normal.  Neck: Supple, thyroid normal.  Respiratory: Respiratory effort normal, BS equal bilaterally without rales, rhonchi, wheezing or stridor.  Cardio: RRR with no MRGs. Brisk peripheral pulses without edema.  Abdomen: Soft, + BS,  Non tender, no guarding,  rebound, hernias, masses. Lymphatics: Non tender without lymphadenopathy.  Musculoskeletal: Full ROM, 5/5 strength, Normal gait Skin: Warm, dry without rashes, lesions, ecchymosis.  Neuro: Cranial nerves intact. Normal muscle tone, no cerebellar symptoms. Psych: Awake and oriented X 3, normal affect, Insight and Judgment appropriate.    Vicie Mutters, PA-C 9:16 AM [facility]

## 2017-11-20 ENCOUNTER — Ambulatory Visit: Payer: Self-pay | Admitting: Physician Assistant

## 2017-11-20 ENCOUNTER — Telehealth: Payer: Self-pay | Admitting: *Deleted

## 2017-11-20 NOTE — Telephone Encounter (Signed)
Pt called 2 times saying she was lost & missed turn? Asked pt how she felt? Per pt feels ok & just is lost.  Will call back tomorrow to r/s

## 2017-11-21 ENCOUNTER — Encounter: Payer: Self-pay | Admitting: Internal Medicine

## 2017-11-21 DIAGNOSIS — M47816 Spondylosis without myelopathy or radiculopathy, lumbar region: Secondary | ICD-10-CM | POA: Diagnosis not present

## 2017-11-21 DIAGNOSIS — M5136 Other intervertebral disc degeneration, lumbar region: Secondary | ICD-10-CM | POA: Diagnosis not present

## 2017-11-21 DIAGNOSIS — M47812 Spondylosis without myelopathy or radiculopathy, cervical region: Secondary | ICD-10-CM | POA: Diagnosis not present

## 2017-11-21 DIAGNOSIS — M542 Cervicalgia: Secondary | ICD-10-CM | POA: Diagnosis not present

## 2017-11-23 ENCOUNTER — Ambulatory Visit: Payer: BLUE CROSS/BLUE SHIELD | Admitting: Internal Medicine

## 2017-11-23 ENCOUNTER — Encounter: Payer: Self-pay | Admitting: Internal Medicine

## 2017-11-23 VITALS — BP 118/84 | HR 97 | Resp 16 | Ht 61.2 in | Wt 101.2 lb

## 2017-11-23 DIAGNOSIS — E559 Vitamin D deficiency, unspecified: Secondary | ICD-10-CM | POA: Diagnosis not present

## 2017-11-23 DIAGNOSIS — M810 Age-related osteoporosis without current pathological fracture: Secondary | ICD-10-CM | POA: Diagnosis not present

## 2017-11-23 NOTE — Patient Instructions (Signed)
Please stop at the lab.  Please continue with the calcium and vitamin D supplements.  We will schedule a new DXA scan and call you with the time and date.  Please come back for a follow-up appointment in 1 year.

## 2017-11-23 NOTE — Progress Notes (Signed)
Patient ID: Isabel [name], female   DOB: 1956-03-16, 62 y.o.   MRN: [id number]    HPI  Isabel [name] is a 61 y.o.-year-old female, returning for follow-up for osteoporosis.  Last visit a year ago.  Pt was dx with OP in ~2013  Reviewed most recent DXA scans: Date L1-L4 T score FN T score  [facility]  -3.0  RFN: -1.8 LFN: -2.1   10/07/2013 [facility])  -3.1 (-5.6%*)  RFN: -1.9 LFN: -2.2   07/10/2011 [facility])  -2.8  RFN: n/a LFN: n/a   She only had one fracture as a child: - R arm - in 73 (24 y/o)   She fell from the kitchen counter in 2000.  No fractures.  No dizziness.  History of an episode of vertigo, not recent.  No orthostasis/poor vision  Previous OP treatments: - Alendronate: for many years >> stopped because of possible ONJ.  She tells me that the pani is migratory >> ? Nerve pain. Reviewed the reports of her 3-D reconstructed images of her mouth from 09/06/2016: "The region of interest demonstrates radiographic signs that may be suggestive of osteonecrosis/osteomyelitis. If the patient has a history of taking bisphosphonate drugs, the region of interest would correlate with osteonecrosis. Otherwise, the region of interest may be osteomyelitis. Clinical correlation is recommended".  At last visit, we checked an N-telopeptide, and this was not suppressed (ideally lower than 10), but not very high either (higher than 18): Component     Latest Ref Rng & Units 11/23/2016  N-Telopeptide     6.2 - 19.0 nM BCE 15.4   She has a history of vitamin D deficiency, but more recently, her levels have been normal: Lab Results  Component Value Date   VD25OH 67 11/08/2016   VD25OH 56 05/03/2016   VD25OH 65 05/04/2015   VD25OH 60 12/17/2014   VD25OH 49 09/02/2014   VD25OH 63 04/23/2014   VD25OH 82 09/08/2013   Pt is on calcium 500 mg daily and vitamin D 6000 units daily she also eats dairy and green leafy vegetables.  No weightbearing exercises.  She was practicing yoga x 9 years >> then stopped b/c pain with movement.  She continues to walk her dogs <2 miles a day.  She does not take high vitamin A doses.   It is unclear when she started menopause as she was taking hormonal tx for severe HAs (from severe cervical OA). She was also on Methadone, tried Botox. She lately had a nerve burn - [facility].  Pt does not have a FH of osteoporosis.  Mother with a dowager hump.  No history of hyper/hypocalcemia or hyperparathyroidism.  No kidney stones: Lab Results  Component Value Date   CALCIUM 10.1 05/22/2017   CALCIUM 10.0 02/26/2017   CALCIUM 10.1 11/08/2016   CALCIUM 10.0 05/03/2016   CALCIUM 9.8 11/03/2015   CALCIUM 10.2 05/04/2015   CALCIUM 10.0 12/17/2014   CALCIUM 10.1 09/02/2014   CALCIUM 9.6 04/23/2014   CALCIUM 10.0 09/08/2013   No history of thyrotoxicosis.  Reviewed recent TSH levels: Lab Results  Component Value Date   TSH 1.31 05/22/2017   TSH 1.65 02/26/2017   TSH 1.62 11/08/2016   TSH 1.44 05/03/2016   TSH 1.66 11/03/2015   No history of CKD. Last BUN/Cr: Lab Results  Component Value Date   BUN 14 05/22/2017   CREATININE 1.01 (H) 05/22/2017   She has extensive FH of Alzheimer on her father's side.  ROS: Constitutional: no weight gain/no weight  loss, no fatigue, no subjective hyperthermia, + subjective hypothermia Eyes: no blurry vision, no xerophthalmia ENT: no sore throat, no nodules palpated in throat, no dysphagia, no odynophagia, no hoarseness Cardiovascular: no CP/no SOB/no palpitations/no leg swelling Respiratory: no cough/no SOB/no wheezing Gastrointestinal: no N/no V/no D/no C/no acid reflux Musculoskeletal: no muscle aches/+ joint aches Skin: no rashes, no hair loss Neurological: no tremors/no numbness/no tingling/no dizziness, + HAs  I reviewed pt's medications, allergies, PMH, social hx, family hx, and changes were documented in the history of present illness. Otherwise, unchanged from my initial  visit note.  Past Medical History:  Diagnosis Date  . Carpal tunnel syndrome on right   . History of concussion    1972, 1977, 1983  . Hyperlipidemia   . Hypertension   . Migraines    pain clinic [place]  . Osteoporosis    Past Surgical History:  Procedure Laterality Date  . 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 fractured right wrist    . LEEP     early 20's  . MOUTH SURGERY    . WRIST SURGERY Right 1973   Social History   Social History  . Marital status: Married    Spouse name: N/A  . Number of children: 0   Occupational History  . Home maker   Social History Main Topics  . Smoking status: Former Smoker    Quit date: 06/07/1994  . Smokeless tobacco: Never Used  . Alcohol use Yes     Comment: rare  . Drug use: No   Current Outpatient Medications on File Prior to Visit  Medication Sig Dispense Refill  . amLODipine (NORVASC) 5 MG tablet TAKE 1 TABLET BY MOUTH ONCE DAILY 90 tablet 1  . Cholecalciferol (VITAMIN D) 2000 units CAPS Take 6,000 capsules by mouth daily.    . Coenzyme Q10 (CO Q 10) 100 MG CAPS Take 1 capsule by mouth daily.    . Cyanocobalamin (B12 LIQUID HEALTH BOOSTER PO) Take by mouth. 1/2 -3/4 eye dropper daily    . diazepam (VALIUM) 10 MG tablet Take 5 mg by mouth at bedtime as needed for anxiety.     . Flaxseed, Linseed, (FLAX SEEDS PO) Take by mouth daily.    Marland Kitchen levothyroxine (SYNTHROID, LEVOTHROID) 25 MCG tablet Take 12.5 mcg by mouth. 5 days a week    . magnesium gluconate (MAGONATE) 500 MG tablet Take 500 mg by mouth daily.     . Melatonin 1 MG TABS Take 1 tablet by mouth at bedtime.    . Omega-3 Fatty Acids (FISH OIL) 1200 MG CAPS Take 1 capsule by mouth daily.    Marland Kitchen OVER THE COUNTER MEDICATION Takes folic acid 1 tab daily.    Marland Kitchen OVER THE COUNTER MEDICATION Vitamin B 6 1 daily    . ranitidine (ZANTAC) 300 MG tablet TAKE 1 TABLET(300 MG) BY MOUTH TWICE DAILY 60 tablet 5  . Riboflavin 100 MG CAPS Take 200 mg by mouth  daily.    . rizatriptan (MAXALT) 10 MG tablet Take 10 mg by mouth as needed for migraine. May repeat in 2 hours if needed    . rosuvastatin (CRESTOR) 10 MG tablet TAKE 1 TABLET BY MOUTH ONCE DAILY 90 tablet 1  . TURMERIC PO Take 1 capsule by mouth daily.     No current facility-administered medications on file prior to visit.    Allergies  Allergen Reactions  . Aleve [Naproxen Sodium] Anaphylaxis and Other (See Comments)  GI Upset GI Upset GI Upset   . Lipitor [Atorvastatin] Anaphylaxis and Other (See Comments)  . Niacin And Related Anaphylaxis  . Omnaris [Ciclesonide] Anaphylaxis    migraine migraine  . Ceftin [Cefuroxime Axetil] Rash    Other Reaction: Rash and gi upset Other Reaction: Rash and gi upset  . Other Other (See Comments)  . Levaquin [Levofloxacin In D5w]     Muscle cramps  . Simvastatin   . Zetia [Ezetimibe]    Family History  Problem Relation Age of Onset  . Depression Mother   . Migraines Mother   . Stroke Mother   . Alzheimer's disease Father   . Hyperlipidemia Father   . Hypertension Father   . Colon cancer Neg Hx    PE: BP 118/84   Pulse 97   Resp 16   Ht 5' 1.2" (1.554 m)   Wt 101 lb 3.2 oz (45.9 kg)   SpO2 98%   BMI 19.00 kg/m  Wt Readings from Last 3 Encounters:  11/23/17 101 lb 3.2 oz (45.9 kg)  06/14/17 99 lb 4 oz (45 kg)  05/22/17 100 lb 3.2 oz (45.5 kg)   Constitutional: thin, in NAD Eyes: PERRLA, EOMI, no exophthalmos ENT: moist mucous membranes, no thyromegaly, no cervical lymphadenopathy Cardiovascular: tachycardia, RR, No MRG Respiratory: CTA B Gastrointestinal: abdomen soft, NT, ND, BS+ Musculoskeletal: no deformities, strength intact in all 4 Skin: moist, warm, no rashes Neurological: no tremor with outstretched hands, DTR normal in all 4  Assessment: 1. Osteoporosis  2. Vit D deficiency  Plan: 1. Osteoporosis - likely age-related and postmenopausal.  She also has family history of osteoporosis. -Reviewing her  most recent bone density scans, she does have an increased risk for fractures especially at the level of the spine.  The T-scores remained stable between the last 2 scans from 2015 and 2017, however, these are not directly comparable since they were done in different centers.  She is due for another bone density scan now, which will order. - She is getting a good amount of calcium and vitamin D from diet and supplements.  We will continue the same doses and will check her calcium and vitamin D levels today. - Again discussed fall precautions. - she finished 1 year of Osteostrong >> 89$ per mo, not covered by insurance - Discussed about the importance of weightbearing exercises. - We reviewed together her previous N-telopeptide results and I explained what it means (please see HPI).  Since the N-telopeptide was not very high and her scores were stable between the last 2 DXA scans, and also because of the possibility of ONJ, we decided to follow her without pharmacological treatment until we obtained a new DEXA scan.  After we obtain the result, we did discuss about the potential of starting Prolia, which is a slightly lower risk of ONJ compared to bisphosphonates. However, I doubt her jaw pain episode had any connection with her Bisphosphonate use as the pain is actually migratory (probably nerve pain) - I will discuss with her about the bone density results when they return - For now we will check the following labs: Vitamin D, BMP  - will see pt back in a year  2. Vit D deficiency - continues on vitamin D 6000 units daily - may forget doses - last level was reviewed >> normal - recheck today  She did not stop at the lab but had a CMP later: Component     Latest Ref Rng & Units 11/27/2017  Glucose     65 - 99 mg/dL 96  BUN     7 - 25 mg/dL 13  Creatinine     0.50 - 0.99 mg/dL 1.15 (H)  GFR, Est Non African American     > OR = 60 mL/min/1.40m2 51 (L)  GFR, Est African American     > OR = 60  mL/min/1.70m2 59 (L)  BUN/Creatinine Ratio     6 - 22 (calc) 11  Sodium     135 - 146 mmol/L 143  Potassium     3.5 - 5.3 mmol/L 4.0  Chloride     98 - 110 mmol/L 101  CO2     20 - 32 mmol/L 31  Calcium     8.6 - 10.4 mg/dL 10.5 (H)  Total Protein     6.1 - 8.1 g/dL 7.2  Albumin MSPROF     3.6 - 5.1 g/dL 4.9  Globulin     1.9 - 3.7 g/dL (calc) 2.3  AG Ratio     1.0 - 2.5 (calc) 2.1  Total Bilirubin     0.2 - 1.2 mg/dL 0.6  Alkaline phosphatase (APISO)     33 - 130 U/L 33  AST     10 - 35 U/L 28  ALT     6 - 29 U/L 14  However, a vitamin D level was not checked then...  CC:  Dr. Amil Amen Dr. Lazaro Arms   Philemon Kingdom, MD PhD [facility] Endocr[facility]ology

## 2017-11-25 NOTE — Progress Notes (Signed)
Assessment and Plan:   Elevated BP without diagnosis of hypertension - continue medications, DASH diet, exercise and monitor at home. Call if greater than 130/80.   Mixed hyperlipidemia -continue medications, check lipids, decrease fatty foods, increase activity.  -     Lipid panel  Medication management -     CBC with Differential/Platelet -     COMPLETE METABOLIC PANEL WITH GFR  Hypothyroidism, unspecified type Hypothyroidism-check TSH level, continue medications the same, reminded to take on an empty stomach 30-59mins before food -     TSH  Chronic nonintractable headache, unspecified headache type -     MR Brain W Wo Contrast; Future - patient with history of chronic migraine now with memory loss- get MRI rule out stroke, white matter changes, ETC, check labs, continue neuropsych testing  Memory loss -     Vitamin B12 -     Rocky mtn spotted fvr abs pnl(IgG+IgM) -     Homocysteine -     B. burgdorfi antibodies -     MR Brain W Wo Contrast; Future  B12 deficiency -     Vitamin B12   Continue diet and meds as discussed. Further disposition pending results of labs. Over 30 minutes of exam, counseling, chart review, and critical decision making was performed  Future Appointments  Date Time Provider [place]  04/16/2018 11:00 AM Kandis Nab, PsyD [facility] None  04/23/2018 10:45 AM LBN- NEUROPSYCH TECH [facility] None  05/23/2018  3:00 PM Kandis Nab, PsyD [facility] None  06/04/2018  3:00 PM Vicie Mutters, PA-C [facility] None  11/25/2018  2:00 PM Philemon Kingdom, MD [facility] None     HPI 62 y.o. female  presents for 3 month follow up on hypertension, cholesterol, prediabetes, and vitamin D deficiency.   She states she has been having memory issues, like last week she got lost while coming to the office, she is repeating things when she speaks, she no anomia. She spoke with the neuropysch, Dr. Joretta Bachelor, yesterday. She has a strong family  history of alzheimers. She has but back on her valium. She has not had imaging of her brain, she had had normal B12, normal HIV, lyme, RPR, CPK, aldolase, CCP, AntiDNA, ANA , RF.    Her blood pressure has been controlled at home, today their BP is BP: 102/66  She does not workout due to chronic neck pain, followed by [facility]. She denies chest pain, shortness of breath, dizziness.  She is on cholesterol medication, crestor 10mg  and denies myalgias. Her cholesterol is at goal. The cholesterol last visit was:   Lab Results  Component Value Date   CHOL 199 05/22/2017   HDL 86 05/22/2017   LDLCALC 98 05/22/2017   TRIG 65 05/22/2017   CHOLHDL 2.3 05/22/2017    She has been working on diet and exercise for prediabetes, and denies paresthesia of the feet, polydipsia, polyuria and visual disturbances. Last A1C in the office was:  Lab Results  Component Value Date   HGBA1C 5.2 11/08/2016  Patient is on Vitamin D supplement.   Lab Results  Component Value Date   VD25OH 67 11/08/2016   She is on thyroid medication. Her medication was not changed last visit.   Lab Results  Component Value Date   TSH 1.31 05/22/2017  .    Current Medications:  Current Outpatient Medications on File Prior to Visit  Medication Sig Dispense Refill  . amLODipine (NORVASC) 5 MG tablet TAKE 1 TABLET BY MOUTH ONCE DAILY 90 tablet 1  .  Cholecalciferol (VITAMIN D) 2000 units CAPS Take 6,000 capsules by mouth daily.    . Cyanocobalamin (B12 LIQUID HEALTH BOOSTER PO) Take by mouth. 1/2 -3/4 eye dropper daily    . diazepam (VALIUM) 10 MG tablet Take 5 mg by mouth at bedtime as needed for anxiety.     Marland Kitchen levothyroxine (SYNTHROID, LEVOTHROID) 25 MCG tablet Take 12.5 mcg by mouth. 5 days a week    . magnesium gluconate (MAGONATE) 500 MG tablet Take 500 mg by mouth daily.     . Melatonin 1 MG TABS Take 1 tablet by mouth at bedtime.    . Omega-3 Fatty Acids (FISH OIL) 1200 MG CAPS Take 1 capsule by mouth daily.    Marland Kitchen OVER THE  COUNTER MEDICATION Takes folic acid 1 tab daily.    Marland Kitchen OVER THE COUNTER MEDICATION Vitamin B 6 1 daily    . ranitidine (ZANTAC) 300 MG tablet TAKE 1 TABLET(300 MG) BY MOUTH TWICE DAILY 60 tablet 5  . Riboflavin 100 MG CAPS Take 200 mg by mouth daily.    . rizatriptan (MAXALT) 10 MG tablet Take 10 mg by mouth as needed for migraine. May repeat in 2 hours if needed    . rosuvastatin (CRESTOR) 10 MG tablet TAKE 1 TABLET BY MOUTH ONCE DAILY 90 tablet 1  . TURMERIC PO Take 1 capsule by mouth daily.     No current facility-administered medications on file prior to visit.    Medical History:  Past Medical History:  Diagnosis Date  . Carpal tunnel syndrome on right   . History of concussion    1972, 1977, 1983  . Hyperlipidemia   . Hypertension   . Migraines    pain clinic [place]  . Osteoporosis    Allergies:  Allergies  Allergen Reactions  . Aleve [Naproxen Sodium] Anaphylaxis and Other (See Comments)    GI Upset GI Upset GI Upset   . Lipitor [Atorvastatin] Anaphylaxis and Other (See Comments)  . Niacin And Related Anaphylaxis  . Omnaris [Ciclesonide] Anaphylaxis    migraine migraine  . Ceftin [Cefuroxime Axetil] Rash    Other Reaction: Rash and gi upset Other Reaction: Rash and gi upset  . Other Other (See Comments)  . Levaquin [Levofloxacin In D5w]     Muscle cramps  . Simvastatin   . Zetia [Ezetimibe]      Review of Systems:  Review of Systems  Constitutional: Negative.   HENT: Negative for congestion, ear discharge, ear pain, hearing loss, nosebleeds, sore throat and tinnitus.   Eyes: Negative.   Respiratory: Negative.  Negative for cough and stridor.   Cardiovascular: Negative.  Negative for chest pain.  Gastrointestinal: Negative.   Genitourinary: Negative.   Musculoskeletal: Positive for back pain, joint pain, myalgias and neck pain.  Skin: Negative.   Neurological: Positive for dizziness and headaches. Negative for tingling, tremors, sensory change, speech  change, focal weakness, seizures, loss of consciousness and weakness.  Endo/Heme/Allergies: Negative.   Psychiatric/Behavioral: Positive for memory loss. Negative for depression, hallucinations, substance abuse and suicidal ideas. The patient is not nervous/anxious and does not have insomnia.     Family history- Review and unchanged Social history- Review and unchanged Physical Exam: BP 102/66   Temp (!) 97.3 F (36.3 C)   Resp 14   Ht 5' 1.2" (1.554 m)   Wt 100 lb 3.2 oz (45.5 kg)   BMI 18.81 kg/m  Wt Readings from Last 3 Encounters:  11/27/17 100 lb 3.2 oz (45.5 kg)  11/23/17 101  lb 3.2 oz (45.9 kg)  06/14/17 99 lb 4 oz (45 kg)   General Appearance: Well nourished, in no apparent distress. Eyes: PERRLA, EOMs, conjunctiva no swelling or erythema Sinuses: No Frontal/maxillary tenderness ENT/Mouth: Ext aud canals clear, TMs without erythema, bulging. No erythema, swelling, or exudate on post pharynx.  Tonsils not swollen or erythematous. Hearing normal.  Neck: Supple, thyroid normal.  Respiratory: Respiratory effort normal, BS equal bilaterally without rales, rhonchi, wheezing or stridor.  Cardio: RRR with no MRGs. Brisk peripheral pulses without edema.  Abdomen: Soft, + BS,  Non tender, no guarding, rebound, hernias, masses. Lymphatics: Non tender without lymphadenopathy.  Musculoskeletal: Full ROM, 5/5 strength, Normal gait, no cog wheeling Skin: Warm, dry without rashes, lesions, ecchymosis.  Neuro: Cranial nerves intact. Normal muscle tone, no cerebellar symptoms. Psych: Awake and oriented X 3, unchanged flat affect, Insight and Judgment appropriate.    Vicie Mutters, PA-C 4:01 PM [facility]

## 2017-11-26 DIAGNOSIS — R413 Other amnesia: Secondary | ICD-10-CM | POA: Diagnosis not present

## 2017-11-26 DIAGNOSIS — G4700 Insomnia, unspecified: Secondary | ICD-10-CM | POA: Diagnosis not present

## 2017-11-26 DIAGNOSIS — G43709 Chronic migraine without aura, not intractable, without status migrainosus: Secondary | ICD-10-CM | POA: Diagnosis not present

## 2017-11-27 ENCOUNTER — Ambulatory Visit: Payer: BLUE CROSS/BLUE SHIELD | Admitting: Physician Assistant

## 2017-11-27 ENCOUNTER — Encounter: Payer: Self-pay | Admitting: Physician Assistant

## 2017-11-27 ENCOUNTER — Encounter: Payer: Self-pay | Admitting: Psychology

## 2017-11-27 VITALS — BP 102/66 | Temp 97.3°F | Resp 14 | Ht 61.2 in | Wt 100.2 lb

## 2017-11-27 DIAGNOSIS — E538 Deficiency of other specified B group vitamins: Secondary | ICD-10-CM | POA: Diagnosis not present

## 2017-11-27 DIAGNOSIS — R519 Headache, unspecified: Secondary | ICD-10-CM

## 2017-11-27 DIAGNOSIS — Z79899 Other long term (current) drug therapy: Secondary | ICD-10-CM | POA: Diagnosis not present

## 2017-11-27 DIAGNOSIS — E039 Hypothyroidism, unspecified: Secondary | ICD-10-CM

## 2017-11-27 DIAGNOSIS — R030 Elevated blood-pressure reading, without diagnosis of hypertension: Secondary | ICD-10-CM

## 2017-11-27 DIAGNOSIS — E782 Mixed hyperlipidemia: Secondary | ICD-10-CM | POA: Diagnosis not present

## 2017-11-27 DIAGNOSIS — R51 Headache: Secondary | ICD-10-CM | POA: Diagnosis not present

## 2017-11-27 DIAGNOSIS — R413 Other amnesia: Secondary | ICD-10-CM

## 2017-11-27 DIAGNOSIS — R5383 Other fatigue: Secondary | ICD-10-CM | POA: Diagnosis not present

## 2017-11-27 LAB — TSH: TSH: 1.37 mIU/L (ref 0.40–4.50)

## 2017-11-27 LAB — CBC WITH DIFFERENTIAL/PLATELET
Basophils Absolute: 33 cells/uL (ref 0–200)
Basophils Relative: 0.4 %
EOS PCT: 0.6 %
Eosinophils Absolute: 49 cells/uL (ref 15–500)
HCT: 40 % (ref 35.0–45.0)
Hemoglobin: 13.2 g/dL (ref 11.7–15.5)
Lymphs Abs: 2230 cells/uL (ref 850–3900)
MCH: 28.4 pg (ref 27.0–33.0)
MCHC: 33 g/dL (ref 32.0–36.0)
MCV: 86.2 fL (ref 80.0–100.0)
MPV: 10.6 fL (ref 7.5–12.5)
Monocytes Relative: 7.8 %
NEUTROS PCT: 64 %
Neutro Abs: 5248 cells/uL (ref 1500–7800)
PLATELETS: 296 10*3/uL (ref 140–400)
RBC: 4.64 10*6/uL (ref 3.80–5.10)
RDW: 13 % (ref 11.0–15.0)
TOTAL LYMPHOCYTE: 27.2 %
WBC mixed population: 640 cells/uL (ref 200–950)
WBC: 8.2 10*3/uL (ref 3.8–10.8)

## 2017-11-27 NOTE — Patient Instructions (Signed)
Nail fungus is very common and very difficult to treat. Sometime if caught early topical treatment can work. You can try terbinafine over the counter, apply twice daily for 1-3 months to your toenails. If this does not work there is a once a day oral medicine that you have to take for 3 months. Sometime this medication may have to be repeated. It is processed through your liver so we often ask patients to come back for a liver function test and to report any AB pain, yellowing of the skin, or fluid retention. If the topical treatment does not work for you we can start you on the pill.   Soak that foot, put terbinafine on that nail  Ingrown Toenail An ingrown toenail occurs when the corner or sides of your toenail grow into the surrounding skin. The big toe is most commonly affected, but it can happen to any of your toes. If your ingrown toenail is not treated, you will be at risk for infection. What are the causes? This condition may be caused by:  Wearing shoes that are too small or tight.  Injury or trauma, such as stubbing your toe or having your toe stepped on.  Improper cutting or care of your toenails.  Being born with (congenital) nail or foot abnormalities, such as having a nail that is too big for your toe.  What increases the risk? Risk factors for an ingrown toenail include:  Age. Your nails tend to thicken as you get older, so ingrown nails are more common in older people.  Diabetes.  Cutting your toenails incorrectly.  Blood circulation problems.  What are the signs or symptoms? Symptoms may include:  Pain, soreness, or tenderness.  Redness.  Swelling.  Hardening of the skin surrounding the toe.  Your ingrown toenail may be infected if there is fluid, pus, or drainage. How is this diagnosed? An ingrown toenail may be diagnosed by medical history and physical exam. If your toenail is infected, your health care provider may test a sample of the drainage. How is  this treated? Treatment depends on the severity of your ingrown toenail. Some ingrown toenails may be treated at home. More severe or infected ingrown toenails may require surgery to remove all or part of the nail. Infected ingrown toenails may also be treated with antibiotic medicines. Follow these instructions at home:  If you were prescribed an antibiotic medicine, finish all of it even if you start to feel better.  Soak your foot in warm soapy water for 20 minutes, 3 times per day or as directed by your health care provider.  Carefully lift the edge of the nail away from the sore skin by wedging a small piece of cotton under the corner of the nail. This may help with the pain. Be careful not to cause more injury to the area.  Wear shoes that fit well. If your ingrown toenail is causing you pain, try wearing sandals, if possible.  Trim your toenails regularly and carefully. Do not cut them in a curved shape. Cut your toenails straight across. This prevents injury to the skin at the corners of the toenail.  Keep your feet clean and dry.  If you are having trouble walking and are given crutches by your health care provider, use them as directed.  Do not pick at your toenail or try to remove it yourself.  Take medicines only as directed by your health care provider.  Keep all follow-up visits as directed by your health  care provider. This is important. Contact a health care provider if:  Your symptoms do not improve with treatment. Get help right away if:  You have red streaks that start at your foot and go up your leg.  You have a fever.  You have increased redness, swelling, or pain.  You have fluid, blood, or pus coming from your toenail. This information is not intended to replace advice given to you by your health care provider. Make sure you discuss any questions you have with your health care provider. Document Released: 07/14/2000 Document Revised: 12/17/2015 Document  Reviewed: 06/10/2014 Elsevier Interactive Patient Education  [employer].

## 2017-11-28 ENCOUNTER — Other Ambulatory Visit: Payer: Self-pay | Admitting: Physician Assistant

## 2017-11-28 LAB — LIPID PANEL
Cholesterol: 192 mg/dL (ref <200)
HDL: 67 mg/dL (ref >50)
LDL Cholesterol (Calc): 106 mg/dL (calc) — ABNORMAL HIGH
Non-HDL Cholesterol (Calc): 125 mg/dL (calc) (ref <130)
Total CHOL/HDL Ratio: 2.9 (calc) (ref <5.0)
Triglycerides: 99 mg/dL (ref <150)

## 2017-11-28 LAB — HOMOCYSTEINE: HOMOCYSTEINE: 12 umol/L — AB (ref <10.4)

## 2017-11-28 LAB — COMPLETE METABOLIC PANEL WITH GFR
AG RATIO: 2.1 (calc) (ref 1.0–2.5)
ALT: 14 U/L (ref 6–29)
AST: 28 U/L (ref 10–35)
Albumin: 4.9 g/dL (ref 3.6–5.1)
Alkaline phosphatase (APISO): 33 U/L (ref 33–130)
BILIRUBIN TOTAL: 0.6 mg/dL (ref 0.2–1.2)
BUN/Creatinine Ratio: 11 (calc) (ref 6–22)
BUN: 13 mg/dL (ref 7–25)
CHLORIDE: 101 mmol/L (ref 98–110)
CO2: 31 mmol/L (ref 20–32)
Calcium: 10.5 mg/dL — ABNORMAL HIGH (ref 8.6–10.4)
Creat: 1.15 mg/dL — ABNORMAL HIGH (ref 0.50–0.99)
GFR, EST AFRICAN AMERICAN: 59 mL/min/{1.73_m2} — AB (ref >=60)
GFR, Est Non African American: 51 mL/min/{1.73_m2} — ABNORMAL LOW (ref >=60)
Globulin: 2.3 g/dL (calc) (ref 1.9–3.7)
Glucose, Bld: 96 mg/dL (ref 65–99)
POTASSIUM: 4 mmol/L (ref 3.5–5.3)
SODIUM: 143 mmol/L (ref 135–146)
TOTAL PROTEIN: 7.2 g/dL (ref 6.1–8.1)

## 2017-11-28 LAB — VITAMIN B12: VITAMIN B 12: 1298 pg/mL — AB (ref 200–1100)

## 2017-11-28 LAB — B. BURGDORFI ANTIBODIES: B burgdorferi Ab IgG+IgM: <0.9 index

## 2017-11-29 ENCOUNTER — Encounter: Payer: Self-pay | Admitting: Physician Assistant

## 2017-11-29 ENCOUNTER — Encounter: Payer: Self-pay | Admitting: Internal Medicine

## 2017-11-29 LAB — ROCKY MTN SPOTTED FVR ABS PNL(IGG+IGM)
RMSF IGG: NOT DETECTED
RMSF IGM: NOT DETECTED

## 2017-11-30 ENCOUNTER — Telehealth: Payer: Self-pay | Admitting: Internal Medicine

## 2017-11-30 NOTE — Telephone Encounter (Signed)
Patient stated dr had put in for her to have a bone density done. She has some questions about this that she would like someone to call and discuss with her,.  Please advise  [phone]

## 2017-12-02 ENCOUNTER — Other Ambulatory Visit: Payer: BLUE CROSS/BLUE SHIELD

## 2017-12-03 DIAGNOSIS — K219 Gastro-esophageal reflux disease without esophagitis: Secondary | ICD-10-CM | POA: Diagnosis not present

## 2017-12-03 DIAGNOSIS — M47812 Spondylosis without myelopathy or radiculopathy, cervical region: Secondary | ICD-10-CM | POA: Diagnosis not present

## 2017-12-03 DIAGNOSIS — M5136 Other intervertebral disc degeneration, lumbar region: Secondary | ICD-10-CM | POA: Diagnosis not present

## 2017-12-03 DIAGNOSIS — M4712 Other spondylosis with myelopathy, cervical region: Secondary | ICD-10-CM | POA: Diagnosis not present

## 2017-12-03 DIAGNOSIS — Z79899 Other long term (current) drug therapy: Secondary | ICD-10-CM | POA: Diagnosis not present

## 2017-12-03 DIAGNOSIS — M5030 Other cervical disc degeneration, unspecified cervical region: Secondary | ICD-10-CM | POA: Diagnosis not present

## 2017-12-03 DIAGNOSIS — E559 Vitamin D deficiency, unspecified: Secondary | ICD-10-CM | POA: Diagnosis not present

## 2017-12-03 DIAGNOSIS — R030 Elevated blood-pressure reading, without diagnosis of hypertension: Secondary | ICD-10-CM | POA: Diagnosis not present

## 2017-12-03 DIAGNOSIS — M542 Cervicalgia: Secondary | ICD-10-CM | POA: Diagnosis not present

## 2017-12-03 DIAGNOSIS — M4716 Other spondylosis with myelopathy, lumbar region: Secondary | ICD-10-CM | POA: Diagnosis not present

## 2017-12-04 NOTE — Telephone Encounter (Signed)
Called pt. No answer °

## 2017-12-06 ENCOUNTER — Other Ambulatory Visit: Payer: BLUE CROSS/BLUE SHIELD

## 2017-12-18 NOTE — Progress Notes (Signed)
Assessment and Plan:  Isabel [name] was seen today for toe injury.  Diagnoses and all orders for this visit:  Bunion of great toe of right foot -     Ambulatory referral to Podiatry  Contusion of left great toe without damage to nail, initial encounter No injury to nail; tender hangnail present; continue with epsom salt soaks prior to attempting to trim, will refer to podiatry for multiple foot concerns and can have trimmed if still not resolved.  -     Ambulatory referral to Podiatry  Chronic heel pain, left Heel spur vs fat pad atrophy; she is wearing very poorly cushioned shoes Discussed avoiding hard soled shoes, do stretching, get cushioned insoles if needed Can discuss injections at podiatry if needed -     Ambulatory referral to Podiatry  Further disposition pending results of labs. Discussed med's effects and SE's.   Over 15 minutes of exam, counseling, chart review, and critical decision making was performed.   Future Appointments  Date Time Provider [place]  12/27/2017  3:30 PM Unk Pinto, MD [facility] None  04/16/2018 11:00 AM Kandis Nab, PsyD [facility] None  04/23/2018 10:45 AM LBN- NEUROPSYCH TECH [facility] None  05/23/2018  3:00 PM Kandis Nab, PsyD [facility] None  06/04/2018  3:00 PM Vicie Mutters, PA-C [facility] None  11/25/2018  2:00 PM Philemon Kingdom, MD [facility] None    ------------------------------------------------------------------------------------------------------------------   HPI BP (!) 142/92   Pulse 76   Temp (!) 97.3 F (36.3 C)   Ht 5' 1.2" (1.554 m)   Wt 98 lb (44.5 kg)   SpO2 99%   BMI 18.40 kg/m   62 y.o.female presents for evaluation of left Great toe pain; she reports pain began when stepped on by her dog ~3 weeks ago, and has been intermittently tender since then. She presents with nail appearing intact, with thick hangnail to lateral border. She reports she has been going epsom salt soaks without  much improvement, feels the area is too tender to attempt to trim when discussed today. Not injected, no discharge, nail not discolored or cracked, no subungual hematoma.   She also demonstrates right great toe bunion which she reports is increasingly bothersome, thick calloses and reports intermittent left heel pad pain after walking.   Past Medical History:  Diagnosis Date  . Carpal tunnel syndrome on right   . History of concussion    1972, 1977, 1983  . Hyperlipidemia   . Hypertension   . Migraines    pain clinic [place]  . Osteoporosis      Allergies  Allergen Reactions  . Aleve [Naproxen Sodium] Anaphylaxis and Other (See Comments)    GI Upset GI Upset GI Upset   . Lipitor [Atorvastatin] Anaphylaxis and Other (See Comments)  . Niacin And Related Anaphylaxis  . Omnaris [Ciclesonide] Anaphylaxis    migraine migraine  . Ceftin [Cefuroxime Axetil] Rash    Other Reaction: Rash and gi upset Other Reaction: Rash and gi upset  . Other Other (See Comments)  . Levaquin [Levofloxacin In D5w]     Muscle cramps  . Simvastatin   . Zetia [Ezetimibe]     Current Outpatient Medications on File Prior to Visit  Medication Sig  . amLODipine (NORVASC) 5 MG tablet TAKE 1 TABLET BY MOUTH ONCE DAILY  . Cholecalciferol (VITAMIN D) 2000 units CAPS Take 6,000 capsules by mouth daily.  . Cyanocobalamin (B12 LIQUID HEALTH BOOSTER PO) Take by mouth. 1/2 -3/4 eye dropper daily  . diazepam (VALIUM) 10 MG tablet  Take 5 mg by mouth at bedtime as needed for anxiety.   Marland Kitchen levothyroxine (SYNTHROID, LEVOTHROID) 25 MCG tablet Take 12.5 mcg by mouth. 5 days a week  . magnesium gluconate (MAGONATE) 500 MG tablet Take 500 mg by mouth daily.   . Melatonin 1 MG TABS Take 1 tablet by mouth at bedtime.  . Omega-3 Fatty Acids (FISH OIL) 1200 MG CAPS Take 1 capsule by mouth daily.  Marland Kitchen OVER THE COUNTER MEDICATION Vitamin B 6 1 daily  . ranitidine (ZANTAC) 300 MG tablet TAKE 1 TABLET(300 MG) BY MOUTH TWICE DAILY  .  Riboflavin 100 MG CAPS Take 200 mg by mouth daily.  . rizatriptan (MAXALT) 10 MG tablet Take 10 mg by mouth as needed for migraine. May repeat in 2 hours if needed  . rosuvastatin (CRESTOR) 10 MG tablet TAKE 1 TABLET BY MOUTH ONCE DAILY  . TURMERIC PO Take 1 capsule by mouth daily.  Marland Kitchen OVER THE COUNTER MEDICATION Takes folic acid 1 tab daily.   No current facility-administered medications on file prior to visit.     ROS: all negative except above.   Physical Exam:  BP (!) 142/92   Pulse 76   Temp (!) 97.3 F (36.3 C)   Ht 5' 1.2" (1.554 m)   Wt 98 lb (44.5 kg)   SpO2 99%   BMI 18.40 kg/m   General Appearance: Well nourished, in no apparent distress. Neck: Supple.  Respiratory: Respiratory effort normal, BS equal bilaterally without rales, rhonchi, wheezing or stridor.  Cardio: RRR with no MRGs. Brisk peripheral pulses without edema.  Abdomen: Soft, + BS.   Lymphatics: Non tender without lymphadenopathy.  Musculoskeletal: Full ROM, 5/5 strength, normal gait. Left heel tenderness without palpable bony abnormality. Right great toe bunion, very thick calluses.  Skin: Warm, dry without rashes, lesions, ecchymosis. Left great toe nail appearing intact, with thick hangnail to lateral border. Area is too tender to attempt to trim when discussed today. Not injected, no discharge, nail not discolored or cracked, no subungual hematoma.  Neuro: Sensation intact.  Psych: Awake and oriented X 3, normal affect, Insight and Judgment appropriate.     Izora Ribas, NP 11:53 AM [name]

## 2017-12-19 ENCOUNTER — Encounter: Payer: Self-pay | Admitting: Adult Health

## 2017-12-19 ENCOUNTER — Ambulatory Visit: Payer: BLUE CROSS/BLUE SHIELD | Admitting: Adult Health

## 2017-12-19 VITALS — BP 142/92 | HR 76 | Temp 97.3°F | Ht 61.2 in | Wt 98.0 lb

## 2017-12-19 DIAGNOSIS — M79672 Pain in left foot: Secondary | ICD-10-CM | POA: Diagnosis not present

## 2017-12-19 DIAGNOSIS — M21611 Bunion of right foot: Secondary | ICD-10-CM | POA: Diagnosis not present

## 2017-12-19 DIAGNOSIS — S90112A Contusion of left great toe without damage to nail, initial encounter: Secondary | ICD-10-CM

## 2017-12-19 DIAGNOSIS — G8929 Other chronic pain: Secondary | ICD-10-CM

## 2017-12-19 NOTE — Patient Instructions (Signed)

## 2017-12-21 ENCOUNTER — Telehealth: Payer: Self-pay | Admitting: Internal Medicine

## 2017-12-21 NOTE — Telephone Encounter (Signed)
Called pt to inform showing Dexa Scan scheduled for 02/01/2018. No answer.

## 2017-12-21 NOTE — Telephone Encounter (Signed)
Patient stated that she need a referral to get a Dexa Scan,  She also stated that is taking calcium, something she bought from the drug store

## 2017-12-25 ENCOUNTER — Telehealth: Payer: Self-pay | Admitting: Internal Medicine

## 2017-12-25 NOTE — Telephone Encounter (Signed)
Patient is having a DEXA scan done She would like to know which office dr Cruzita Lederer has her going to  Please advise

## 2017-12-25 NOTE — Telephone Encounter (Signed)
Per 11/23/17 [facility] order- [facility]. Pt informed of this.

## 2017-12-27 ENCOUNTER — Ambulatory Visit: Payer: BLUE CROSS/BLUE SHIELD | Admitting: Internal Medicine

## 2017-12-27 VITALS — BP 138/86 | HR 80 | Temp 97.8°F | Resp 16 | Ht 61.0 in | Wt 96.8 lb

## 2017-12-27 DIAGNOSIS — Z79899 Other long term (current) drug therapy: Secondary | ICD-10-CM | POA: Diagnosis not present

## 2017-12-27 DIAGNOSIS — G43809 Other migraine, not intractable, without status migrainosus: Secondary | ICD-10-CM | POA: Diagnosis not present

## 2017-12-27 LAB — COMPLETE METABOLIC PANEL WITH GFR
AG RATIO: 2.1 (calc) (ref 1.0–2.5)
ALT: 14 U/L (ref 6–29)
AST: 27 U/L (ref 10–35)
Albumin: 4.5 g/dL (ref 3.6–5.1)
Alkaline phosphatase (APISO): 29 U/L — ABNORMAL LOW (ref 33–130)
BILIRUBIN TOTAL: 0.5 mg/dL (ref 0.2–1.2)
BUN/Creatinine Ratio: 10 (calc) (ref 6–22)
BUN: 10 mg/dL (ref 7–25)
CHLORIDE: 103 mmol/L (ref 98–110)
CO2: 29 mmol/L (ref 20–32)
Calcium: 10 mg/dL (ref 8.6–10.4)
Creat: 1.01 mg/dL — ABNORMAL HIGH (ref 0.50–0.99)
GFR, Est African American: 69 mL/min/{1.73_m2} (ref >=60)
GFR, Est Non African American: 59 mL/min/{1.73_m2} — ABNORMAL LOW (ref >=60)
GLUCOSE: 89 mg/dL (ref 65–99)
Globulin: 2.1 g/dL (calc) (ref 1.9–3.7)
POTASSIUM: 4.5 mmol/L (ref 3.5–5.3)
Sodium: 140 mmol/L (ref 135–146)
TOTAL PROTEIN: 6.6 g/dL (ref 6.1–8.1)

## 2017-12-29 ENCOUNTER — Encounter: Payer: Self-pay | Admitting: Internal Medicine

## 2017-12-29 NOTE — Progress Notes (Signed)
  Subjective:    Patient ID: Isabel [name], female    DOB: Dec 22, 1955, 62 y.o.   MRN: [id number]  HPI  This delightful 62 yo [name] presents with her husband to discuss her migraine  treatments which have been managed by Dr Amil Amen. Patient reports she recently has has less difficulty with short term memory recall. She relates Dr Joretta Bachelor switched her Maxalt to g Imitrex for better insurance coverage. She is attempting to obtain coverage for Aimovig monthly sq injection for migraine prophylaxis.    Recent labs did show a sl elevated serum calcium.   Review of Systems  10 point systems review negative except as above.     Objective:   Physical Exam  BP 138/86   Pulse 80   Temp 97.8 F (36.6 C)   Resp 16   Ht 5\' 1"  (1.549 m)   Wt 96 lb 12.8 oz (43.9 kg)   BMI 18.29 kg/m   HEENT - WNL. Neck - supple.  Chest - Clear equal BS. Cor - Nl HS. RRR w/o sig MGR. PP 1(+). No edema. MS- FROM w/o deformities.  Gait Nl. Neuro -  Nl w/o focal abnormalities.    Assessment & Plan:   1. Migraine without status migrainosus, not intractable  2. Serum calcium, elevated  - COMPLETE METABOLIC PANEL WITH GFR  3. Medication management  - COMPLETE METABOLIC PANEL WITH GFR  - discussed meds & SE's w/patient & spouse

## 2018-01-01 ENCOUNTER — Ambulatory Visit: Payer: BLUE CROSS/BLUE SHIELD | Admitting: Podiatry

## 2018-01-01 ENCOUNTER — Encounter: Payer: Self-pay | Admitting: Podiatry

## 2018-01-01 VITALS — BP 140/85 | HR 76 | Ht 61.0 in | Wt 95.0 lb

## 2018-01-01 DIAGNOSIS — L851 Acquired keratosis [keratoderma] palmaris et plantaris: Secondary | ICD-10-CM

## 2018-01-01 DIAGNOSIS — M79671 Pain in right foot: Secondary | ICD-10-CM

## 2018-01-01 DIAGNOSIS — S99922A Unspecified injury of left foot, initial encounter: Secondary | ICD-10-CM

## 2018-01-01 DIAGNOSIS — L600 Ingrowing nail: Secondary | ICD-10-CM | POA: Diagnosis not present

## 2018-01-01 DIAGNOSIS — M79672 Pain in left foot: Secondary | ICD-10-CM
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
## 2018-01-02 NOTE — Patient Instructions (Signed)
Ingrown left great toe from trauma and porokeratosis under right plantar ball debrided. Pain relieved. Return as needed.

## 2018-01-04 ENCOUNTER — Other Ambulatory Visit: Payer: Self-pay | Admitting: Internal Medicine

## 2018-01-16 ENCOUNTER — Other Ambulatory Visit: Payer: Self-pay | Admitting: Internal Medicine

## 2018-01-29 ENCOUNTER — Telehealth: Payer: Self-pay | Admitting: Internal Medicine

## 2018-01-29 NOTE — Telephone Encounter (Signed)
?   Please clarify. I am not sure what she needs.

## 2018-01-29 NOTE — Telephone Encounter (Signed)
error 

## 2018-01-29 NOTE — Telephone Encounter (Signed)
Please advise on below  

## 2018-01-29 NOTE — Telephone Encounter (Signed)
Note in her appointment states cosign required for the order. Did it get sent to you to be cosigned?

## 2018-01-29 NOTE — Telephone Encounter (Signed)
I am not sure how to cosign the order...  Can you please call the center and have them take a verbal (?).

## 2018-01-29 NOTE — Telephone Encounter (Signed)
Spoke with [facility], they stated that they do not see that anything is still needed. It was cosigned on 12/21/17. No further action required.

## 2018-01-29 NOTE — Telephone Encounter (Signed)
Patient stated she has appointment tomorrow at the breast center for them to check her Bone Density and how it is doing she is stating she seen online that they needed something from Dr Cruzita Lederer showing that she needs to be there for this. Is is unsure exactly what they are needing.   Please advise

## 2018-01-29 NOTE — Telephone Encounter (Signed)
Sent email to facility held for over 12 minutes on the phone and was unable to reach anyone. Will try and call back this afternoon if I can.

## 2018-01-30 ENCOUNTER — Other Ambulatory Visit: Payer: Self-pay | Admitting: Gastroenterology

## 2018-01-30 DIAGNOSIS — M545 Low back pain: Secondary | ICD-10-CM | POA: Diagnosis not present

## 2018-01-30 DIAGNOSIS — M47816 Spondylosis without myelopathy or radiculopathy, lumbar region: Secondary | ICD-10-CM | POA: Diagnosis not present

## 2018-01-30 DIAGNOSIS — M47812 Spondylosis without myelopathy or radiculopathy, cervical region: Secondary | ICD-10-CM | POA: Diagnosis not present

## 2018-01-30 DIAGNOSIS — M47814 Spondylosis without myelopathy or radiculopathy, thoracic region: Secondary | ICD-10-CM | POA: Diagnosis not present

## 2018-02-01 ENCOUNTER — Other Ambulatory Visit: Payer: BLUE CROSS/BLUE SHIELD

## 2018-02-02 ENCOUNTER — Other Ambulatory Visit: Payer: Self-pay | Admitting: Adult Health

## 2018-02-04 ENCOUNTER — Ambulatory Visit (INDEPENDENT_AMBULATORY_CARE_PROVIDER_SITE_OTHER)
Admission: RE | Admit: 2018-02-04 | Discharge: 2018-02-04 | Disposition: A | Discharge status: Home / Self Care | Payer: BLUE CROSS/BLUE SHIELD | Source: Ambulatory Visit | Attending: Internal Medicine | Admitting: Internal Medicine

## 2018-02-04 DIAGNOSIS — M810 Age-related osteoporosis without current pathological fracture: Secondary | ICD-10-CM | POA: Diagnosis not present

## 2018-02-05 DIAGNOSIS — M810 Age-related osteoporosis without current pathological fracture: Secondary | ICD-10-CM | POA: Diagnosis not present

## 2018-02-06 ENCOUNTER — Telehealth: Payer: Self-pay | Admitting: Internal Medicine

## 2018-02-06 NOTE — Telephone Encounter (Signed)
Patient would like Propecia (whatever Dr. Cruzita Lederer suggested in her e-mail message to patient) sent to [facility] on [address] in [place]

## 2018-02-06 NOTE — Telephone Encounter (Signed)
I spoke to pt and stated that she may have been speaking about prolia, I informed the pt to look it up and to call us bacif it was. Because the medication she said she looked up caused deterioration of the jaw and that she wanted and alternative.

## 2018-02-06 NOTE — Telephone Encounter (Signed)
We discussed about this at last visit, but Prolia is less likely than Bisphosphonates (Reclast, Fosamax) to results in ONJ (Osteonecrosis of the jaw). The options are: - Tymlos or Forteo - injected daily under skin x 2 years - Evenity - injected monthly here in the office  x 1 year - but this has some possible cardiovascular risks We can schedule an appt to discuss if she prefers.

## 2018-02-08 ENCOUNTER — Telehealth: Payer: Self-pay | Admitting: Internal Medicine

## 2018-02-08 NOTE — Telephone Encounter (Signed)
Patient called the [facility] and stated that she did not want to take Prolia because she Googled and makes jaw deterioration and she said her jaw still hurts  Please advise

## 2018-02-08 NOTE — Telephone Encounter (Signed)
This has been handled via previous phone calls in pts chart and has an ongoing conversation with the pt. She is doing some research and will call us back

## 2018-02-08 NOTE — Telephone Encounter (Signed)
Pt wrote down the medications and would like to research them herself and will call us back

## 2018-02-15 ENCOUNTER — Encounter: Payer: Self-pay | Admitting: Internal Medicine

## 2018-02-15 ENCOUNTER — Ambulatory Visit: Payer: BLUE CROSS/BLUE SHIELD | Admitting: Internal Medicine

## 2018-02-15 VITALS — BP 120/84 | HR 76 | Ht 61.0 in | Wt 95.4 lb

## 2018-02-15 DIAGNOSIS — M810 Age-related osteoporosis without current pathological fracture: Secondary | ICD-10-CM

## 2018-02-15 DIAGNOSIS — E559 Vitamin D deficiency, unspecified: Secondary | ICD-10-CM | POA: Diagnosis not present

## 2018-02-15 NOTE — Patient Instructions (Addendum)
Please let me know if you want to proceed with Tymlos or Forteo.  Please come back for a follow-up appointment in 10/2018.  Teriparatide (Forteo) injection What is this medicine? TERIPARATIDE (terr ih PAR a tyd) increases bone mass and strength. It helps make healthy bone and to slow bone loss. This medicine is used to prevent bone fractures. This medicine may be used for other purposes; ask your health care provider or pharmacist if you have questions. COMMON BRAND NAME(S): Forteo What should I tell my health care provider before I take this medicine? They need to know if you have any of these conditions: -bone disease other than osteoporosis -high levels of calcium in the blood -history of cancer in the bone -kidney stone -Paget's disease -parathyroid disease -receiving radiation therapy -an unusual or allergic reaction to teriparatide, other medicines, foods, dyes, or preservatives -pregnant or trying to get pregnant -breast-feeding How should I use this medicine? This medicine is for injection under the skin. You will be taught how to prepare and give this medicine. Use exactly as directed. Take your medicine at regular intervals. Do not take your medicine more often than directed. It is important that you put your used needles and pens in a special sharps container. Do not put them in a trash can. If you do not have a sharps container, call your pharmacist or health care provider to get one. A special MedGuide will be given to you by the pharmacist with each prescription and refill. Be sure to read this information carefully each time. Talk to your pediatrician regarding the use of this medicine in children. Special care may be needed. Overdosage: If you think you have taken too much of this medicine contact a poison control center or emergency room at once. NOTE: This medicine is only for you. Do not share this medicine with others. What if I miss a dose? If you miss a dose, take  it as soon as you can. If it is almost time for your next dose, take only that dose. Do not take double or extra doses. What may interact with this medicine? -digoxin This list may not describe all possible interactions. Give your health care provider a list of all the medicines, herbs, non-prescription drugs, or dietary supplements you use. Also tell them if you smoke, drink alcohol, or use illegal drugs. Some items may interact with your medicine. What should I watch for while using this medicine? Visit your doctor or health care professional for regular checks on your progress. Your doctor may order blood tests and other tests to see how you are doing. You should make sure you get enough calcium and vitamin D while you are taking this medicine, unless your doctor tells you not to. Discuss the foods you eat and the vitamins you take with your health care professional. Dennis Bast may get drowsy or dizzy. Do not drive, use machinery, or do anything that needs mental alertness until you know how this medicine affects you. Do not stand or sit up quickly, especially if you are an older patient. This reduces the risk of dizzy or fainting spells. Talk to your doctor about your risk of cancer. You may be more at risk for certain types of cancers if you take this medicine. What side effects may I notice from receiving this medicine? Side effects that you should report to your doctor or health care professional as soon as possible: -allergic reactions like skin rash, itching or hives, swelling of the face, lips,  or tongue -blood in the urine; pain in the lower back or side; pain when urinating -signs and symptoms of low blood pressure like dizziness; feeling faint or lightheaded, falls; unusually weak or tired -signs and symptoms of increased calcium like nausea; vomiting; constipation; low energy; or muscle weakness Side effects that usually do not require medical attention (report these to your doctor or health  care professional if they continue or are bothersome): -headache -joint pain -nausea -pain, redness, irritation or swelling at the injection site -stomach upset This list may not describe all possible side effects. Call your doctor for medical advice about side effects. You may report side effects to FDA at 1-800-FDA-1088. Where should I keep my medicine? Keep out of the reach of children. Store the pens in a refrigerator between 2 and 8 degrees C (36 and 46 degrees F). Do not freeze. Use the pen quickly after taking out of the refrigerator and recap and return to refrigerator right after using. Protect from light. Throw away any unused medicine 28 days after the first injection from the pen. Throw away any unused medicine after the expiration date on the label. NOTE: This sheet is a summary. It may not cover all possible information. If you have questions about this medicine, talk to your doctor, pharmacist, or health care provider.  2018 Elsevier/Gold Standard (2015-12-06 10:23:57)

## 2018-02-15 NOTE — Progress Notes (Signed)
Patient ID: Isabel [name], female   DOB: 05-May-1956, 62 y.o.   MRN: [id number]    HPI  Isabel [name] is a 62 y.o.-year-old female, returning for follow-up for osteoporosis.  Last visit 3 mo ago.   She scheduled this appt to discuss about options for tx of her OP, after a DXA scan showed a decrease in T scores.  Pt was dx with OP in ~2013.  Reviewed most recent DXA scans: Date L1-L4 T score FN T score  02/04/2018 ([facility]) -3.2 RFN: -2.0 LFN: -2.4  10/15/2015 ([facility])  -3.0  RFN: -1.8 LFN: -2.1   10/07/2013 [facility])  -3.1 (-5.6%*)  RFN: -1.9 LFN: -2.2   07/10/2011 [facility])  -2.8  RFN: n/a LFN: n/a   + h/o one fracture as a child: - R arm - in 57 (44 y/o)   She fell from the kitchen counter in 2000.  No fracture then.  No dizziness, vertigo, orthostasis, poor vision.  (Treatments reviewed: - Alendronate: for many years >> stopped because of possible ONJ.  Pain was migratory - ? Nerve pain..  Reviewed the reports  of her 3-D reconstructed images of her mouth from 09/06/2016: "The region of interest demonstrates radiographic signs that may be suggestive of osteonecrosis/osteomyelitis. If the patient has a history of taking bisphosphonate drugs, the region of interest would correlate with osteonecrosis. Otherwise, the region of interest may be osteomyelitis. Clinical correlation is recommended".  A year ago, we check an N-telopeptide, and this was not suppressed (ideally <10), but also not >18: Component     Latest Ref Rng & Units 11/23/2016  N-Telopeptide     6.2 - 19.0 nM BCE 15.4   She has a history of vitamin D deficiency, but latest levels have been normal: Lab Results  Component Value Date   VD25OH 67 11/08/2016   VD25OH 56 05/03/2016   VD25OH 65 05/04/2015   VD25OH 60 12/17/2014   VD25OH 49 09/02/2014   VD25OH 63 04/23/2014   VD25OH 82 09/08/2013   Patient is on: - Calcium 500 mg daily  - Vitamin D 6000 units daily  No  weightbearing exercises. She was practicing yoga x 9 years >> then stopped b/c pain with movement.  He needs to walk her dogs daily.  No history of high vitamin A intake.  It is unclear when she started menopause as she was taking hormonal tx for severe HAs (from severe cervical OA). She was also on Methadone, tried Botox. She lately had a nerve burn - [facility].  Her mother has a history of a dowager hump.  No h/o hyper/hypocalcemia or hyperparathyroidism.  No h/o kidney stones: Lab Results  Component Value Date   CALCIUM 10.0 12/27/2017   CALCIUM 10.5 (H) 11/27/2017   CALCIUM 10.1 05/22/2017   CALCIUM 10.0 02/26/2017   CALCIUM 10.1 11/08/2016   CALCIUM 10.0 05/03/2016   CALCIUM 9.8 11/03/2015   CALCIUM 10.2 05/04/2015   CALCIUM 10.0 12/17/2014   CALCIUM 10.1 09/02/2014   No h/o thyrotoxicosis. Reviewed recent TSH levels: Lab Results  Component Value Date   TSH 1.37 11/27/2017   TSH 1.31 05/22/2017   TSH 1.65 02/26/2017   TSH 1.62 11/08/2016   TSH 1.44 05/03/2016   No h/o CKD. Last BUN/Cr: Lab Results  Component Value Date   BUN 10 12/27/2017   CREATININE 1.01 (H) 12/27/2017   She has extensive FH of Alzheimer on her father's side.  ROS: Constitutional: no weight gain/no weight loss, no fatigue, no subjective hyperthermia,  no subjective hypothermia Eyes: no blurry vision, no xerophthalmia ENT: no sore throat, no nodules palpated in throat, no dysphagia, no odynophagia, no hoarseness Cardiovascular: no CP/no SOB/no palpitations/no leg swelling Respiratory: no cough/no SOB/no wheezing Gastrointestinal: no N/no V/no D/no C/no acid reflux Musculoskeletal: no muscle aches/no joint aches Skin: no rashes, no hair loss Neurological: no tremors/no numbness/no tingling/no dizziness  I reviewed pt's medications, allergies, PMH, social hx, family hx, and changes were documented in the history of present illness. Otherwise, unchanged from my initial visit note.  Past Medical  History:  Diagnosis Date  . Carpal tunnel syndrome on right   . History of concussion    1972, 1977, 1983  . Hyperlipidemia   . Hypertension   . Migraines    pain clinic [place]  . Osteoporosis    Past Surgical History:  Procedure Laterality Date  . 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 fractured right wrist    . LEEP     early 20's  . MOUTH SURGERY    . WRIST SURGERY Right 1973   Social History   Social History  . Marital status: Married    Spouse name: N/A  . Number of children: 0   Occupational History  . Home maker   Social History Main Topics  . Smoking status: Former Smoker    Quit date: 06/07/1994  . Smokeless tobacco: Never Used  . Alcohol use Yes     Comment: rare  . Drug use: No   Current Outpatient Medications on File Prior to Visit  Medication Sig Dispense Refill  . amLODipine (NORVASC) 5 MG tablet TAKE 1 TABLET BY MOUTH EVERY DAY 90 tablet 0  . Cholecalciferol (VITAMIN D) 2000 units CAPS Take 6,000 capsules by mouth daily.    . Cyanocobalamin (B12 LIQUID HEALTH BOOSTER PO) Take by mouth. 1/2 -3/4 eye dropper daily    . diazepam (VALIUM) 10 MG tablet Take 5 mg by mouth at bedtime as needed for anxiety.     Marland Kitchen levothyroxine (SYNTHROID, LEVOTHROID) 25 MCG tablet Take 12.5 mcg by mouth. 5 days a week    . magnesium gluconate (MAGONATE) 500 MG tablet Take 500 mg by mouth daily.     . Melatonin 1 MG TABS Take 1 tablet by mouth at bedtime.    . Omega-3 Fatty Acids (FISH OIL) 1200 MG CAPS Take 1 capsule by mouth daily.    Marland Kitchen OVER THE COUNTER MEDICATION Takes folic acid 1 tab daily.    Marland Kitchen OVER THE COUNTER MEDICATION Vitamin B 6 1 daily    . ranitidine (ZANTAC) 300 MG tablet TAKE 1 TABLET(300 MG) BY MOUTH TWICE DAILY 60 tablet 1  . Riboflavin 100 MG CAPS Take 200 mg by mouth daily.    . rizatriptan (MAXALT) 10 MG tablet Take 10 mg by mouth as needed for migraine. May repeat in 2 hours if needed    . rosuvastatin (CRESTOR) 10 MG tablet  TAKE 1 TABLET BY MOUTH EVERY DAY 90 tablet 1  . SUMAtriptan (IMITREX) 100 MG tablet Take 100 mg by mouth every 2 (two) hours as needed for migraine. May repeat in 2 hours if headache persists or recurs.    . TURMERIC PO Take 1 capsule by mouth daily.     No current facility-administered medications on file prior to visit.    Allergies  Allergen Reactions  . Aleve [Naproxen Sodium] Anaphylaxis and Other (See Comments)    GI Upset GI Upset  GI Upset   . Lipitor [Atorvastatin] Anaphylaxis and Other (See Comments)  . Niacin And Related Anaphylaxis  . Omnaris [Ciclesonide] Anaphylaxis    migraine migraine  . Ceftin [Cefuroxime Axetil] Rash    Other Reaction: Rash and gi upset Other Reaction: Rash and gi upset  . Other Other (See Comments)  . Levaquin [Levofloxacin In D5w]     Muscle cramps  . Simvastatin   . Zetia [Ezetimibe]    Family History  Problem Relation Age of Onset  . Depression Mother   . Migraines Mother   . Stroke Mother   . Alzheimer's disease Father   . Hyperlipidemia Father   . Hypertension Father   . Colon cancer Neg Hx    PE: BP 120/84   Pulse 76   Ht 5\' 1"  (1.549 m)   Wt 95 lb 6.4 oz (43.3 kg)   SpO2 96%   BMI 18.03 kg/m  Wt Readings from Last 3 Encounters:  02/15/18 95 lb 6.4 oz (43.3 kg)  01/01/18 95 lb (43.1 kg)  12/27/17 96 lb 12.8 oz (43.9 kg)   Constitutional: thin, in NAD Eyes: PERRLA, EOMI, no exophthalmos ENT: moist mucous membranes, no thyromegaly, no cervical lymphadenopathy Cardiovascular: RRR, No MRG Respiratory: CTA B Gastrointestinal: abdomen soft, NT, ND, BS+ Musculoskeletal: no deformities, strength intact in all 4 Skin: moist, warm, no rashes Neurological: no tremor with outstretched hands, DTR normal in all 4  Assessment: 1. Osteoporosis  2. Vit D deficiency  Plan: 1. Osteoporosis - likely age-related and postmenopausal.  She also has family history of osteoporosis - We reviewed together her latest bone mineral density  scans and I explained that her T-scores are slightly lower than before.  However, the last measurement was checked on another machine so the scores are not directly comparable.  However, they are low enough in the osteoporotic range to suggest the need for treatment.  She is very reticent to start osteoporosis treatment that has a possible risk of osteonecrosis of the jaw, since she was diagnosed with this in the past after being on Fosamax.  She is here to discuss alternatives.  At last visit, we did discuss about the potential of starting Prolia, which has a slightly low risk of ONJ compared to bisphosphonates, but when I contacted her with the results of her latest DEXA scan, he was reticent to start this, also.  At that time, I suggested either Tymlos or Forteo, or the newer Romosozumab, but she was also reticent to start these. - Before last visit, she finished 1 year of ished 1 year of Osteostrong >> it costed her $89 per month, not covered by her insurance - We also reviewed her previous work-up.  Her NTX was not suppressed, but was not very high either.  The vitamin D level was normal last year and we will repeat one today.  She continues on vitamin D supplementation.  She does not have signs of secondary causes for osteoporosis.  - At this visit, we reviewed the mechanism of action and possible side effects of all of the above osteoporosis medicines.  Since she would absolutely want to avoid bisphosphonates and Prolia and she is scared about using Evenity (Romosozumab) because of the possible cardiovascular effects (her mother had 3 strokes), I feel that the only option that we have is Tymlos or Forteo.  She does not have a history of skeletal radiation or hyperparathyroidism I gave her written information about the medicines to review and will also have her  call the insurance to see if these are covered.  If they are she decides to start, will need to have her back her labs: Calcium, PTH, BMP, vitamin  D, 24-hour urine calcium.  She was then need to come back in 3 months after starting the medication.  I explained that possible side effects are: Headaches, constipation, joint pain.  She is not at the particular high risk of osteosarcoma.  - will see pt back in 9 mo  2. Vit D deficiency - cont continues on 6000 units vitamin D daily -  latest level was normal - We will recheck another level at next lab draw  CC:  Dr. Amil Amen Dr. Lazaro Arms  Message from patient after the visit: Darshay, Deupree "Pam"   02/15/18 4:24 PM  I just checked with Sherre Poot. Forteo cost $412 per shot so that's completely impossible. Tymlos costs $156/shot. Impossible too. Do you have suggestions for treatment that is helpful, not dangerous, and reasonably affordable? Thank you.  Message sent back:  Me  to Marijah, Larranaga "Pam"    02/15/18 4:46 PM  Dear Ms. [name],  When you mention "per shot", do you mean per pen? 1 pen lasts 1 month.  Also, we have discount cards for Tymlos >> we can try that.  I would also suggest to call the [employer] rep (they are the company producing Tymlos): Caryl Asp - [phone]. He may be able to help, he just helped me with another patient recently.  Sincerely,  Philemon Kingdom MD    Philemon Kingdom, MD PhD [facility]

## 2018-02-18 ENCOUNTER — Encounter: Payer: Self-pay | Admitting: Internal Medicine

## 2018-02-19 ENCOUNTER — Encounter: Payer: Self-pay | Admitting: Internal Medicine

## 2018-02-19 ENCOUNTER — Telehealth: Payer: Self-pay

## 2018-02-19 ENCOUNTER — Other Ambulatory Visit: Payer: Self-pay | Admitting: Internal Medicine

## 2018-02-19 DIAGNOSIS — M810 Age-related osteoporosis without current pathological fracture: Secondary | ICD-10-CM

## 2018-02-19 DIAGNOSIS — E559 Vitamin D deficiency, unspecified: Secondary | ICD-10-CM

## 2018-02-19 NOTE — Telephone Encounter (Signed)
Isabel [name] from [facility] stopped by and dropped off the co pay cards, and stated they are on the Internet as well. He is sending me a copy of a pharmacy form for [facility] mail order speciality pharmacy to complete for pt. Also left the form for the educator. Pt is aware of all of this and will try to have forms completed today.

## 2018-02-19 NOTE — Telephone Encounter (Signed)
Thank you, Isabel [name]! I will fill it out.

## 2018-02-20 ENCOUNTER — Telehealth: Payer: Self-pay | Admitting: Internal Medicine

## 2018-02-20 NOTE — Telephone Encounter (Signed)
Please call patient at ph# [phone]/cell# [phone] with advice/additional instructions re: Forteo (she was told she could come in and pick it up-subscription card-but has questions on how to use the medication). She is coming in now and is hoping someone will be able to discuss the above with her when she arrives.

## 2018-02-20 NOTE — Telephone Encounter (Signed)
Went over with pt about the Tymlos and she does not need to pick up the discount card since medication was sent to [employer] and they automatically apply the discount.

## 2018-02-26 DIAGNOSIS — M47816 Spondylosis without myelopathy or radiculopathy, lumbar region: Secondary | ICD-10-CM | POA: Diagnosis not present

## 2018-02-26 DIAGNOSIS — I10 Essential (primary) hypertension: Secondary | ICD-10-CM | POA: Diagnosis not present

## 2018-02-26 DIAGNOSIS — K219 Gastro-esophageal reflux disease without esophagitis: Secondary | ICD-10-CM | POA: Diagnosis not present

## 2018-02-26 DIAGNOSIS — Z888 Allergy status to other drugs, medicaments and biological substances status: Secondary | ICD-10-CM | POA: Diagnosis not present

## 2018-02-26 DIAGNOSIS — Z886 Allergy status to analgesic agent status: Secondary | ICD-10-CM | POA: Diagnosis not present

## 2018-02-26 DIAGNOSIS — Z885 Allergy status to narcotic agent status: Secondary | ICD-10-CM | POA: Diagnosis not present

## 2018-02-26 DIAGNOSIS — Z79899 Other long term (current) drug therapy: Secondary | ICD-10-CM | POA: Diagnosis not present

## 2018-02-26 DIAGNOSIS — M4684 Other specified inflammatory spondylopathies, thoracic region: Secondary | ICD-10-CM | POA: Diagnosis not present

## 2018-02-26 DIAGNOSIS — M47812 Spondylosis without myelopathy or radiculopathy, cervical region: Secondary | ICD-10-CM | POA: Diagnosis not present

## 2018-02-26 DIAGNOSIS — M4687 Other specified inflammatory spondylopathies, lumbosacral region: Secondary | ICD-10-CM | POA: Diagnosis not present

## 2018-02-26 DIAGNOSIS — G894 Chronic pain syndrome: Secondary | ICD-10-CM | POA: Diagnosis not present

## 2018-02-26 DIAGNOSIS — Z881 Allergy status to other antibiotic agents status: Secondary | ICD-10-CM | POA: Diagnosis not present

## 2018-02-26 DIAGNOSIS — M47814 Spondylosis without myelopathy or radiculopathy, thoracic region: Secondary | ICD-10-CM | POA: Diagnosis not present

## 2018-02-27 ENCOUNTER — Telehealth: Payer: Self-pay | Admitting: Internal Medicine

## 2018-02-27 NOTE — Telephone Encounter (Signed)
Patient has 2 questions:  1. Dr. Cruzita Lederer arranged for patient to get the drug Tymols with a drug rep. Pt was told by Dr. Cruzita Lederer to inject 1 x per month, but the instructions say there is enough for injecting 1 x per day-pharmacy told her 1 x per day.   2. Drug Rep/[employer] Wants to teach patient how to use the medication/injection. It has 30 doses in it. It needs a new needle every day. They want to send someone to her home to teach her. Patient does not want them to come to her home. Is it possible for patient to be taught at our office?  Patient needs to learn how to use medication. Is patient supposed to use medication every day? Please call patient at ph# [phone] if no answer call ph# [phone] to advise

## 2018-02-28 ENCOUNTER — Encounter: Payer: Self-pay | Admitting: Internal Medicine

## 2018-02-28 NOTE — Telephone Encounter (Signed)
LMTCB

## 2018-02-28 NOTE — Telephone Encounter (Signed)
Patient stated that she was worried about taking Tymlos because she read online that it can cause cancer. I told the pt to read the package insert about side effect, to speak with her pharmacist about side effects, and to speak to the drug rep about the side effects but I would not take everything off of the internet at face value. Pt stated that she did not want the Tymlos educator to come to her home, I told her to call Steffanie Dunn and discuss that with her but they are the ones who do the teachings for this medication and I am sure they can find a way to work this out with her. Gave her Kristis number to reach out to her. Pt asked about Prolia and I read her the 02/15/18 OV note about how she did not want to take Prolia because of possible side effects with cardiovascular system. Went over with pt that is why Tymlos and Forteo was all she was told to look into with her insurance. She stated that she understood but she is still having reservations about taking the medication. Again I advised her to reach out to the people listed above to discuss the side effects and to not continue "googling" them because that is not always reliable. Pt stated that she understood and she would set up an appt with Kristi for the education.

## 2018-03-04 ENCOUNTER — Telehealth: Payer: Self-pay | Admitting: Internal Medicine

## 2018-03-04 NOTE — Telephone Encounter (Signed)
Patient is returning your call.  

## 2018-03-04 NOTE — Telephone Encounter (Signed)
I have not called the pt, a mychart message was sent to Dr Cruzita Lederer to reply [place]

## 2018-03-13 DIAGNOSIS — G894 Chronic pain syndrome: Secondary | ICD-10-CM | POA: Diagnosis not present

## 2018-03-13 DIAGNOSIS — M47814 Spondylosis without myelopathy or radiculopathy, thoracic region: Secondary | ICD-10-CM | POA: Diagnosis not present

## 2018-03-13 DIAGNOSIS — M47816 Spondylosis without myelopathy or radiculopathy, lumbar region: Secondary | ICD-10-CM | POA: Diagnosis not present

## 2018-03-17 ENCOUNTER — Encounter: Payer: Self-pay | Admitting: Internal Medicine

## 2018-03-21 ENCOUNTER — Other Ambulatory Visit (INDEPENDENT_AMBULATORY_CARE_PROVIDER_SITE_OTHER): Payer: BLUE CROSS/BLUE SHIELD

## 2018-03-21 ENCOUNTER — Encounter: Payer: Self-pay | Admitting: Internal Medicine

## 2018-03-21 ENCOUNTER — Ambulatory Visit: Payer: BLUE CROSS/BLUE SHIELD | Admitting: Internal Medicine

## 2018-03-21 VITALS — BP 132/76 | HR 83 | Ht 61.0 in | Wt 95.0 lb

## 2018-03-21 DIAGNOSIS — E559 Vitamin D deficiency, unspecified: Secondary | ICD-10-CM

## 2018-03-21 DIAGNOSIS — M810 Age-related osteoporosis without current pathological fracture: Secondary | ICD-10-CM

## 2018-03-21 NOTE — Patient Instructions (Addendum)
Please stop at the lab

## 2018-03-21 NOTE — Progress Notes (Signed)
Patient ID: Isabel [name], female   DOB: [birth year], 62 y.o.   MRN: [id number]    HPI  Isabel [name] is a 62 y.o.-year-old female, returning for follow-up for osteoporosis.  Last visit 2 months ago.  She is scheduled this appointment to discuss about option treatment for her osteoporosis.  2 months ago we reviewed again her options, but this time she wanted to come with her husband.  Pt was dx with OP in ~2013  Reviewed most recent DXA scan reports: Date L1-L4 T score FN T score  02/04/2018 ([facility]) -3.2 RFN: -2.0 LFN: -2.4  10/15/2015 ([facility])  -3.0  RFN: -1.8 LFN: -2.1   10/07/2013 [facility])  -3.1 (-5.6%*)  RFN: -1.9 LFN: -2.2   07/10/2011 [facility])  -2.8  RFN: n/a LFN: n/a   + h/o one fracture as a child: - R arm - in 57 (56 y/o)   She fell from the kitchen counter in 2000.  No fracture then  No dvertigo, orthostasis, poor vision, but occas. Dizziness.  Treatments reviewed: - Alendronate: for many years >> stopped because of possible ONJ.  Pain was migratory - ? Nerve pain..  Reviewed the reports  of her 3-D reconstructed images of her mouth from 09/06/2016: "The region of interest demonstrates radiographic signs that may be suggestive of osteonecrosis/osteomyelitis. If the patient has a history of taking bisphosphonate drugs, the region of interest would correlate with osteonecrosis. Otherwise, the region of interest may be osteomyelitis. Clinical correlation is recommended".  N-telopeptide was not suppressed (ideally <10), but not higher than 18: Component     Latest Ref Rng & Units 11/23/2016  N-Telopeptide     6.2 - 19.0 nM BCE 15.4   Latest vitamin D level normal: Lab Results  Component Value Date   VD25OH 67 11/08/2016   VD25OH 56 05/03/2016   VD25OH 65 05/04/2015   VD25OH 60 12/17/2014   VD25OH 49 09/02/2014   VD25OH 63 04/23/2014   VD25OH 82 09/08/2013   Patient is on: - Calcium 500 mg daily - Vitamin D 6000 >> 8000 units  daily  No weightbearing exercises. She was practicing yoga x 9 years >> then stopped b/c pain with movement.  He needs to walk her dog daily  No history of high vitamin A intake.  It is unclear when she started menopause as she was taking hormonal tx for severe HAs (from severe cervical OA). She was also on Methadone, tried Botox. She lately had a nerve burn - [facility].  Her mother has a history of a dowager hump.  No history of hyper or hyper hypocalcemia, no hyperparathyroidism.  No history of kidney stones. Lab Results  Component Value Date   CALCIUM 10.0 12/27/2017   CALCIUM 10.5 (H) 11/27/2017   CALCIUM 10.1 05/22/2017   CALCIUM 10.0 02/26/2017   CALCIUM 10.1 11/08/2016   CALCIUM 10.0 05/03/2016   CALCIUM 9.8 11/03/2015   CALCIUM 10.2 05/04/2015   CALCIUM 10.0 12/17/2014   CALCIUM 10.1 09/02/2014   No history of hyper thyroidism.  Reviewed previous TSH levels: Lab Results  Component Value Date   TSH 1.37 11/27/2017   TSH 1.31 05/22/2017   TSH 1.65 02/26/2017   TSH 1.62 11/08/2016   TSH 1.44 05/03/2016   No history of CKD.  Last BUN/creatinine: Lab Results  Component Value Date   BUN 10 12/27/2017   CREATININE 1.01 (H) 12/27/2017   She has extensive FH of Alzheimer on her father's side.  ROS: Constitutional: + weight loss from  Aimovig, no fatigue, no subjective hyperthermia, no subjective hypothermia Eyes: no blurry vision, no xerophthalmia ENT: no sore throat, no nodules palpated in throat, no dysphagia, no odynophagia, no hoarseness Cardiovascular: no CP/no SOB/no palpitations/no leg swelling Respiratory: no cough/no SOB/no wheezing Gastrointestinal: no N/no V/no D/no C/no acid reflux Musculoskeletal: no muscle aches/no joint aches Skin: no rashes, no hair loss Neurological: no tremors/no numbness/no tingling/no dizziness  I reviewed pt's medications, allergies, PMH, social hx, family hx, and changes were documented in the history of present illness. Otherwise,  unchanged from my initial visit note.  Past Medical History:  Diagnosis Date  . Carpal tunnel syndrome on right   . History of concussion    1972, 1977, 1983  . Hyperlipidemia   . Hypertension   . Migraines    pain clinic [place]  . Osteoporosis    Past Surgical History:  Procedure Laterality Date  . 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 fractured right wrist    . LEEP     early 20's  . MOUTH SURGERY    . WRIST SURGERY Right 1973   Social History   Social History  . Marital status: Married    Spouse name: N/A  . Number of children: 0   Occupational History  . Home maker   Social History Main Topics  . Smoking status: Former Smoker    Quit date: 06/07/1994  . Smokeless tobacco: Never Used  . Alcohol use Yes     Comment: rare  . Drug use: No   Current Outpatient Medications on File Prior to Visit  Medication Sig Dispense Refill  . AIMOVIG 140 MG/ML SOAJ   2  . amLODipine (NORVASC) 5 MG tablet TAKE 1 TABLET BY MOUTH EVERY DAY 90 tablet 0  . Cholecalciferol (VITAMIN D) 2000 units CAPS Take 6,000 capsules by mouth daily.    . Cyanocobalamin (B12 LIQUID HEALTH BOOSTER PO) Take by mouth. 1/2 -3/4 eye dropper daily    . diazepam (VALIUM) 10 MG tablet Take 5 mg by mouth at bedtime as needed for anxiety.     Marland Kitchen levothyroxine (SYNTHROID, LEVOTHROID) 25 MCG tablet Take 12.5 mcg by mouth. 5 days a week    . magnesium gluconate (MAGONATE) 500 MG tablet Take 500 mg by mouth daily.     . Melatonin 1 MG TABS Take 1 tablet by mouth at bedtime.    . Omega-3 Fatty Acids (FISH OIL) 1200 MG CAPS Take 1 capsule by mouth daily.    Marland Kitchen OVER THE COUNTER MEDICATION Takes folic acid 1 tab daily.    Marland Kitchen OVER THE COUNTER MEDICATION Vitamin B 6 1 daily    . ranitidine (ZANTAC) 300 MG tablet TAKE 1 TABLET(300 MG) BY MOUTH TWICE DAILY 60 tablet 1  . Riboflavin 100 MG CAPS Take 200 mg by mouth daily.    . rizatriptan (MAXALT) 10 MG tablet Take 10 mg by mouth as needed for  migraine. May repeat in 2 hours if needed    . rosuvastatin (CRESTOR) 10 MG tablet TAKE 1 TABLET BY MOUTH EVERY DAY 90 tablet 1  . SUMAtriptan (IMITREX) 100 MG tablet Take 100 mg by mouth every 2 (two) hours as needed for migraine. May repeat in 2 hours if headache persists or recurs.    . TURMERIC PO Take 1 capsule by mouth daily.    Marland Kitchen zolmitriptan (ZOMIG-ZMT) 2.5 MG disintegrating tablet DIS ONE T PO UTD PRF MIGRAINE  5   No  current facility-administered medications on file prior to visit.    Allergies  Allergen Reactions  . Aleve [Naproxen Sodium] Anaphylaxis and Other (See Comments)    GI Upset GI Upset GI Upset   . Lipitor [Atorvastatin] Anaphylaxis and Other (See Comments)  . Niacin And Related Anaphylaxis  . Omnaris [Ciclesonide] Anaphylaxis    migraine migraine  . Ceftin [Cefuroxime Axetil] Rash    Other Reaction: Rash and gi upset Other Reaction: Rash and gi upset  . Other Other (See Comments)  . Levaquin [Levofloxacin In D5w]     Muscle cramps  . Simvastatin   . Zetia [Ezetimibe]    Family History  Problem Relation Age of Onset  . Depression Mother   . Migraines Mother   . Stroke Mother   . Alzheimer's disease Father   . Hyperlipidemia Father   . Hypertension Father   . Colon cancer Neg Hx    PE: BP 132/76   Pulse 83   Ht 5\' 1"  (1.549 m)   Wt 95 lb (43.1 kg)   SpO2 97%   BMI 17.95 kg/m  Wt Readings from Last 3 Encounters:  03/21/18 95 lb (43.1 kg)  02/15/18 95 lb 6.4 oz (43.3 kg)  01/01/18 95 lb (43.1 kg)   Constitutional: thin, in NAD Eyes: PERRLA, EOMI, no exophthalmos ENT: moist mucous membranes, no thyromegaly, no cervical lymphadenopathy Cardiovascular: RRR, No MRG Respiratory: CTA B Gastrointestinal: abdomen soft, NT, ND, BS+ Musculoskeletal: no deformities, strength intact in all 4 Skin: moist, warm, no rashes Neurological: no tremor with outstretched hands, DTR normal in all 4  Assessment: 1. Osteoporosis - Of note, she finished 1 year  of osteo-strong skeletal loading.  Discussed with her $89 per month and was not covered by insurance  2. Vit D deficiency  Other tx'ing Dr's: Dr. Amil Amen Dr. Lazaro Arms  Plan: 1. Osteoporosis -Likely age related/postmenopausal.  She also has family history of osteoporosis -We again reviewed together her latest bone mineral density scan reports and I explained that her T-scores are slightly lower than before.  However, the last measurement was checked on another machine so the scores are not directly comparable. They are low enough in the osteoporotic range to suggest the need for treatment, though.  She is very reticent to start osteoporosis treatment that has a possible risk of osteonecrosis of the jaw, since she was diagnosed with this in the past after being on Fosamax.  Last visit, 2 months ago was spent in discussing possibilities for treatment.  We talked about the potential of starting Prolia, which has a slightly lower risk of ONJ compared to bisphosphonates, but she would not want to start this.  Therefore, I suggested either Tymlos or Forteo, or the newer Evenity, but she was reticent to start any of these (especially Evenity since this is a high risk of cardio vascular events and her mother has had 3 strokes).  After communicating through my chart following last visit, she accepted to try Tymlos, for which we involved the rep and she did not have a high co-pay.  However, after everything was arranged, she changed her mind since she read that this could cause cancer.  Also, she did not like the idea of the right coming to her house and showing her how to do the injections.  She sent me a message several days ago that she would like to go ahead with Forteo, rather than team loss, since this has been longer on the market and appears to  have less side effects.  At this visit, I let her know that this is now becoming off label so unlikely to be covered by insurance.  She tells me that she  would like to proceed with Prolia and we discussed again about benefits and possible side effects.  She had questions about possible liver and kidney side effects and I reassured her that these are extremely uncommon. - At this visit, we will check a BMP and will send a preauthorization for Prolia after results are back  2. Vit D deficiency -Continues on 8000 units vitamin D daily. -Latest level was normal -We will recheck another level today  Appointment on 03/21/2018  Component Date Value Ref Range Status  . Glucose, Bld 03/21/2018 89  65 - 99 mg/dL Final   Comment: .            Fasting reference interval .   . BUN 03/21/2018 30* 7 - 25 mg/dL Final  . Creat 03/21/2018 1.25* 0.50 - 0.99 mg/dL Final   Comment: For patients >67 years of age, the reference limit for Creatinine is approximately 13% higher for people identified as African-American. .   . GFR, Est Non African American 03/21/2018 46* > OR = 60 mL/min/1.11m2 Final  . GFR, Est African American 03/21/2018 53* > OR = 60 mL/min/1.25m2 Final  . BUN/Creatinine Ratio 03/21/2018 24* 6 - 22 (calc) Final  . Sodium 03/21/2018 140  135 - 146 mmol/L Final  . Potassium 03/21/2018 4.0  3.5 - 5.3 mmol/L Final  . Chloride 03/21/2018 101  98 - 110 mmol/L Final  . CO2 03/21/2018 31  20 - 32 mmol/L Final  . Calcium 03/21/2018 10.2  8.6 - 10.4 mg/dL Final  . VITD 03/21/2018 101.31* 30.00 - 100.00 ng/mL Final   Vitamin D level is too high.  I will advised her to decrease the dose back to 6000 units daily. Kidney function is slightly worse.  I will advise her to stay hydrated.  We will go ahead with Prolia PA, as discussed.  Philemon Kingdom, MD PhD [facility] Endocrinology

## 2018-03-22 ENCOUNTER — Encounter: Payer: Self-pay | Admitting: Internal Medicine

## 2018-03-22 LAB — BASIC METABOLIC PANEL WITH GFR
BUN / CREAT RATIO: 24 (calc) — AB (ref 6–22)
BUN: 30 mg/dL — ABNORMAL HIGH (ref 7–25)
CALCIUM: 10.2 mg/dL (ref 8.6–10.4)
CHLORIDE: 101 mmol/L (ref 98–110)
CO2: 31 mmol/L (ref 20–32)
Creat: 1.25 mg/dL — ABNORMAL HIGH (ref 0.50–0.99)
GFR, EST AFRICAN AMERICAN: 53 mL/min/{1.73_m2} — AB (ref >=60)
GFR, Est Non African American: 46 mL/min/{1.73_m2} — ABNORMAL LOW (ref >=60)
GLUCOSE: 89 mg/dL (ref 65–99)
POTASSIUM: 4 mmol/L (ref 3.5–5.3)
Sodium: 140 mmol/L (ref 135–146)

## 2018-03-22 LAB — VITAMIN D 25 HYDROXY (VIT D DEFICIENCY, FRACTURES): VITD: 101.31 ng/mL — AB (ref 30.00–100.00)

## 2018-04-02 ENCOUNTER — Encounter: Payer: Self-pay | Admitting: Internal Medicine

## 2018-04-05 ENCOUNTER — Other Ambulatory Visit: Payer: Self-pay | Admitting: Internal Medicine

## 2018-04-12 ENCOUNTER — Encounter: Payer: Self-pay | Admitting: Internal Medicine

## 2018-04-16 ENCOUNTER — Encounter: Payer: BLUE CROSS/BLUE SHIELD | Admitting: Psychology

## 2018-04-18 ENCOUNTER — Telehealth: Payer: Self-pay

## 2018-04-18 ENCOUNTER — Telehealth: Payer: Self-pay | Admitting: Internal Medicine

## 2018-04-18 NOTE — Telephone Encounter (Signed)
Patient is following up on the prolia injection that she is needing done  Please advise

## 2018-04-18 NOTE — Telephone Encounter (Signed)
PA approval received and given to [facility]

## 2018-04-19 DIAGNOSIS — M47816 Spondylosis without myelopathy or radiculopathy, lumbar region: Secondary | ICD-10-CM | POA: Diagnosis not present

## 2018-04-19 DIAGNOSIS — M7918 Myalgia, other site: Secondary | ICD-10-CM | POA: Diagnosis not present

## 2018-04-22 DIAGNOSIS — M47816 Spondylosis without myelopathy or radiculopathy, lumbar region: Secondary | ICD-10-CM | POA: Diagnosis not present

## 2018-04-22 DIAGNOSIS — M47814 Spondylosis without myelopathy or radiculopathy, thoracic region: Secondary | ICD-10-CM | POA: Diagnosis not present

## 2018-04-22 DIAGNOSIS — M7918 Myalgia, other site: Secondary | ICD-10-CM | POA: Diagnosis not present

## 2018-04-22 DIAGNOSIS — M545 Low back pain: Secondary | ICD-10-CM | POA: Diagnosis not present

## 2018-04-23 ENCOUNTER — Encounter: Payer: Self-pay | Admitting: Internal Medicine

## 2018-04-24 ENCOUNTER — Telehealth: Payer: Self-pay | Admitting: Internal Medicine

## 2018-04-24 NOTE — Telephone Encounter (Signed)
Pt want to speak to nurse concerning getting her injection for Denosumab, pt stated she spoke to her ins company and they have approved it.

## 2018-04-25 NOTE — Telephone Encounter (Signed)
Routing to Lisa

## 2018-04-29 ENCOUNTER — Encounter: Payer: Self-pay | Admitting: Internal Medicine

## 2018-05-02 ENCOUNTER — Ambulatory Visit (INDEPENDENT_AMBULATORY_CARE_PROVIDER_SITE_OTHER): Payer: BLUE CROSS/BLUE SHIELD

## 2018-05-02 DIAGNOSIS — M810 Age-related osteoporosis without current pathological fracture: Secondary | ICD-10-CM | POA: Diagnosis not present

## 2018-05-02 MED ORDER — DENOSUMAB 60 MG/ML ~~LOC~~ SOSY
60.0000 mg | PREFILLED_SYRINGE | Freq: Once | SUBCUTANEOUS | Status: AC
  Administered 2018-05-02: 60 mg via SUBCUTANEOUS

## 2018-05-02 NOTE — Progress Notes (Signed)
Per orders of Dr. Cruzita Lederer injection of Prolia given today by [facility] CMA . Patient tolerated injection well.

## 2018-05-03 NOTE — Telephone Encounter (Signed)
Done

## 2018-05-03 NOTE — Telephone Encounter (Signed)
Patient received Prolia on 05/02/18 she owed $0 and PA was approved- ref # for PA is [id number] from 04/16/18-04/16/19

## 2018-05-03 NOTE — Telephone Encounter (Signed)
Patient notified and given Prolia on 05/02/18

## 2018-05-07 ENCOUNTER — Other Ambulatory Visit: Payer: Self-pay | Admitting: Physician Assistant

## 2018-05-07 ENCOUNTER — Other Ambulatory Visit: Payer: Self-pay | Admitting: *Deleted

## 2018-05-07 MED ORDER — LEVOTHYROXINE SODIUM 25 MCG PO TABS
25.0000 ug | ORAL_TABLET | Freq: Every day | ORAL | 0 refills | Status: DC
Start: 2018-05-07 — End: 2018-10-31

## 2018-05-08 DIAGNOSIS — G4700 Insomnia, unspecified: Secondary | ICD-10-CM | POA: Diagnosis not present

## 2018-05-08 DIAGNOSIS — G43709 Chronic migraine without aura, not intractable, without status migrainosus: Secondary | ICD-10-CM | POA: Diagnosis not present

## 2018-05-08 DIAGNOSIS — M5136 Other intervertebral disc degeneration, lumbar region: Secondary | ICD-10-CM | POA: Diagnosis not present

## 2018-05-09 ENCOUNTER — Encounter: Payer: Self-pay | Admitting: Internal Medicine

## 2018-05-23 ENCOUNTER — Encounter: Payer: BLUE CROSS/BLUE SHIELD | Admitting: Psychology

## 2018-06-04 ENCOUNTER — Ambulatory Visit: Payer: BLUE CROSS/BLUE SHIELD | Admitting: Adult Health Nurse Practitioner

## 2018-06-04 ENCOUNTER — Encounter: Payer: Self-pay | Admitting: Adult Health Nurse Practitioner

## 2018-06-04 VITALS — BP 137/90 | HR 77 | Temp 97.7°F | Ht 61.0 in | Wt 100.2 lb

## 2018-06-04 DIAGNOSIS — G43809 Other migraine, not intractable, without status migrainosus: Secondary | ICD-10-CM

## 2018-06-04 DIAGNOSIS — Z130 Encounter for screening for diseases of the blood and blood-forming organs and certain disorders involving the immune mechanism: Secondary | ICD-10-CM

## 2018-06-04 DIAGNOSIS — Z79899 Other long term (current) drug therapy: Secondary | ICD-10-CM

## 2018-06-04 DIAGNOSIS — Z131 Encounter for screening for diabetes mellitus: Secondary | ICD-10-CM

## 2018-06-04 DIAGNOSIS — Z13220 Encounter for screening for lipoid disorders: Secondary | ICD-10-CM

## 2018-06-04 DIAGNOSIS — I10 Essential (primary) hypertension: Secondary | ICD-10-CM | POA: Diagnosis not present

## 2018-06-04 DIAGNOSIS — E039 Hypothyroidism, unspecified: Secondary | ICD-10-CM

## 2018-06-04 DIAGNOSIS — E782 Mixed hyperlipidemia: Secondary | ICD-10-CM

## 2018-06-04 DIAGNOSIS — R7303 Prediabetes: Secondary | ICD-10-CM

## 2018-06-04 DIAGNOSIS — Z0000 Encounter for general adult medical examination without abnormal findings: Secondary | ICD-10-CM | POA: Diagnosis not present

## 2018-06-04 DIAGNOSIS — Z136 Encounter for screening for cardiovascular disorders: Secondary | ICD-10-CM | POA: Diagnosis not present

## 2018-06-04 DIAGNOSIS — E538 Deficiency of other specified B group vitamins: Secondary | ICD-10-CM

## 2018-06-04 DIAGNOSIS — Z1389 Encounter for screening for other disorder: Secondary | ICD-10-CM

## 2018-06-04 DIAGNOSIS — M818 Other osteoporosis without current pathological fracture: Secondary | ICD-10-CM

## 2018-06-04 DIAGNOSIS — E559 Vitamin D deficiency, unspecified: Secondary | ICD-10-CM

## 2018-06-04 DIAGNOSIS — M810 Age-related osteoporosis without current pathological fracture: Secondary | ICD-10-CM

## 2018-06-04 DIAGNOSIS — K219 Gastro-esophageal reflux disease without esophagitis: Secondary | ICD-10-CM

## 2018-06-04 DIAGNOSIS — R030 Elevated blood-pressure reading, without diagnosis of hypertension: Secondary | ICD-10-CM

## 2018-06-04 DIAGNOSIS — Z1329 Encounter for screening for other suspected endocrine disorder: Secondary | ICD-10-CM

## 2018-06-04 NOTE — Progress Notes (Signed)
Complete Physical  Assessment and Plan: Isabel [name] was seen today for annual exam.  Diagnoses and all orders for this visit:  Other osteoporosis without current pathological fracture -     VITAMIN D 25 Hydroxy (Vit-D Deficiency, Fractures) -     CBC with Differential/Platelet She is receiving Prolia Q45months and is taking Calcium and Vit D.   Hypothyroidism, unspecified type -     TSH -     CBC with Differential/Platelet -     COMPLETE METABOLIC PANEL WITH GFR -     Hepatic function panel -     Magnesium Takin levothyroxine 45mcg continue medications the same, reminded to take on an empty stomach 30-83mins before food.  Will contact with results  Mixed hyperlipidemia -     Lipid panel Taking Crestor 10mg  decrease fatty foods increase activity.  Continue OTC Fish Oil  Other migraine without status migrainosus, not intractable She is receiving monthly Aimovig injections with good results Has Zolitriptan PRN  Prediabetes -     Hemoglobin A1c  Vitamin D deficiency -     VITAMIN D 25 Hydroxy (Vit-D Deficiency, Fractures) Taking daily supplimentation, continue  Gastroesophageal reflux disease, esophagitis presence not specified -     CBC with Differential/Platelet -     COMPLETE METABOLIC PANEL WITH GFR Doing well Contniue Zantac PRN  Elevated BP without diagnosis of hypertension - continue medications, DASH diet, exercise and monitor at home. Call if greater than 130/80.  Patient to monitor at home and contact office if elevation is consistent . Medication management -     Iron,Total/Total Iron Binding Cap -     Vitamin B12 -     TSH -     VITAMIN D 25 Hydroxy (Vit-D Deficiency, Fractures) -     COMPLETE METABOLIC PANEL WITH GFR -     Urinalysis w microscopic + reflex cultur  Other orders -     REFLEXIVE URINE CULTURE -     Urine Culture    Discussed med's effects and SE's. Screening labs and tests as requested with regular follow-up as recommended. Over 40  minutes of exam, counseling, chart review, and complex, high level critical decision making was performed this visit.   HPI  62 y.o. female  presents for a complete physical and follow up for has Recurrent acute sinusitis; Hyperlipidemia; GERD (gastroesophageal reflux disease); Cervical spondylosis without myelopathy; Headache, migraine; Elevated BP without diagnosis of hypertension; Vitamin D deficiency; Medication management; Age-related osteoporosis without current pathological fracture; Hypothyroidism; and Osteoporosis on their problem list..  Her blood pressure has been controlled at home, today their BP is BP: 137/90 She does workout in the form of walking. She denies chest pain, shortness of breath, dizziness.   She is on cholesterol medication and denies myalgias. Her cholesterol is not at goal. The cholesterol last visit was:   Lab Results  Component Value Date   CHOL 221 (H) 06/04/2018   HDL 72 06/04/2018   LDLCALC 120 (H) 06/04/2018   TRIG 175 (H) 06/04/2018   CHOLHDL 3.1 06/04/2018    She has been working on diet and exercise for prediabetes, she is not on bASA, she is not on ACE/ARB and denies nausea, polydipsia and polyuria. Last A1C in the office was:  Lab Results  Component Value Date   HGBA1C 5.5 06/04/2018    Last GFR: Lab Results  Component Value Date   GFRNONAA 67 06/04/2018   Lab Results  Component Value Date   GFRAA 78 06/04/2018  Patient is on Vitamin D supplement.   Lab Results  Component Value Date   VD25OH 73 06/04/2018      Current Medications:  Current Outpatient Medications on File Prior to Visit  Medication Sig Dispense Refill  . AIMOVIG 140 MG/ML SOAJ   2  . amLODipine (NORVASC) 5 MG tablet TAKE 1 TABLET BY MOUTH EVERY DAY 90 tablet 1  . Calcium Carb-Cholecalciferol (CALCIUM 600+D3 PO) Take by mouth.    . Cholecalciferol (VITAMIN D) 2000 units CAPS Take 6,000 capsules by mouth daily.    . Cyanocobalamin (B12 LIQUID HEALTH BOOSTER PO)  Take by mouth. 1/2 -3/4 eye dropper daily    . diazepam (VALIUM) 10 MG tablet Take 5 mg by mouth at bedtime as needed for anxiety.     . Ginkgo Biloba 40 MG TABS Take by mouth.    . Guggulipid-Black Pepper (GUGLIPID/BIOPERINE PO) Take by mouth.    . levothyroxine (SYNTHROID) 25 MCG tablet Take 1 tablet (25 mcg total) by mouth daily. 90 tablet 0  . magnesium gluconate (MAGONATE) 500 MG tablet Take 500 mg by mouth daily.     . Melatonin 1 MG TABS Take 1 tablet by mouth at bedtime.    . metaxalone (SKELAXIN) 800 MG tablet Take 800 mg by mouth 3 (three) times daily.    . Omega-3 Fatty Acids (FISH OIL) 1200 MG CAPS Take 1 capsule by mouth daily.    Marland Kitchen OVER THE COUNTER MEDICATION Takes folic acid 1 tab daily.    Marland Kitchen OVER THE COUNTER MEDICATION Vitamin B 6 1 daily    . Riboflavin 100 MG CAPS Take 200 mg by mouth daily.    . rosuvastatin (CRESTOR) 10 MG tablet TAKE 1 TABLET BY MOUTH EVERY DAY 90 tablet 1  . TURMERIC CURCUMIN PO Take by mouth.    . TURMERIC PO Take 1 capsule by mouth daily.    Marland Kitchen zolmitriptan (ZOMIG-ZMT) 2.5 MG disintegrating tablet DIS ONE T PO UTD PRF MIGRAINE  5   No current facility-administered medications on file prior to visit.    Allergies:  Allergies  Allergen Reactions  . Aleve [Naproxen Sodium] Anaphylaxis and Other (See Comments)    GI Upset GI Upset GI Upset   . Lipitor [Atorvastatin] Anaphylaxis and Other (See Comments)  . Niacin And Related Anaphylaxis  . Omnaris [Ciclesonide] Anaphylaxis    migraine migraine  . Ceftin [Cefuroxime Axetil] Rash    Other Reaction: Rash and gi upset Other Reaction: Rash and gi upset  . Other Other (See Comments)  . Levaquin [Levofloxacin In D5w]     Muscle cramps  . Simvastatin   . Zetia [Ezetimibe]    Medical History:  She has Recurrent acute sinusitis; Hyperlipidemia; GERD (gastroesophageal reflux disease); Cervical spondylosis without myelopathy; Headache, migraine; Elevated BP without diagnosis of hypertension; Vitamin D  deficiency; Medication management; Age-related osteoporosis without current pathological fracture; Hypothyroidism; and Osteoporosis on their problem list. Health Maintenance:   Immunization History  Administered Date(s) Administered  . PPD Test 09/08/2013  . Pneumococcal-Unspecified 08/01/2007  . Tdap 07/31/2011  . Zoster 11/21/2016    Tetanus: 2012, Due 2022 Pneumovax: 2009, unsure is 23 or 13? Will check records. Flu vaccine: Declined Zostavax: 2018 LMP: Post[place] Pap: 2017, due 2020 MGM: 2012, Due DEXA: 2019 Colonoscopy: Due 2023   Last Dental Exam: 2019 Last Eye Exam:  2019 Patient Care Team: Unk Pinto, MD as PCP - General (Internal Medicine) Ladene Artist, MD as Consulting Physician (Gastroenterology) Fay Records, MD as Consulting  Physician (Cardiology) Jessy Oto, MD as Consulting Physician (Orthopedic Surgery) Deneise Lever, MD as Consulting Physician (Pulmonary Disease) Amil Amen, MD as Referring Physician (Neurology) Dorene Ar, MD (Pain Medicine)  Surgical History:  She has a past surgical history that includes abdominal wall tear; Mouth surgery; fractured right wrist; Wrist surgery (Right, 1973); and LEEP. Family History:  Herfamily history includes Alzheimer's disease in her father; Depression in her mother; Hyperlipidemia in her father; Hypertension in her father; Migraines in her mother; Stroke in her mother. Social History:  She reports that she quit smoking about 24 years ago. She has never used smokeless tobacco. She reports that she does not drink alcohol or use drugs.  Review of Systems: Review of Systems  Constitutional: Negative for chills, diaphoresis, fever, malaise/fatigue and weight loss.  HENT: Negative for congestion, ear discharge, ear pain, hearing loss, nosebleeds, sinus pain, sore throat and tinnitus.   Eyes: Negative for blurred vision, double vision, photophobia, pain, discharge and redness.  Respiratory:  Negative for cough, hemoptysis, sputum production, shortness of breath, wheezing and stridor.   Cardiovascular: Negative for chest pain, palpitations, orthopnea, claudication, leg swelling and PND.  Gastrointestinal: Positive for heartburn. Negative for abdominal pain, blood in stool, constipation, diarrhea, melena, nausea and vomiting.  Genitourinary: Negative for dysuria, flank pain, frequency, hematuria and urgency.  Musculoskeletal: Positive for back pain. Negative for falls, joint pain, myalgias and neck pain.  Skin: Negative for itching and rash.  Neurological: Positive for headaches. Negative for dizziness, tingling, tremors, sensory change, speech change, focal weakness, seizures, loss of consciousness and weakness.  Endo/Heme/Allergies: Negative for environmental allergies and polydipsia. Does not bruise/bleed easily.  Psychiatric/Behavioral: Negative for depression, hallucinations, memory loss, substance abuse and suicidal ideas. The patient is not nervous/anxious and does not have insomnia.     Physical Exam: Estimated body mass index is 18.93 kg/m as calculated from the following:   Height as of this encounter: 5\' 1"  (1.549 m).   Weight as of this encounter: 100 lb 3.2 oz (45.5 kg). BP 137/90   Pulse 77   Temp 97.7 F (36.5 C)   Ht 5\' 1"  (1.549 m)   Wt 100 lb 3.2 oz (45.5 kg)   SpO2 99%   BMI 18.93 kg/m  General Appearance: Well nourished, in no apparent distress.  Eyes: PERRLA, EOMs, conjunctiva no swelling or erythema, normal fundi and vessels.  Sinuses: No Frontal/maxillary tenderness  ENT/Mouth: Ext aud canals clear, normal light reflex with TMs without erythema, bulging. Good dentition. No erythema, swelling, or exudate on post pharynx. Tonsils not swollen or erythematous. Hearing normal.  Neck: Supple, thyroid normal. No bruits  Respiratory: Respiratory effort normal, BS equal bilaterally without rales, rhonchi, wheezing or stridor.  Cardio: RRR without murmurs, rubs  or gallops. Brisk peripheral pulses without edema.  Chest: symmetric, with normal excursions and percussion.  Breasts: Symmetric, without lumps, nipple discharge, retractions.  Abdomen: Soft, nontender, no guarding, rebound, hernias, masses, or organomegaly.  Lymphatics: Non tender without lymphadenopathy.  Genitourinary:  Musculoskeletal: Full ROM all peripheral extremities,5/5 strength, and normal gait. Pain with palpation to cervical and lumbar spine. Skin: Warm, dry without rashes, lesions, ecchymosis. Neuro: Cranial nerves intact, reflexes equal bilaterally. Normal muscle tone, no cerebellar symptoms. Sensation intact.  Psych: Awake and oriented X 3, normal affect, Insight and Judgment appropriate.   EKG: WNL no ST changes. sinilar to last year, will check yearly. Also followed by cardiology.     Garnet Sierras, NP 7:20 AM [facility]

## 2018-06-04 NOTE — Patient Instructions (Addendum)
Blood pressure 137/90 today Goal for you is 130/80 or less Check you blood pressure twice a day and write them down If you blood pressure is higher than 140/90 consistently please let our office know.    We will contact you via MyChart with lab results in 1-2 days.   Osteoporosis Osteoporosis happens when your bones become thinner and weaker. Weak bones can break (fracture) more easily when you slip or fall. Bones most at risk of breaking are in the hip, wrist, and spine. Follow these instructions at home:  Get enough calcium and vitamin D. These nutrients are good for your bones.  Exercise as told by your doctor.  Do not use any tobacco products. This includes cigarettes, chewing tobacco, and electronic cigarettes. If you need help quitting, ask your doctor.  Limit the amount of alcohol you drink.  Take medicines only as told by your doctor.  Keep all follow-up visits as told by your doctor. This is important.  Take care at home to prevent falls. Some ways to do this are: ? Keep rooms well lit and tidy. ? Put safety rails on your stairs. ? Put a rubber mat in the bathroom and other places that are often wet or slippery. Get help right away if:  You fall.  You hurt yourself. This information is not intended to replace advice given to you by your health care provider. Make sure you discuss any questions you have with your health care provider. Document Released: 10/09/2011 Document Revised: 12/23/2015 Document Reviewed: 12/25/2013 Elsevier Interactive Patient Education  [employer].

## 2018-06-06 ENCOUNTER — Encounter: Payer: Self-pay | Admitting: Adult Health Nurse Practitioner

## 2018-06-06 LAB — COMPLETE METABOLIC PANEL WITHOUT GFR
AG Ratio: 1.8 (calc) (ref 1.0–2.5)
AST: 27 U/L (ref 10–35)
Alkaline phosphatase (APISO): 28 U/L — ABNORMAL LOW (ref 33–130)
CO2: 30 mmol/L (ref 20–32)
Calcium: 10.1 mg/dL (ref 8.6–10.4)
Creat: 0.91 mg/dL (ref 0.50–0.99)
GFR, Est Non African American: 67 mL/min/1.73m2 (ref >=60)
Potassium: 4.2 mmol/L (ref 3.5–5.3)
Sodium: 140 mmol/L (ref 135–146)

## 2018-06-06 LAB — COMPLETE METABOLIC PANEL WITH GFR
ALT: 14 U/L (ref 6–29)
Albumin: 4.7 g/dL (ref 3.6–5.1)
BUN: 18 mg/dL (ref 7–25)
Chloride: 102 mmol/L (ref 98–110)
GFR, Est African American: 78 mL/min/{1.73_m2} (ref >=60)
Globulin: 2.6 g/dL (calc) (ref 1.9–3.7)
Glucose, Bld: 77 mg/dL (ref 65–99)
Total Bilirubin: 0.3 mg/dL (ref 0.2–1.2)
Total Protein: 7.3 g/dL (ref 6.1–8.1)

## 2018-06-06 LAB — LIPID PANEL
Cholesterol: 221 mg/dL — ABNORMAL HIGH (ref <200)
HDL: 72 mg/dL (ref >50)
LDL Cholesterol (Calc): 120 mg/dL (calc) — ABNORMAL HIGH
Non-HDL Cholesterol (Calc): 149 mg/dL (calc) — ABNORMAL HIGH (ref <130)
Total CHOL/HDL Ratio: 3.1 (calc) (ref <5.0)
Triglycerides: 175 mg/dL — ABNORMAL HIGH (ref <150)

## 2018-06-06 LAB — HEPATIC FUNCTION PANEL
AG Ratio: 1.8 (calc) (ref 1.0–2.5)
ALT: 14 U/L (ref 6–29)
AST: 27 U/L (ref 10–35)
Albumin: 4.7 g/dL (ref 3.6–5.1)
Alkaline phosphatase (APISO): 28 U/L — ABNORMAL LOW (ref 33–130)
Bilirubin, Direct: 0.1 mg/dL (ref 0.0–0.2)
Globulin: 2.6 g/dL (calc) (ref 1.9–3.7)
Indirect Bilirubin: 0.2 mg/dL (ref 0.2–1.2)
Total Bilirubin: 0.3 mg/dL (ref 0.2–1.2)
Total Protein: 7.3 g/dL (ref 6.1–8.1)

## 2018-06-06 LAB — HEMOGLOBIN A1C
Hgb A1c MFr Bld: 5.5 %{Hb} (ref <5.7)
Mean Plasma Glucose: 111 (calc)
eAG (mmol/L): 6.2 (calc)

## 2018-06-06 LAB — VITAMIN B12: Vitamin B-12: >2000 pg/mL — ABNORMAL HIGH (ref 200–1100)

## 2018-06-06 LAB — URINALYSIS W MICROSCOPIC + REFLEX CULTURE
Bacteria, UA: NONE SEEN /HPF
Bilirubin Urine: NEGATIVE
Glucose, UA: NEGATIVE
Hgb urine dipstick: NEGATIVE
Hyaline Cast: NONE SEEN /LPF
Ketones, ur: NEGATIVE
Nitrites, Initial: NEGATIVE
Protein, ur: NEGATIVE
RBC / HPF: NONE SEEN /HPF (ref 0–2)
Specific Gravity, Urine: 1.009 (ref 1.001–1.03)
Squamous Epithelial / HPF: NONE SEEN /HPF (ref <=5)
WBC, UA: NONE SEEN /HPF (ref 0–5)
pH: 7.5 (ref 5.0–8.0)

## 2018-06-06 LAB — URINE CULTURE
MICRO NUMBER:: 91336042
SPECIMEN QUALITY:: ADEQUATE

## 2018-06-06 LAB — CBC WITH DIFFERENTIAL/PLATELET
Basophils Absolute: 38 cells/uL (ref 0–200)
Basophils Relative: 0.4 %
Eosinophils Absolute: 47 cells/uL (ref 15–500)
Eosinophils Relative: 0.5 %
HCT: 41.2 % (ref 35.0–45.0)
Hemoglobin: 13.4 g/dL (ref 11.7–15.5)
Lymphs Abs: 2331 cells/uL (ref 850–3900)
MCH: 28.8 pg (ref 27.0–33.0)
MCHC: 32.5 g/dL (ref 32.0–36.0)
MCV: 88.6 fL (ref 80.0–100.0)
MPV: 10.4 fL (ref 7.5–12.5)
Monocytes Relative: 8.2 %
Neutro Abs: 6213 cells/uL (ref 1500–7800)
Neutrophils Relative %: 66.1 %
Platelets: 320 10*3/uL (ref 140–400)
RBC: 4.65 Million/uL (ref 3.80–5.10)
RDW: 12.8 % (ref 11.0–15.0)
Total Lymphocyte: 24.8 %
WBC mixed population: 771 cells/uL (ref 200–950)
WBC: 9.4 Thousand/uL (ref 3.8–10.8)

## 2018-06-06 LAB — CULTURE INDICATED

## 2018-06-06 LAB — MAGNESIUM: Magnesium: 2.3 mg/dL (ref 1.5–2.5)

## 2018-06-06 LAB — TSH: TSH: 1.85 m[IU]/L (ref 0.40–4.50)

## 2018-06-06 LAB — INSULIN, RANDOM: Insulin: 3.2 u[IU]/mL (ref 2.0–19.6)

## 2018-06-06 LAB — VITAMIN D 25 HYDROXY (VIT D DEFICIENCY, FRACTURES): Vit D, 25-Hydroxy: 73 ng/mL (ref 30–100)

## 2018-06-06 LAB — IRON, TOTAL/TOTAL IRON BINDING CAP
%SAT: 15 % — ABNORMAL LOW (ref 16–45)
Iron: 65 ug/dL (ref 45–160)
TIBC: 429 ug/dL (ref 250–450)

## 2018-06-20 ENCOUNTER — Ambulatory Visit: Payer: BLUE CROSS/BLUE SHIELD | Admitting: Internal Medicine

## 2018-06-20 VITALS — BP 138/84 | HR 64 | Temp 97.9°F | Resp 16 | Ht 61.0 in | Wt 95.8 lb

## 2018-06-20 DIAGNOSIS — E782 Mixed hyperlipidemia: Secondary | ICD-10-CM | POA: Diagnosis not present

## 2018-06-20 DIAGNOSIS — E559 Vitamin D deficiency, unspecified: Secondary | ICD-10-CM | POA: Diagnosis not present

## 2018-06-20 DIAGNOSIS — G43809 Other migraine, not intractable, without status migrainosus: Secondary | ICD-10-CM

## 2018-06-20 DIAGNOSIS — E039 Hypothyroidism, unspecified: Secondary | ICD-10-CM | POA: Diagnosis not present

## 2018-06-20 DIAGNOSIS — Z79899 Other long term (current) drug therapy: Secondary | ICD-10-CM

## 2018-06-20 DIAGNOSIS — R0989 Other specified symptoms and signs involving the circulatory and respiratory systems: Secondary | ICD-10-CM

## 2018-06-20 NOTE — Progress Notes (Signed)
Subjective:    Patient ID: Isabel [name], female    DOB: 11/04/1955, 62 y.o.   MRN: [id number]  HPI       This Very nice 62 yo [name] presents for med review. She had extended OV about 2 weeks ago and all extensive labs returned Normal. She discussed starting Prolia with Dr Cruzita Lederer. We discussed her controlled HTN, HLD, GERD sx's and hx/o HA's.   Medication Sig  . AIMOVIG 140 MG/ML SOAJ   . amLODipine  5 MG tablet TAKE 1 TABLET  EVERY DAY  . CALCIUM 600+D3  Take by mouth.  Marland Kitchen VITAMIN D 2000 units  Take 6,000 capsules by mouth daily.  . B12 LIQUID HEALTH BOOSTER  Take by mouth. 1/2 -3/4 eye dropper daily  . diazepam (VALIUM) 10 MG tablet Take 5 mg by mouth at bedtime as needed for anxiety.   . Ginkgo Biloba 40 MG TABS Take by mouth.  . GUGLIPID/BIOPERINE Take by mouth.  . levothyroxine  25 MCG tablet Take 1 tablet (25 mcg total) by mouth daily.  . magnesium 500 MG tablet Take 500 mg by mouth daily.   . Melatonin 1 MG TABS Take 1 tablet by mouth at bedtime.  . metaxalone 800 MG tablet Take 800 mg by mouth 3 (three) times daily.  . Omega-3 FISH OIL 1200 MG CAPS Take 1 capsule by mouth daily.  . folic acid Takes  1 tab daily.  . Vitamin B 6  1 daily  . Riboflavin 100 MG CAPS Take 200 mg by mouth daily.  . rosuvastatin  10 MG tablet TAKE 1 TABLET BY MOUTH EVERY DAY  . TURMERIC CURCUMIN Take by mouth.  . TURMERIC  Take 1 capsule by mouth daily.  Marland Kitchen ZOMIG-ZMT 2.5 MG  DIS ONE T PO UTD PRF MIGRAINE   Allergies  Allergen Reactions  . Aleve [Naproxen Sodium] Rash       . Lipitor [Atorvastatin] Other (See Comments)  . Niacin And Related Anaphylaxis  . Omnaris [Ciclesonide] Other (See Comments)    headache  . Ceftin [Cefuroxime Axetil] Rash  . Other Other (See Comments)  . Levaquin [Levofloxacin In D5w]     Muscle cramps  . Simvastatin   . Zetia [Ezetimibe]    Past Medical History:  Diagnosis Date  . Carpal tunnel syndrome on right   . History of concussion    1972, 1977, 1983  .  Hyperlipidemia   . Hypertension   . Migraines    pain clinic [facility]  . Osteoporosis    Past Surgical History:  Procedure Laterality Date  . 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 fractured right wrist    . LEEP     early 20's  . MOUTH SURGERY    . WRIST SURGERY Right 1973   Review of Systems    10 point systems review negative except as above.    Objective:   Physical Exam  BP 138/84   Pulse 64   Temp 97.9 F (36.6 C)   Resp 16   Ht 5\' 1"  (1.549 m)   Wt 95 lb 12.8 oz (43.5 kg)   BMI 18.10 kg/m   HEENT - WNL. Neck - supple.  Chest - Clear equal BS. Cor - Nl HS. RRR w/o sig MGR. PP 1(+). No edema. MS- FROM w/o deformities.  Gait Nl. Neuro -  Nl w/o focal abnormalities.    Assessment & Plan:   1. Labile hypertension  2. Hypothyroidism,   3. Mixed hyperlipidemia  4. Vitamin D deficiency  5. Migraine , not intractable  6. Medication management  - Labs reviewed with patient   - discussed medications & SE's.   Over 42minutes of exam, counseling, chart review and  decision making was performed          .

## 2018-06-22 ENCOUNTER — Encounter: Payer: Self-pay | Admitting: Internal Medicine

## 2018-06-24 ENCOUNTER — Other Ambulatory Visit: Payer: Self-pay | Admitting: Adult Health Nurse Practitioner

## 2018-06-24 DIAGNOSIS — R0989 Other specified symptoms and signs involving the circulatory and respiratory systems: Secondary | ICD-10-CM

## 2018-07-30 ENCOUNTER — Ambulatory Visit: Payer: BLUE CROSS/BLUE SHIELD | Admitting: Internal Medicine

## 2018-07-30 VITALS — BP 104/78 | HR 84 | Temp 97.5°F | Resp 16 | Ht 61.0 in | Wt 98.4 lb

## 2018-07-30 DIAGNOSIS — J0410 Acute tracheitis without obstruction: Secondary | ICD-10-CM

## 2018-07-30 DIAGNOSIS — J0100 Acute maxillary sinusitis, unspecified: Secondary | ICD-10-CM | POA: Diagnosis not present

## 2018-07-30 MED ORDER — DEXAMETHASONE 0.5 MG PO TABS: ORAL_TABLET | ORAL | 0 refills | Status: DC

## 2018-07-30 MED ORDER — BENZONATATE 200 MG PO CAPS: ORAL_CAPSULE | ORAL | 1 refills | Status: DC

## 2018-07-30 MED ORDER — AZITHROMYCIN 250 MG PO TABS: ORAL_TABLET | ORAL | 1 refills | Status: DC

## 2018-07-30 NOTE — Progress Notes (Signed)
Subjective:    Patient ID: Isabel [name], female    DOB: [birth year], 62 y.o.   MRN: [id number]  HPI    This very nice 62 yo [name] presents with 3-4 day prodrome of worsening head & chest congestion & productive cough - yellowish green sputum. Denies fever, chills , sweats, rash or dyspnea.   Medication Sig  . AIMOVIG 140 MG/ML SOAJ   . amLODipine (NORVASC) 5 MG tablet TAKE 1 TABLET BY MOUTH EVERY DAY  . Calcium Carb-Cholecalciferol (CALCIUM 600+D3 PO) Take by mouth.  . Cholecalciferol (VITAMIN D) 2000 units CAPS Take 6,000 capsules by mouth daily.  . cyanocobalamin 1000 MCG tablet Take 1,000 mcg by mouth daily.  . diazepam (VALIUM) 10 MG tablet Take 5 mg by mouth at bedtime as needed for anxiety.   . Ginkgo Biloba 40 MG TABS Take by mouth.  . Guggulipid-Black Pepper (GUGLIPID/BIOPERINE PO) Take by mouth.  . levothyroxine (SYNTHROID) 25 MCG tablet Take 1 tablet (25 mcg total) by mouth daily.  . magnesium gluconate (MAGONATE) 500 MG tablet Take 500 mg by mouth daily.   . Melatonin 1 MG TABS Take 1 tablet by mouth at bedtime.  . metaxalone (SKELAXIN) 800 MG tablet Take 800 mg by mouth 3 (three) times daily.  . Omega-3 Fatty Acids (FISH OIL) 1200 MG CAPS Take 1 capsule by mouth daily.  Marland Kitchen OVER THE COUNTER MEDICATION Takes folic acid 1 tab daily.  Marland Kitchen OVER THE COUNTER MEDICATION Vitamin B 6 1 daily  . Riboflavin 100 MG CAPS Take 200 mg by mouth daily.  . rosuvastatin (CRESTOR) 10 MG tablet TAKE 1 TABLET BY MOUTH EVERY DAY  . TURMERIC PO Take 1 capsule by mouth daily.  Marland Kitchen zolmitriptan (ZOMIG-ZMT) 2.5 MG disintegrating tablet DIS ONE T PO UTD PRF MIGRAINE   Allergies  Allergen Reactions  . Aleve [Naproxen Sodium] Rash       . Lipitor [Atorvastatin] Other (See Comments)  . Niacin And Related Anaphylaxis  . Omnaris [Ciclesonide] Other (See Comments)    headache  . Ceftin [Cefuroxime Axetil] Rash  . Other Other (See Comments)  . Levaquin [Levofloxacin In D5w]     Muscle cramps  . Simvastatin    . Zetia [Ezetimibe]    Past Medical History:  Diagnosis Date  . Carpal tunnel syndrome on right   . History of concussion    1972, 1977, 1983  . Hyperlipidemia   . Hypertension   . Migraines    pain clinic [place]  . Osteoporosis    Review of Systems    10 point systems review negative except as above.    Objective:   Physical Exam Pulse 84   Temp (!) 97.5 F (36.4 C)   Resp 16   Ht 5\' 1"  (1.549 m)   Wt 98 lb 6.4 oz (44.6 kg)   BMI 18.59 kg/m     O2 sat 98%  In No Distress. No stridor. Speech hoarse. Cough congested.   HEENT - Eac's patent. TM's Nl. EOM's full. PERRLA.  NasoOroPharynx clear. Fronto-maxillary sinuses sl tender Neck - supple. Nl Thyroid. Chest - Scattered bilat  Rales & Rhonchi, no wheezes. Cor - Nl HS. RRR w/o sig m. No edema. Abd - No palpable organomegaly, masses or tenderness. BS nl. MS- FROM w/o deformities. Muscle power, tone and bulk Nl. Gait Nl. Neuro - No obvious Cr N abnormalities.  Nl w/o focal abnormalities. Skin - exposed clear w/o rash cyanosis or icterus    Assessment & Plan:   1.  Tracheitis  - dexamethasone 0.5 MG tab; Take 1 tab 3 x day - 3 days, then 2 x day - 3 days, then 1 tab daily  Disp: 20 tab  - benzonatate  200 MG cap; Take 1 perle 3 x / day to prevent cough  Disp: 30 caps; Rf: 1  - azithromycin 250 MG; Take 2 tab   Day 1,  then 1 tab daily - Days 2 - 5.  Disp: 6 ; Rf: 1  2. Acute non-recurrent maxillary sinusitis  - azithromycin 250 MG; Take 2 tab  Day 1,  then 1 tab daily- Days 2 - 5.  Disp: 6 each; Rf: 1  - Discussed meds / SE's and  Return / ER precautions

## 2018-07-31 ENCOUNTER — Encounter: Payer: Self-pay | Admitting: Internal Medicine

## 2018-08-09 NOTE — Progress Notes (Signed)
Assessment and Plan:  Isabel [name] was seen today for acute visit, other and chills.  Diagnoses and all orders for this visit:  Dry cough Recurrent dry cough; suspect post-viral cough, did improve with initial treatment, exam is benign today other than cough Discussed the importance of avoiding unnecessary antibiotic therapy, see no indication for this today Suggested symptomatic OTC remedies, recommended voice rest, wear mask outside in cold/dry air, hot steam baths Will repeat prednisone taper, refill benzonatate Follow up as needed, discussed CXR today as last was 5 years ago, she will follow up to obtain this if symptoms not resolving in another week or two Consider silent reflux if persistent, though recently well controlled and symptoms did initially improve with URI treatment -     predniSONE (DELTASONE) 20 MG tablet; 2 tablets daily for 3 days, 1 tablet daily for 4 days. -     benzonatate (TESSALON) 200 MG capsule; Take 1 cap up to 3 times a day as needed for cough.   Further disposition pending results of labs. Discussed med's effects and SE's.   Over 15 minutes of exam, counseling, chart review, and critical decision making was performed.   Future Appointments  Date Time Provider [place]  08/12/2018  2:30 PM Liane Comber, NP [facility] None  09/23/2018  3:45 PM Liane Comber, NP [facility] None  11/25/2018  2:00 PM Philemon Kingdom, MD [facility] None  06/09/2019  2:00 PM Liane Comber, NP [facility] None    ------------------------------------------------------------------------------------------------------------------   HPI 63 y.o.female presents for evaluation of persistent URI symptoms x 2 weeks; she reports this began with a "chest cold" - non-productive cough,Seen 07/30/2018 by Dr. Melford Aase, dx tracheitis, sinusitis, given zpak, prednisone, promethazine DM. Felt better initially, but then after a long flight to [place] last week she started to feel"bad",  now having chest cough again, feels wet but not getting much up, she denies chest pain, dyspnea, wheezing. She just got back last night. Wasn't able to restart promazine-DM until this AM, and so far not very helpful.   She has remote hx of smoking, quit 20 years ago, smoked 1-1.5 packs per day for 15-20 years. COPD per last CXR 11/26/2012, PFTs from 01/2013 were essentially normal. Hx of bronchitis, no hx of pneumonia. Denies exertional dyspnea, AM secretions. She denies hx of allergies. Has never needed inhalers.    Past Medical History:  Diagnosis Date  . Carpal tunnel syndrome on right   . History of concussion    1972, 1977, 1983  . Hyperlipidemia   . Hypertension   . Migraines    pain clinic [place]  . Osteoporosis      Allergies  Allergen Reactions  . Aleve [Naproxen Sodium] Rash       . Lipitor [Atorvastatin] Other (See Comments)  . Niacin And Related Anaphylaxis  . Omnaris [Ciclesonide] Other (See Comments)    headache  . Ceftin [Cefuroxime Axetil] Rash  . Other Other (See Comments)  . Levaquin [Levofloxacin In D5w]     Muscle cramps  . Simvastatin   . Zetia [Ezetimibe]     Current Outpatient Medications on File Prior to Visit  Medication Sig  . AIMOVIG 140 MG/ML SOAJ   . amLODipine (NORVASC) 5 MG tablet TAKE 1 TABLET BY MOUTH EVERY DAY  . azithromycin (ZITHROMAX) 250 MG tablet Take 2 tablets (500 mg) on  Day 1,  followed by 1 tablet (250 mg) once daily on Days 2 through 5.  . benzonatate (TESSALON) 200 MG capsule Take 1 perle 3  x / day to prevent cough  . Calcium Carb-Cholecalciferol (CALCIUM 600+D3 PO) Take by mouth.  . Cholecalciferol (VITAMIN D) 2000 units CAPS Take 6,000 capsules by mouth daily.  . cyanocobalamin 1000 MCG tablet Take 1,000 mcg by mouth daily.  Marland Kitchen dexamethasone (DECADRON) 0.5 MG tablet Take 1 tab 3 x day - 3 days, then 2 x day - 3 days, then 1 tab daily  . diazepam (VALIUM) 10 MG tablet Take 5 mg by mouth at bedtime as needed for anxiety.   . Ginkgo  Biloba 40 MG TABS Take by mouth.  . Guggulipid-Black Pepper (GUGLIPID/BIOPERINE PO) Take by mouth.  . levothyroxine (SYNTHROID) 25 MCG tablet Take 1 tablet (25 mcg total) by mouth daily.  . magnesium gluconate (MAGONATE) 500 MG tablet Take 500 mg by mouth daily.   . Melatonin 1 MG TABS Take 1 tablet by mouth at bedtime.  . metaxalone (SKELAXIN) 800 MG tablet Take 800 mg by mouth 3 (three) times daily.  . Omega-3 Fatty Acids (FISH OIL) 1200 MG CAPS Take 1 capsule by mouth daily.  Marland Kitchen OVER THE COUNTER MEDICATION Takes folic acid 1 tab daily.  Marland Kitchen OVER THE COUNTER MEDICATION Vitamin B 6 1 daily  . Riboflavin 100 MG CAPS Take 200 mg by mouth daily.  . rosuvastatin (CRESTOR) 10 MG tablet TAKE 1 TABLET BY MOUTH EVERY DAY  . TURMERIC PO Take 1 capsule by mouth daily.  Marland Kitchen zolmitriptan (ZOMIG-ZMT) 2.5 MG disintegrating tablet DIS ONE T PO UTD PRF MIGRAINE   No current facility-administered medications on file prior to visit.     ROS: all negative except above.   Physical Exam:  There were no vitals taken for this visit.  General Appearance: Well nourished, in no apparent distress. Eyes: PERRLA, EOMs, conjunctiva no swelling or erythema Sinuses: No Frontal/maxillary tenderness ENT/Mouth: Ext aud canals clear, TMs without erythema, bulging. No erythema, swelling, or exudate on post pharynx.  Tonsils not swollen or erythematous. Hearing normal.  Neck: Supple, thyroid normal.  Respiratory: Respiratory effort normal, BS equal bilaterally without rales, rhonchi, wheezing or stridor. Occasional reactive cough.  Cardio: RRR with no MRGs. Brisk peripheral pulses without edema.  Abdomen: Soft, + BS.  Non tender, no guarding, rebound, hernias, masses. Lymphatics: Non tender without lymphadenopathy.  Musculoskeletal: Symmetrical strength, normal gait.  Skin: Warm, dry without rashes, lesions, ecchymosis.  Neuro: Cranial nerves intact. Normal muscle tone, no cerebellar symptoms. Psych: Awake and oriented X  3, normal affect, Insight and Judgment appropriate.     Isabel Ribas, NP 2:45 PM [facility]

## 2018-08-12 ENCOUNTER — Encounter: Payer: Self-pay | Admitting: Adult Health

## 2018-08-12 ENCOUNTER — Ambulatory Visit (INDEPENDENT_AMBULATORY_CARE_PROVIDER_SITE_OTHER): Payer: BLUE CROSS/BLUE SHIELD | Admitting: Adult Health

## 2018-08-12 VITALS — BP 116/72 | HR 86 | Temp 97.7°F | Ht 61.0 in | Wt 99.2 lb

## 2018-08-12 DIAGNOSIS — R05 Cough: Secondary | ICD-10-CM | POA: Diagnosis not present

## 2018-08-12 DIAGNOSIS — R058 Other specified cough: Secondary | ICD-10-CM

## 2018-08-12 MED ORDER — BENZONATATE 200 MG PO CAPS: ORAL_CAPSULE | ORAL | 1 refills | Status: DC

## 2018-08-12 MED ORDER — PREDNISONE 20 MG PO TABS: ORAL_TABLET | ORAL | 0 refills | Status: DC

## 2018-08-12 NOTE — Patient Instructions (Signed)
Avoid cold/dry air  Try steam baths if having persistent/reactive cough   HOW TO TREAT VIRAL COUGH AND COLD SYMPTOMS:  -Symptoms usually last at least 1 week with the worst symptoms being around day 4.  - colds usually start with a sore throat and end with a cough, and the cough can take 2 weeks to get better.  -No antibiotics are needed for colds, flu, sore throats, cough, bronchitis UNLESS symptoms are longer than 7 days OR if you are getting better then get drastically worse.  -There are a lot of combination medications (Dayquil, Nyquil, Vicks 44, tyelnol cold and sinus, ETC). Please look at the ingredients on the back so that you are treating the correct symptoms and not doubling up on medications/ingredients.    Medicines you can use  Nasal congestion  Little Remedies saline spray (aerosol/mist)- can try this, it is in the kids section - pseudoephedrine (Sudafed)- behind the counter, do not use if you have high blood pressure, medicine that have -D in them.  - phenylephrine (Sudafed PE) -Dextormethorphan + chlorpheniramine (Coridcidin HBP)- okay if you have high blood pressure -Oxymetazoline (Afrin) nasal spray- LIMIT to 3 days -Saline nasal spray -Neti pot (used distilled or bottled water)  Ear pain/congestion  -pseudoephedrine (sudafed) - Nasonex/flonase nasal spray  Fever  -Acetaminophen (Tyelnol) -Ibuprofen (Advil, motrin, aleve)  Sore Throat  -Acetaminophen (Tyelnol) -Ibuprofen (Advil, motrin, aleve) -Drink a lot of water -Gargle with salt water - Rest your voice (don't talk) -Throat sprays -Cough drops  Body Aches  -Acetaminophen (Tyelnol) -Ibuprofen (Advil, motrin, aleve)  Headache  -Acetaminophen (Tyelnol) -Ibuprofen (Advil, motrin, aleve) - Exedrin, Exedrin Migraine  Allergy symptoms (cough, sneeze, runny nose, itchy eyes) -Claritin or loratadine cheapest but likely the weakest  -Zyrtec or certizine at night because it can make you sleepy -The  strongest is allegra or fexafinadine  Cheapest at [employer], [employer], [employer]  Cough  -Dextromethorphan (Delsym)- medicine that has DM in it -Guafenesin (Mucinex/Robitussin) - cough drops - drink lots of water  Chest Congestion  -Guafenesin (Mucinex/Robitussin)  Red Itchy Eyes  - Naphcon-A  Upset Stomach  - Bland diet (nothing spicy, greasy, fried, and high acid foods like tomatoes, oranges, berries) -OKAY- cereal, bread, soup, crackers, rice -Eat smaller more frequent meals -reduce caffeine, no alcohol -Loperamide (Imodium-AD) if diarrhea -Prevacid for heart burn  General health when sick  -Hydration -wash your hands frequently -keep surfaces clean -change pillow cases and sheets often -Get fresh air but do not exercise strenuously -Vitamin D, double up on it - Vitamin C -Zinc

## 2018-09-23 ENCOUNTER — Ambulatory Visit: Payer: Self-pay | Admitting: Adult Health

## 2018-10-01 NOTE — Progress Notes (Signed)
FOLLOW UP  Assessment and Plan:   Hypertension Well controlled with current medications  Monitor blood pressure at home; patient to call if consistently greater than 130/80 Continue DASH diet.   Reminder to go to the ER if any CP, SOB, nausea, dizziness, severe HA, changes vision/speech, left arm numbness and tingling and jaw pain.  Cholesterol Currently above goal;  Increase rosuvastatin to 20 mg daily  Continue low cholesterol diet and exercise.  Check lipid panel.   BMi <19 Continue to recommend diet heavy in fruits and veggies and low in animal meats, cheeses, and dairy products, appropriate calorie intake Discuss exercise recommendations routinely Continue to monitor weight at each visit  Vitamin D Def At goal at last visit; continue supplementation to maintain goal of 60-100 Defer Vit D level  Cough Cough- multifactorial-  get on PPI, may need to add H2/refer GI. Get on allergy pill, voice rest, suppress cough with OTC sugar free candy and RX med.     Continue diet and meds as discussed. Further disposition pending results of labs. Discussed med's effects and SE's.   Over 30 minutes of exam, counseling, chart review, and critical decision making was performed.   Future Appointments  Date Time Provider [place]  11/25/2018  2:00 PM Philemon Kingdom, MD [id number] None  06/09/2019  2:00 PM Liane Comber, NP [facility] None    ----------------------------------------------------------------------------------------------------------------------  HPI 63 y.o. female  presents for 3 month follow up on hypertension, cholesterol, glucose, weight and vitamin D deficiency.   She has had migraines since she was 63 y/o; she is on amlodipine 5 mg and aimovig injection for migraines. She uses zolmitriptan as abortive which works well for her. Recently improved.   She is prescribed valium 10 mg at night for sleep; she has attempted other medications and tapering off and  hasn't done well.   She reports intermittent persistent cough since 07/30/2018; had improved, but then seemed to relapse and gotten worse- mainly reports dry intermittent cough, worst at night when she lies down, but does have during the day as well. She is not on allergy med, has hx of GERD but not currently on meds.   BMI is Body mass index is 19.27 kg/m., she has been working on diet and exercise, walks daily with 2 border collies, walks in woods behind her house. She admits to poor water intake, drinks lemonade. She pushes fruits/vegetables, avoids red meat.  Wt Readings from Last 3 Encounters:  10/02/18 102 lb (46.3 kg)  08/12/18 99 lb 3.2 oz (45 kg)  07/30/18 98 lb 6.4 oz (44.6 kg)   Today their BP is BP: 110/72  She does workout. She denies chest pain, shortness of breath, dizziness.   She is on cholesterol medication Rosuvastatin 10 mg daily and denies myalgias. Her cholesterol is not at goal. The cholesterol last visit was:   Lab Results  Component Value Date   CHOL 221 (H) 06/04/2018   HDL 72 06/04/2018   LDLCALC 120 (H) 06/04/2018   TRIG 175 (H) 06/04/2018   CHOLHDL 3.1 06/04/2018    She has been working on diet and exercise for glucose management, and denies increased appetite, nausea, paresthesia of the feet, polydipsia, polyuria, visual disturbances and vomiting. Last A1C in the office was:  Lab Results  Component Value Date   HGBA1C 5.5 06/04/2018   She is on thyroid medication. Her medication was not changed last visit.   Lab Results  Component Value Date   TSH 1.85 06/04/2018  Patient is on Vitamin D supplement and at goal at recent check:   Lab Results  Component Value Date   VD25OH 73 06/04/2018        Current Medications:  Current Outpatient Medications on File Prior to Visit  Medication Sig  . AIMOVIG 140 MG/ML SOAJ   . benzonatate (TESSALON) 200 MG capsule Take 1 cap up to 3 times a day as needed for cough.  Renard Hamper Pepper-Turmeric 3-500 MG CAPS  Take by mouth daily.  . Calcium Carb-Cholecalciferol (CALCIUM 600+D3 PO) Take by mouth.  . Cholecalciferol (VITAMIN D) 2000 units CAPS Take 6,000 capsules by mouth daily.  . cyanocobalamin 1000 MCG tablet Take 1,000 mcg by mouth daily.  . diazepam (VALIUM) 10 MG tablet Take 5 mg by mouth at bedtime as needed for anxiety.   . Ginkgo Biloba 40 MG TABS Take by mouth.  Marland Kitchen KRILL OIL PO Take by mouth daily.  Marland Kitchen levothyroxine (SYNTHROID) 25 MCG tablet Take 1 tablet (25 mcg total) by mouth daily.  . magnesium gluconate (MAGONATE) 500 MG tablet Take 500 mg by mouth daily.   . Melatonin 5 MG TABS Take 1 tablet by mouth at bedtime.   . metaxalone (SKELAXIN) 800 MG tablet Take 800 mg by mouth 3 (three) times daily.  Marland Kitchen OVER THE COUNTER MEDICATION Takes folic acid 1 tab daily.  Marland Kitchen OVER THE COUNTER MEDICATION Vitamin B 6 1 daily  . Riboflavin 100 MG CAPS Take 200 mg by mouth daily.  . rosuvastatin (CRESTOR) 10 MG tablet TAKE 1 TABLET BY MOUTH EVERY DAY  . TURMERIC PO Take 1 capsule by mouth daily.  Marland Kitchen zolmitriptan (ZOMIG-ZMT) 2.5 MG disintegrating tablet DIS ONE T PO UTD PRF MIGRAINE  . Omega-3 Fatty Acids (FISH OIL) 1200 MG CAPS Take 1 capsule by mouth daily.  . predniSONE (DELTASONE) 20 MG tablet 2 tablets daily for 3 days, 1 tablet daily for 4 days.   No current facility-administered medications on file prior to visit.      Allergies:  Allergies  Allergen Reactions  . Aleve [Naproxen Sodium] Rash       . Lipitor [Atorvastatin] Other (See Comments)  . Niacin And Related Anaphylaxis  . Omnaris [Ciclesonide] Other (See Comments)    headache  . Ceftin [Cefuroxime Axetil] Rash  . Other Other (See Comments)  . Levaquin [Levofloxacin In D5w]     Muscle cramps  . Simvastatin   . Zetia [Ezetimibe]      Medical History:  Past Medical History:  Diagnosis Date  . Carpal tunnel syndrome on right   . History of concussion    1972, 1977, 1983  . Hyperlipidemia   . Hypertension   . Migraines     [facility] clinic [place]  . Osteoporosis    Family history- Reviewed and unchanged Social history- Reviewed and unchanged   Review of Systems:  Review of Systems  Constitutional: Negative for malaise/fatigue and weight loss.  HENT: Negative for hearing loss and tinnitus.   Eyes: Negative for blurred vision and double vision.  Respiratory: Positive for cough. Negative for sputum production, shortness of breath and wheezing.   Cardiovascular: Negative for chest pain, palpitations, orthopnea, claudication and leg swelling.  Gastrointestinal: Negative for abdominal pain, blood in stool, constipation, diarrhea, heartburn, melena, nausea and vomiting.  Genitourinary: Negative.   Musculoskeletal: Negative for joint pain and myalgias.  Skin: Negative for rash.  Neurological: Negative for dizziness, tingling, sensory change, weakness and headaches.  Endo/Heme/Allergies: Negative for environmental allergies and polydipsia.  Psychiatric/Behavioral: Negative.   All other systems reviewed and are negative.     Physical Exam: BP 110/72   Pulse 84   Temp (!) 97.3 F (36.3 C)   Ht 5\' 1"  (1.549 m)   Wt 102 lb (46.3 kg)   SpO2 98%   BMI 19.27 kg/m  Wt Readings from Last 3 Encounters:  10/02/18 102 lb (46.3 kg)  08/12/18 99 lb 3.2 oz (45 kg)  07/30/18 98 lb 6.4 oz (44.6 kg)   General Appearance: Well nourished, in no apparent distress. Eyes: PERRLA, EOMs, conjunctiva no swelling or erythema Sinuses: No Frontal/maxillary tenderness ENT/Mouth: Ext aud canals clear, TMs without erythema, bulging. No erythema, swelling, or exudate on post pharynx.  Tonsils not swollen or erythematous. Hearing normal.  Neck: Supple, thyroid normal.  Respiratory: Respiratory effort normal, BS equal bilaterally without rales, rhonchi, wheezing or stridor.  Cardio: RRR with no MRGs. Brisk peripheral pulses without edema.  Abdomen: Soft, + BS.  Non tender, no guarding, rebound, hernias, masses. Lymphatics: Non tender  without lymphadenopathy.  Musculoskeletal: Full ROM, 5/5 strength, Normal gait Skin: Warm, dry without rashes, lesions, ecchymosis.  Neuro: Cranial nerves intact. No cerebellar symptoms.  Psych: Awake and oriented X 3, normal affect, Insight and Judgment appropriate.    Izora Ribas, NP 2:54 PM [facility]

## 2018-10-02 ENCOUNTER — Encounter: Payer: Self-pay | Admitting: Adult Health

## 2018-10-02 ENCOUNTER — Ambulatory Visit (INDEPENDENT_AMBULATORY_CARE_PROVIDER_SITE_OTHER): Payer: BLUE CROSS/BLUE SHIELD | Admitting: Adult Health

## 2018-10-02 ENCOUNTER — Other Ambulatory Visit: Payer: Self-pay | Admitting: Internal Medicine

## 2018-10-02 VITALS — BP 110/72 | HR 84 | Temp 97.3°F | Ht 61.0 in | Wt 102.0 lb

## 2018-10-02 DIAGNOSIS — Z79899 Other long term (current) drug therapy: Secondary | ICD-10-CM | POA: Diagnosis not present

## 2018-10-02 DIAGNOSIS — G4700 Insomnia, unspecified: Secondary | ICD-10-CM

## 2018-10-02 DIAGNOSIS — E559 Vitamin D deficiency, unspecified: Secondary | ICD-10-CM

## 2018-10-02 DIAGNOSIS — E782 Mixed hyperlipidemia: Secondary | ICD-10-CM

## 2018-10-02 DIAGNOSIS — I10 Essential (primary) hypertension: Secondary | ICD-10-CM

## 2018-10-02 DIAGNOSIS — E039 Hypothyroidism, unspecified: Secondary | ICD-10-CM

## 2018-10-02 DIAGNOSIS — Z681 Body mass index (BMI) 19 or less, adult: Secondary | ICD-10-CM

## 2018-10-02 DIAGNOSIS — G43809 Other migraine, not intractable, without status migrainosus: Secondary | ICD-10-CM | POA: Diagnosis not present

## 2018-10-02 MED ORDER — ROSUVASTATIN CALCIUM 20 MG PO TABS: 10.0000 mg | ORAL_TABLET | Freq: Every day | ORAL | 1 refills | Status: DC

## 2018-10-02 MED ORDER — OMEPRAZOLE 20 MG PO CPDR: DELAYED_RELEASE_CAPSULE | ORAL | 1 refills | Status: DC

## 2018-10-02 NOTE — Patient Instructions (Addendum)
Goals    . DIET - INCREASE WATER INTAKE     Increase fluids aiming for 50+ fluid ounces daily.     Marland Kitchen LDL CALC < 100      Increase rosuvastatin to 20 mg daily     Try adding Allegra (fexofenadine)   Or, try flonase (fluticasone) and/or astalin (azelastin) nasal spray (over the counter) for nasal congestion     Generally a cough is either coming from above or from below- so we will treat this OR it can be from irritation/viral cough  To treat the nasal drip: Get on the chlorphenirmine every 6 hours- This medication can make you sleepy but helps with nasal drip- get from over the counter.   Can do a steroid nasal spary 1-2 sparys at night each nostril. Remember to spray each nostril twice towards the outer part of your eye.  Do not sniff but instead pinch your nose and tilt your head back to help the medicine get into your sinuses.  The best time to do this is at bedtime. Stop if you get blurred vision or nose bleeds.   To treat the reflux Will send in prilosec 40 mg to take once in the morning and take prevacid from over the counter at night for 2 weeks- then stop the prilosec and continue the pravacid or famotadine  To stop irritation: Need to STOP the cough Do sugar free candy Do the tessalon drops VOICE REST is VERY important  If not better in 2 weeks will refer to ENT  Go to the ER or call the office if you get any chest pain, shortness of breath, severe  headache, leg swelling.    Common causes of cough OR hoarseness OR sore throat:   Allergies, Viral Infections, Acid Reflux and Bacterial Infections.    Allergies and viral infections cause a cough OR sore throat by post nasal drip and are often worse at night, can also have sneezing, lower grade fevers, clear/yellow mucus. This is best treated with allergy medications or nasal sprays.  Please get on allegra for 1-2 weeks The strongest is allegra or fexafinadine  Cheapest at [employer], [employer], [employer]   Bacterial  infections are more severe than allergies or viral infections with fever, teeth pain, fatigue. This can be treated with prednisone and the same over the counter medication and after 7 days can be treated with an antibiotic.   Silent reflux/GERD can cause a cough OR sore throat OR hoarseness WITHOUT heart burn because the esophagus that goes to the stomach and trachea that goes to the lungs are very close and when you lay down the acid can irritate your throat and lungs. This can cause hoarseness, cough, and wheezing. Please stop any alcohol or anti-inflammatories like aleve/advil/ibuprofen and start an over the counter Prilosec or omeprazole 1-2 times daily 66mins before food for 2 weeks, then switch to over the counter zantac/ratinidine or pepcid/famotadine once at night for 2 weeks.    sometimes irritation causes more irritation. Try voice rest, use sugar free cough drops to prevent coughing, and try to stop clearing your throat.   If you ever have a cough that does not go away after trying these things please make a follow up visit for further evaluation or we can refer you to a specialist. Or if you ever have shortness of breath or chest pain go to the ER.    Silent reflux: Not all heartburn burns...Marland KitchenMarland KitchenMarland Kitchen  What is LPR? Laryngopharyngeal reflux (LPR) or silent  reflux is a condition in which acid that is made in the stomach travels up the esophagus (swallowing tube) and gets to the throat. Not everyone with reflux has a lot of heartburn or indigestion. In fact, many people with LPR never have heartburn. This is why LPR is called SILENT REFLUX, and the terms "Silent reflux" and "LPR" are often used interchangeably. Because LPR is silent, it is sometimes difficult to diagnose.  How can you tell if you have LPR?  Marland Kitchen Chronic hoarseness- Some people have hoarseness that comes and goes . throat clearing  . Cough . It can cause shortness of breath and cause asthma like symptoms. Marland Kitchen a feeling of a lump in  the throat  . difficulty swallowing . a problem with too much nose and throat drainage.  . Some people will feel their esophagus spasm which feels like their heart beating hard and fast, this will usually be after a meal, at rest, or lying down at night.    How do I treat this? Treatment for LPR should be individualized, and your doctor will suggest the best treatment for you. Generally there are several treatments for LPR: . changing habits and diet to reduce reflux,  . medications to reduce stomach acid, and  . surgery to prevent reflux. Most people with LPR need to modify how and when they eat, as well as take some medication, to get well. Sometimes, nonprescription liquid antacids, such as Maalox, Gelucil and Mylanta are recommended. When used, these antacids should be taken four times each day - one tablespoon one hour after each meal and before bedtime. Dietary and lifestyle changes alone are not often enough to control LPR - medications that reduce stomach acid are also usually needed. These must be prescribed by our doctor.   TIPS FOR REDUCING REFLUX AND LPR Control your LIFE-STYLE and your DIET! Marland Kitchen If you use tobacco, QUIT.  Marland Kitchen Smoking makes you reflux. After every cigarette you have some LPR.  . Don't wear clothing that is too tight, especially around the waist (trousers, corsets, belts).  . Do not lie down just after eating...in fact, do not eat within three hours of bedtime.  . You should be on a low-fat diet.  . Limit your intake of red meat.  . Limit your intake of butter.  Marland Kitchen Avoid fried foods.  . Avoid chocolate  . Avoid cheese.  Marland Kitchen Avoid eggs. Marland Kitchen Specifically avoid caffeine (especially coffee and tea), soda pop (especially cola) and mints.  . Avoid alcoholic beverages, particularly in the evening.

## 2018-10-03 LAB — CBC WITH DIFFERENTIAL/PLATELET
ABSOLUTE MONOCYTES: 828 {cells}/uL (ref 200–950)
Basophils Absolute: 41 cells/uL (ref 0–200)
Basophils Relative: 0.5 %
Eosinophils Absolute: 82 cells/uL (ref 15–500)
Eosinophils Relative: 1 %
HCT: 39.4 % (ref 35.0–45.0)
Hemoglobin: 13.2 g/dL (ref 11.7–15.5)
LYMPHS ABS: 2025 {cells}/uL (ref 850–3900)
MCH: 29.2 pg (ref 27.0–33.0)
MCHC: 33.5 g/dL (ref 32.0–36.0)
MCV: 87.2 fL (ref 80.0–100.0)
MONOS PCT: 10.1 %
MPV: 10.6 fL (ref 7.5–12.5)
NEUTROS ABS: 5223 {cells}/uL (ref 1500–7800)
NEUTROS PCT: 63.7 %
PLATELETS: 269 10*3/uL (ref 140–400)
RBC: 4.52 10*6/uL (ref 3.80–5.10)
RDW: 14 % (ref 11.0–15.0)
TOTAL LYMPHOCYTE: 24.7 %
WBC: 8.2 10*3/uL (ref 3.8–10.8)

## 2018-10-03 LAB — LIPID PANEL
CHOLESTEROL: 197 mg/dL (ref <200)
HDL: 62 mg/dL (ref >=50)
LDL CHOLESTEROL (CALC): 99 mg/dL
Non-HDL Cholesterol (Calc): 135 mg/dL (calc) — ABNORMAL HIGH (ref <130)
Total CHOL/HDL Ratio: 3.2 (calc) (ref <5.0)
Triglycerides: 239 mg/dL — ABNORMAL HIGH (ref <150)

## 2018-10-03 LAB — COMPLETE METABOLIC PANEL WITH GFR
AG RATIO: 2.1 (calc) (ref 1.0–2.5)
ALBUMIN MSPROF: 4.6 g/dL (ref 3.6–5.1)
ALKALINE PHOSPHATASE (APISO): 22 U/L — AB (ref 37–153)
ALT: 13 U/L (ref 6–29)
AST: 26 U/L (ref 10–35)
BUN: 15 mg/dL (ref 7–25)
CHLORIDE: 101 mmol/L (ref 98–110)
CO2: 31 mmol/L (ref 20–32)
Calcium: 10.2 mg/dL (ref 8.6–10.4)
Creat: 0.98 mg/dL (ref 0.50–0.99)
GFR, Est African American: 71 mL/min/{1.73_m2} (ref >=60)
GFR, Est Non African American: 61 mL/min/{1.73_m2} (ref >=60)
GLUCOSE: 77 mg/dL (ref 65–99)
Globulin: 2.2 g/dL (calc) (ref 1.9–3.7)
POTASSIUM: 4.6 mmol/L (ref 3.5–5.3)
Sodium: 139 mmol/L (ref 135–146)
Total Bilirubin: 0.4 mg/dL (ref 0.2–1.2)
Total Protein: 6.8 g/dL (ref 6.1–8.1)

## 2018-10-03 LAB — TSH: TSH: 1.66 mIU/L (ref 0.40–4.50)

## 2018-10-03 LAB — MAGNESIUM: Magnesium: 2.3 mg/dL (ref 1.5–2.5)

## 2018-10-19 ENCOUNTER — Other Ambulatory Visit: Payer: Self-pay | Admitting: Internal Medicine

## 2018-10-24 DIAGNOSIS — M47812 Spondylosis without myelopathy or radiculopathy, cervical region: Secondary | ICD-10-CM | POA: Diagnosis not present

## 2018-10-31 ENCOUNTER — Other Ambulatory Visit: Payer: Self-pay | Admitting: Physician Assistant

## 2018-11-22 ENCOUNTER — Telehealth: Payer: Self-pay

## 2018-11-22 ENCOUNTER — Other Ambulatory Visit: Payer: Self-pay

## 2018-11-22 NOTE — Telephone Encounter (Signed)
Patient has been verified and approved for Prolia

## 2018-11-25 ENCOUNTER — Encounter: Payer: Self-pay | Admitting: Internal Medicine

## 2018-11-25 ENCOUNTER — Ambulatory Visit (INDEPENDENT_AMBULATORY_CARE_PROVIDER_SITE_OTHER): Payer: BLUE CROSS/BLUE SHIELD | Admitting: Internal Medicine

## 2018-11-25 ENCOUNTER — Other Ambulatory Visit: Payer: Self-pay

## 2018-11-25 VITALS — BP 118/80 | HR 86 | Ht 61.0 in | Wt 103.0 lb

## 2018-11-25 DIAGNOSIS — E559 Vitamin D deficiency, unspecified: Secondary | ICD-10-CM | POA: Diagnosis not present

## 2018-11-25 DIAGNOSIS — M810 Age-related osteoporosis without current pathological fracture: Secondary | ICD-10-CM | POA: Diagnosis not present

## 2018-11-25 LAB — ALKALINE PHOSPHATASE: Alkaline Phosphatase: 22 U/L — ABNORMAL LOW (ref 39–117)

## 2018-11-25 MED ORDER — DENOSUMAB 60 MG/ML ~~LOC~~ SOSY
60.0000 mg | PREFILLED_SYRINGE | Freq: Once | SUBCUTANEOUS | Status: AC
  Administered 2018-11-25: 60 mg via SUBCUTANEOUS

## 2018-11-25 NOTE — Patient Instructions (Signed)
Please stop at the lab.  Please continue 6000 units vitamin D daily.  Please come back for a follow-up appointment in 1 year.

## 2018-11-25 NOTE — Progress Notes (Signed)
Per orders of Dr. Gherghe injection of Prolia given today by Janeth Terry, Certified Medical Assistant . Patient tolerated injection well.  
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
## 2018-11-26 ENCOUNTER — Encounter: Payer: Self-pay | Admitting: Internal Medicine

## 2018-11-26 ENCOUNTER — Other Ambulatory Visit: Payer: Self-pay | Admitting: Internal Medicine

## 2018-11-26 DIAGNOSIS — E559 Vitamin D deficiency, unspecified: Secondary | ICD-10-CM

## 2018-11-26 LAB — VITAMIN D 25 HYDROXY (VIT D DEFICIENCY, FRACTURES): VITD: 113.35 ng/mL (ref 30.00–100.00)

## 2018-11-26 NOTE — Telephone Encounter (Signed)
Left voice message for patient to call clinic.  

## 2018-11-26 NOTE — Telephone Encounter (Signed)
Please advise if I should add this pt to your schedule as a MyChart visit or phone visit?

## 2018-12-24 ENCOUNTER — Encounter: Payer: Self-pay | Admitting: Internal Medicine

## 2019-01-10 NOTE — Progress Notes (Signed)
FOLLOW UP  Assessment and Plan:   Hypertension Well controlled with current medications  Monitor blood pressure at home; patient to call if consistently greater than 130/80 Continue DASH diet.   Reminder to go to the ER if any CP, SOB, nausea, dizziness, severe HA, changes vision/speech, left arm numbness and tingling and jaw pain.  Cholesterol Currently at goal; rosuvastatin to 10 mg daily  Continue low cholesterol diet and exercise.  Check lipid panel.   BMi 19 Continue to recommend diet heavy in fruits and veggies and low in animal meats, cheeses, and dairy products, appropriate calorie intake Discuss exercise recommendations routinely Continue to monitor weight at each visit  Vitamin D Def Above goal at recent check by Dr. Cruzita Lederer; reduced dose to 3000 IU  continue supplementation to maintain goal of 60-100 Defer Vit D level as <3 months ago, check next visit   Insomnia - good sleep hygiene discussed, increase day time activity, try melatonin or benadryl  Hasn't tolerated attempts to taper with other medications; continue to encourage lowest dose necessary, uses 1/2 tab only as able a few days a week   GERD Well managed on current medications, omeprazole PRN 3-4 days weekly  Discussed diet, avoiding triggers and other lifestyle changes   Continue diet and meds as discussed. Further disposition pending results of labs. Discussed med's effects and SE's.   Over 30 minutes of exam, counseling, chart review, and critical decision making was performed.   Future Appointments  Date Time Provider [place]  06/09/2019  2:00 PM Liane Comber, NP [facility] None  11/25/2019  2:00 PM Philemon Kingdom, MD [facility] None    ----------------------------------------------------------------------------------------------------------------------  HPI 63 y.o. female  presents for 3 month follow up on hypertension, cholesterol, glucose, weight and vitamin D deficiency.    She has had migraines since she was 63 y/o; she is on amlodipine 5 mg and aimovig injection for migraines. She uses zolmitriptan as abortive which works well for her. Recently improved.   She is prescribed valium 10 mg at night for sleep; she has attempted other medications and tapering off and hasn't done well, takes only 1/2 tab some nights.   she has a diagnosis of GERD which is currently managed by omeprazole 20 mg PRN, estimates 3-4 days/week she reports symptoms is currently well controlled, and denies breakthrough reflux, burning in chest, hoarseness or cough.    BMI is Body mass index is 19.88 kg/m., she has been working on diet and exercise, walks daily with 2 border collies, walks in woods behind her house. She pushes fruits/vegetables, avoids red meat. She admits to poor water intake, drinks 2 glasses a day, drinks lemonade or orange juice.  Wt Readings from Last 3 Encounters:  01/13/19 105 lb 3.2 oz (47.7 kg)  11/25/18 103 lb (46.7 kg)  10/02/18 102 lb (46.3 kg)   Today their BP is BP: 136/82  She does workout. She denies chest pain, shortness of breath, dizziness.   She is on cholesterol medication Rosuvastatin 10 mg daily and denies myalgias. Her LDL cholesterol is at goal; trigs remained elevated at last check. The cholesterol last visit was:   Lab Results  Component Value Date   CHOL 197 10/02/2018   HDL 62 10/02/2018   LDLCALC 99 10/02/2018   TRIG 239 (H) 10/02/2018   CHOLHDL 3.2 10/02/2018    She has been working on diet and exercise for glucose management, and denies increased appetite, nausea, paresthesia of the feet, polydipsia, polyuria, visual disturbances and vomiting.  Last A1C in the office was:  Lab Results  Component Value Date   HGBA1C 5.5 06/04/2018   She is on thyroid medication. Her medication was not changed last visit.   Lab Results  Component Value Date   TSH 1.66 10/02/2018   Patient is on Vitamin D supplement and above goal when checked by  Dr. Cruzita Lederer recently, was advised to reduce dose from 6000 IU to 3000 IU She is getting prolia injections at that office for osteoporosis Lab Results  Component Value Date   VD25OH 113.35 ([place]) 11/25/2018        Current Medications:  Current Outpatient Medications on File Prior to Visit  Medication Sig  . AIMOVIG 140 MG/ML SOAJ   . amLODipine (NORVASC) 5 MG tablet TAKE 1 TABLET BY MOUTH EVERY DAY  . benzonatate (TESSALON) 200 MG capsule Take 1 cap up to 3 times a day as needed for cough.  . Cholecalciferol (VITAMIN D) 2000 units CAPS Take 3,000 capsules by mouth daily.   . diazepam (VALIUM) 10 MG tablet Take 5 mg by mouth at bedtime as needed for anxiety.   . metaxalone (SKELAXIN) 800 MG tablet Take 800 mg by mouth as needed.   . Multiple Vitamins-Minerals (WOMENS MULTI PO) Take by mouth daily.  Marland Kitchen omeprazole (PRILOSEC) 20 MG capsule Take 1-2 times daily as needed for reflux.  . rosuvastatin (CRESTOR) 10 MG tablet Take 1 tablet Daily for Cholesterol  . SYNTHROID 25 MCG tablet TAKE 1 TABLET BY MOUTH DAILY  . zolmitriptan (ZOMIG-ZMT) 2.5 MG disintegrating tablet DIS ONE T PO UTD PRF MIGRAINE  . rosuvastatin (CRESTOR) 20 MG tablet Take 0.5 tablets (10 mg total) by mouth daily.   No current facility-administered medications on file prior to visit.      Allergies:  Allergies  Allergen Reactions  . Aleve [Naproxen Sodium] Rash       . Lipitor [Atorvastatin] Other (See Comments)  . Niacin And Related Anaphylaxis  . Omnaris [Ciclesonide] Other (See Comments)    headache  . Ceftin [Cefuroxime Axetil] Rash  . Other Other (See Comments)  . Levaquin [Levofloxacin In D5w]     Muscle cramps  . Simvastatin   . Zetia [Ezetimibe]      Medical History:  Past Medical History:  Diagnosis Date  . Carpal tunnel syndrome on right   . History of concussion    1972, 1977, 1983  . Hyperlipidemia   . Hypertension   . Migraines    pain clinic [place]  . Osteoporosis    Family history-  Reviewed and unchanged Social history- Reviewed and unchanged   Review of Systems:  Review of Systems  Constitutional: Negative for malaise/fatigue and weight loss.  HENT: Negative for hearing loss and tinnitus.   Eyes: Negative for blurred vision and double vision.  Respiratory: Negative for cough, sputum production, shortness of breath and wheezing.   Cardiovascular: Negative for chest pain, palpitations, orthopnea, claudication and leg swelling.  Gastrointestinal: Negative for abdominal pain, blood in stool, constipation, diarrhea, heartburn, melena, nausea and vomiting.  Genitourinary: Negative.   Musculoskeletal: Negative for joint pain and myalgias.  Skin: Negative for rash.  Neurological: Negative for dizziness, tingling, sensory change, weakness and headaches.  Endo/Heme/Allergies: Negative for environmental allergies and polydipsia.  Psychiatric/Behavioral: Negative for depression, memory loss and substance abuse. The patient has insomnia. The patient is not nervous/anxious.   All other systems reviewed and are negative.    Physical Exam: BP 136/82   Pulse 82   Temp Marland Kitchen)  97.3 F (36.3 C)   Ht 5\' 1"  (1.549 m)   Wt 105 lb 3.2 oz (47.7 kg)   SpO2 98%   BMI 19.88 kg/m  Wt Readings from Last 3 Encounters:  01/13/19 105 lb 3.2 oz (47.7 kg)  11/25/18 103 lb (46.7 kg)  10/02/18 102 lb (46.3 kg)   General Appearance: Well nourished, in no apparent distress. Eyes: PERRLA, EOMs, conjunctiva no swelling or erythema Sinuses: No Frontal/maxillary tenderness ENT/Mouth: Ext aud canals clear, TMs without erythema, bulging. No erythema, swelling, or exudate on post pharynx.  Tonsils not swollen or erythematous. Hearing normal.  Neck: Supple, thyroid normal.  Respiratory: Respiratory effort normal, BS equal bilaterally without rales, rhonchi, wheezing or stridor.  Cardio: RRR with no MRGs. Brisk peripheral pulses without edema.  Abdomen: Soft, + BS.  Non tender, no guarding, rebound,  hernias, masses. Lymphatics: Non tender without lymphadenopathy.  Musculoskeletal: Full ROM, 5/5 strength, Normal gait Skin: Warm, dry without rashes, lesions, ecchymosis.  Neuro: Cranial nerves intact. No cerebellar symptoms.  Psych: Awake and oriented X 3, normal affect, Insight and Judgment appropriate.    Izora Ribas, NP 2:54 PM [facility]

## 2019-01-13 ENCOUNTER — Other Ambulatory Visit: Payer: Self-pay

## 2019-01-13 ENCOUNTER — Ambulatory Visit: Payer: BLUE CROSS/BLUE SHIELD | Admitting: Adult Health

## 2019-01-13 ENCOUNTER — Encounter: Payer: Self-pay | Admitting: Adult Health

## 2019-01-13 VITALS — BP 136/82 | HR 82 | Temp 97.3°F | Ht 61.0 in | Wt 105.2 lb

## 2019-01-13 DIAGNOSIS — I10 Essential (primary) hypertension: Secondary | ICD-10-CM | POA: Diagnosis not present

## 2019-01-13 DIAGNOSIS — Z79899 Other long term (current) drug therapy: Secondary | ICD-10-CM | POA: Diagnosis not present

## 2019-01-13 DIAGNOSIS — K219 Gastro-esophageal reflux disease without esophagitis: Secondary | ICD-10-CM

## 2019-01-13 DIAGNOSIS — M810 Age-related osteoporosis without current pathological fracture: Secondary | ICD-10-CM

## 2019-01-13 DIAGNOSIS — E039 Hypothyroidism, unspecified: Secondary | ICD-10-CM

## 2019-01-13 DIAGNOSIS — E559 Vitamin D deficiency, unspecified: Secondary | ICD-10-CM | POA: Diagnosis not present

## 2019-01-13 DIAGNOSIS — G4700 Insomnia, unspecified: Secondary | ICD-10-CM

## 2019-01-13 DIAGNOSIS — Z681 Body mass index (BMI) 19 or less, adult: Secondary | ICD-10-CM

## 2019-01-13 DIAGNOSIS — E782 Mixed hyperlipidemia: Secondary | ICD-10-CM | POA: Diagnosis not present

## 2019-01-13 NOTE — Patient Instructions (Addendum)
Goals    . DIET - INCREASE WATER INTAKE     Increase fluids aiming for 50-65+ fluid ounces daily.     Marland Kitchen LDL CALC < 100       You are overdue for a mammogram   HOW TO SCHEDULE A MAMMOGRAM  [facility]  7 a.m.-6:30 p.m., Monday 7 a.m.-5 p.m., Tuesday-Friday Schedule an appointment by calling [phone].  [facility] Schedule an appointment by calling [phone].     Aim for 7+ servings of fruits and vegetables daily  65+ fluid ounces of water or unsweet tea for healthy kidneys  Limit to max 1 drink of alcohol per day; avoid smoking/tobacco  Limit animal fats in diet for cholesterol and heart health - choose grass fed whenever available  Avoid highly processed foods, and foods high in saturated/trans fats  Aim for low stress - take time to unwind and care for your mental health  Aim for 150 min of moderate intensity exercise weekly for heart health, and weights twice weekly for bone health  Aim for 7-9 hours of sleep daily

## 2019-01-14 DIAGNOSIS — G8929 Other chronic pain: Secondary | ICD-10-CM | POA: Diagnosis not present

## 2019-01-14 DIAGNOSIS — M47817 Spondylosis without myelopathy or radiculopathy, lumbosacral region: Secondary | ICD-10-CM | POA: Diagnosis not present

## 2019-01-14 LAB — COMPLETE METABOLIC PANEL WITH GFR
AG Ratio: 2.1 (calc) (ref 1.0–2.5)
ALT: 13 U/L (ref 6–29)
AST: 26 U/L (ref 10–35)
Albumin: 4.7 g/dL (ref 3.6–5.1)
Alkaline phosphatase (APISO): 23 U/L — ABNORMAL LOW (ref 37–153)
BUN/Creatinine Ratio: 15 (calc) (ref 6–22)
BUN: 15 mg/dL (ref 7–25)
CO2: 29 mmol/L (ref 20–32)
Calcium: 9.6 mg/dL (ref 8.6–10.4)
Chloride: 105 mmol/L (ref 98–110)
Creat: 1.01 mg/dL — ABNORMAL HIGH (ref 0.50–0.99)
GFR, Est African American: 68 mL/min/{1.73_m2} (ref >=60)
GFR, Est Non African American: 59 mL/min/{1.73_m2} — ABNORMAL LOW (ref >=60)
Globulin: 2.2 g/dL (calc) (ref 1.9–3.7)
Glucose, Bld: 103 mg/dL — ABNORMAL HIGH (ref 65–99)
Potassium: 3.7 mmol/L (ref 3.5–5.3)
Sodium: 142 mmol/L (ref 135–146)
Total Bilirubin: 0.4 mg/dL (ref 0.2–1.2)
Total Protein: 6.9 g/dL (ref 6.1–8.1)

## 2019-01-14 LAB — CBC WITH DIFFERENTIAL/PLATELET
Absolute Monocytes: 764 cells/uL (ref 200–950)
Basophils Absolute: 27 cells/uL (ref 0–200)
Basophils Relative: 0.3 %
Eosinophils Absolute: 82 cells/uL (ref 15–500)
Eosinophils Relative: 0.9 %
HCT: 41 % (ref 35.0–45.0)
Hemoglobin: 13.5 g/dL (ref 11.7–15.5)
Lymphs Abs: 2011 cells/uL (ref 850–3900)
MCH: 28.7 pg (ref 27.0–33.0)
MCHC: 32.9 g/dL (ref 32.0–36.0)
MCV: 87 fL (ref 80.0–100.0)
MPV: 10.4 fL (ref 7.5–12.5)
Monocytes Relative: 8.4 %
Neutro Abs: 6215 cells/uL (ref 1500–7800)
Neutrophils Relative %: 68.3 %
Platelets: 280 10*3/uL (ref 140–400)
RBC: 4.71 10*6/uL (ref 3.80–5.10)
RDW: 13.8 % (ref 11.0–15.0)
Total Lymphocyte: 22.1 %
WBC: 9.1 10*3/uL (ref 3.8–10.8)

## 2019-01-14 LAB — TSH: TSH: 1.47 mIU/L (ref 0.40–4.50)

## 2019-01-14 LAB — LIPID PANEL
Cholesterol: 193 mg/dL (ref <200)
HDL: 66 mg/dL (ref >=50)
LDL Cholesterol (Calc): 108 mg/dL (calc) — ABNORMAL HIGH
Non-HDL Cholesterol (Calc): 127 mg/dL (calc) (ref <130)
Total CHOL/HDL Ratio: 2.9 (calc) (ref <5.0)
Triglycerides: 95 mg/dL (ref <150)

## 2019-01-14 LAB — MAGNESIUM: Magnesium: 2.1 mg/dL (ref 1.5–2.5)

## 2019-01-22 ENCOUNTER — Encounter: Payer: Self-pay | Admitting: Internal Medicine

## 2019-01-22 NOTE — Telephone Encounter (Signed)
Please review and advise.

## 2019-02-25 ENCOUNTER — Encounter: Payer: Self-pay | Admitting: Internal Medicine

## 2019-02-26 NOTE — Telephone Encounter (Signed)
Please review and advise.

## 2019-03-02 ENCOUNTER — Other Ambulatory Visit: Payer: Self-pay | Admitting: Internal Medicine

## 2019-03-02 DIAGNOSIS — E039 Hypothyroidism, unspecified: Secondary | ICD-10-CM

## 2019-03-02 MED ORDER — LEVOTHYROXINE SODIUM 25 MCG PO TABS: ORAL_TABLET | ORAL | 1 refills | Status: DC

## 2019-03-05 ENCOUNTER — Encounter: Payer: Self-pay | Admitting: Internal Medicine

## 2019-03-05 ENCOUNTER — Telehealth: Payer: Self-pay | Admitting: Internal Medicine

## 2019-03-05 NOTE — Telephone Encounter (Signed)
Isabel [name], I cannot send her the picture through mychart, but can you please print and mail her the later DXA scan report - need to open it under radiology images and then send to printer? If not, may need to wait until I come in or maybe ask one of the other dr's how to print the full report, not only the reading.
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
## 2019-03-05 NOTE — Telephone Encounter (Signed)
Please refer to pt request. I am uncertain what image she is referring to that you may have

## 2019-03-06 DIAGNOSIS — G4700 Insomnia, unspecified: Secondary | ICD-10-CM | POA: Diagnosis not present

## 2019-03-06 DIAGNOSIS — G43009 Migraine without aura, not intractable, without status migrainosus: Secondary | ICD-10-CM | POA: Diagnosis not present

## 2019-03-06 NOTE — Telephone Encounter (Signed)
Images and report have been printed and mailed to pt home address

## 2019-03-25 ENCOUNTER — Encounter: Payer: Self-pay | Admitting: Internal Medicine

## 2019-03-26 DIAGNOSIS — M47812 Spondylosis without myelopathy or radiculopathy, cervical region: Secondary | ICD-10-CM | POA: Diagnosis not present

## 2019-03-26 DIAGNOSIS — M47816 Spondylosis without myelopathy or radiculopathy, lumbar region: Secondary | ICD-10-CM | POA: Diagnosis not present

## 2019-03-26 DIAGNOSIS — G43009 Migraine without aura, not intractable, without status migrainosus: Secondary | ICD-10-CM | POA: Diagnosis not present

## 2019-03-26 DIAGNOSIS — M47814 Spondylosis without myelopathy or radiculopathy, thoracic region: Secondary | ICD-10-CM | POA: Diagnosis not present

## 2019-03-31 ENCOUNTER — Other Ambulatory Visit: Payer: Self-pay | Admitting: Internal Medicine

## 2019-03-31 DIAGNOSIS — M818 Other osteoporosis without current pathological fracture: Secondary | ICD-10-CM

## 2019-04-03 ENCOUNTER — Encounter: Payer: Self-pay | Admitting: Adult Health

## 2019-04-03 ENCOUNTER — Ambulatory Visit (INDEPENDENT_AMBULATORY_CARE_PROVIDER_SITE_OTHER): Payer: BLUE CROSS/BLUE SHIELD | Admitting: Adult Health

## 2019-04-03 ENCOUNTER — Other Ambulatory Visit: Payer: Self-pay

## 2019-04-03 VITALS — BP 124/76 | HR 81 | Temp 97.9°F | Ht 61.0 in | Wt 106.0 lb

## 2019-04-03 DIAGNOSIS — K219 Gastro-esophageal reflux disease without esophagitis: Secondary | ICD-10-CM

## 2019-04-03 DIAGNOSIS — I10 Essential (primary) hypertension: Secondary | ICD-10-CM

## 2019-04-03 DIAGNOSIS — Z79899 Other long term (current) drug therapy: Secondary | ICD-10-CM

## 2019-04-03 DIAGNOSIS — G4700 Insomnia, unspecified: Secondary | ICD-10-CM

## 2019-04-03 DIAGNOSIS — G43809 Other migraine, not intractable, without status migrainosus: Secondary | ICD-10-CM | POA: Diagnosis not present

## 2019-04-03 DIAGNOSIS — E039 Hypothyroidism, unspecified: Secondary | ICD-10-CM | POA: Diagnosis not present

## 2019-04-03 DIAGNOSIS — E782 Mixed hyperlipidemia: Secondary | ICD-10-CM

## 2019-04-03 DIAGNOSIS — E559 Vitamin D deficiency, unspecified: Secondary | ICD-10-CM

## 2019-04-03 DIAGNOSIS — Z6820 Body mass index (BMI) 20.0-20.9, adult: Secondary | ICD-10-CM

## 2019-04-03 NOTE — Patient Instructions (Signed)
Goals    . DIET - INCREASE WATER INTAKE     Increase fluids aiming for 55-65+ fluid ounces daily.     . Exercise 150 min/wk Moderate Activity     30 min x 5 days a week    . LDL CALC < 100            When it comes to diets, agreement about the perfect plan isn't easy to find, even among the experts. Experts at the [employer] developed an idea known as the Healthy Eating Plate. Just imagine a plate divided into logical, healthy portions.  The emphasis is on diet quality:  Load up on vegetables and fruits - one-half of your plate: Aim for color and variety, and remember that potatoes don't count.  Go for whole grains - one-quarter of your plate: Whole wheat, barley, wheat berries, quinoa, oats, brown rice, and foods made with them. If you want pasta, go with whole wheat pasta.  Protein power - one-quarter of your plate: Fish, chicken, beans, and nuts are all healthy, versatile protein sources. Limit red meat.  The diet, however, does go beyond the plate, offering a few other suggestions.  Use healthy plant oils, such as olive, canola, soy, corn, sunflower and peanut. Check the labels, and avoid partially hydrogenated oil, which have unhealthy trans fats.  If you're thirsty, drink water. Coffee and tea are good in moderation, but skip sugary drinks and limit milk and dairy products to one or two daily servings.  The type of carbohydrate in the diet is more important than the amount. Some sources of carbohydrates, such as vegetables, fruits, whole grains, and beans-are healthier than others.  Finally, stay active.

## 2019-04-03 NOTE — Progress Notes (Signed)
FOLLOW UP  Assessment and Plan:   Migraines Managed by neuro;  significantly improved with aimovig addition Abortive works well   Hypertension Well controlled with current medications  Monitor blood pressure at home; patient to call if consistently greater than 130/80 Continue DASH diet.   Reminder to go to the ER if any CP, SOB, nausea, dizziness, severe HA, changes vision/speech, left arm numbness and tingling and jaw pain.  Cholesterol Currently mildly above goal; currently taking rosuvastatin to 10 mg daily  LDL goal <100; if remains above goal this visit plan to increase to 20 mg daily  Continue low cholesterol diet and exercise.  Check lipid panel.   BMi 20 Continue to recommend diet heavy in fruits and veggies and low in animal meats, cheeses, and dairy products, appropriate calorie intake Discuss exercise recommendations routinely Continue to monitor weight at each visit  Vitamin D Def Above goal at recent check by Dr. Cruzita Lederer; reduced dose to 3000 IU  continue supplementation to maintain goal of 60-100 Defer Vit D level, check next visit if Dr. Cruzita Lederer hasn't checked at that point  Insomnia - good sleep hygiene discussed, increase day time activity, try melatonin or benadryl  Hasn't tolerated attempts to taper with other medications; continue to encourage lowest dose necessary, uses 1/2 tab only as able a few days a week   GERD Well managed on current medications, omeprazole PRN 2-4 days weekly  Discussed diet, avoiding triggers and other lifestyle changes   Continue diet and meds as discussed. Further disposition pending results of labs. Discussed med's effects and SE's.   Over 30 minutes of exam, counseling, chart review, and critical decision making was performed.   Future Appointments  Date Time Provider [place]  07/15/2019  3:00 PM Liane Comber, NP [facility] None  11/25/2019  2:00 PM Philemon Kingdom, MD [facility] None     ----------------------------------------------------------------------------------------------------------------------  HPI 63 y.o. female  presents for 3 month follow up on hypertension, cholesterol, glucose, weight and vitamin D deficiency.   She has had migraines since she was 63 y/o; she is on amlodipine 5 mg and aimovig injection for migraines. She uses zolmitriptan as abortive which works well for her. Recently improved. She follows with Dr. Sima Matas; she reports 2 migraines in the last 3 months.   She is prescribed valium 10 mg at night for sleep; she has attempted other medications and tapering off and hasn't done well, takes only 1/2 tab some nights.   She is seeing a new provider at [facility] for chronic back pain, getting injections which have been helpful.   she has a diagnosis of GERD which is currently managed by omeprazole 20 mg PRN, estimates 2 days/week she reports symptoms is currently well controlled, and denies breakthrough reflux, burning in chest, hoarseness or cough.    BMI is Body mass index is 20.03 kg/m., she has been working on diet and exercise, walks daily with 2 border collies, walks in woods behind her house. She pushes fruits/vegetables, avoids red meat. She admits to poor water intake, drinks 2 glasses a day, drinks lemonade or orange juice.  Wt Readings from Last 3 Encounters:  04/03/19 106 lb (48.1 kg)  01/13/19 105 lb 3.2 oz (47.7 kg)  11/25/18 103 lb (46.7 kg)   Today their BP is BP: 124/76  She does workout. She denies chest pain, shortness of breath, dizziness.   She is on cholesterol medication Rosuvastatin 10 mg daily and denies myalgias. Her LDL cholesterol is at goal; trigs remained  elevated at last check. The cholesterol last visit was:   Lab Results  Component Value Date   CHOL 193 01/13/2019   HDL 66 01/13/2019   LDLCALC 108 (H) 01/13/2019   TRIG 95 01/13/2019   CHOLHDL 2.9 01/13/2019    She has been working on diet and exercise for glucose  management, and denies increased appetite, nausea, paresthesia of the feet, polydipsia, polyuria, visual disturbances and vomiting. Last A1C in the office was:  Lab Results  Component Value Date   HGBA1C 5.5 06/04/2018   She is on thyroid medication. Her medication was not changed last visit.   Lab Results  Component Value Date   TSH 1.47 01/13/2019   Patient is on Vitamin D supplement and above goal when checked by Dr. Cruzita Lederer recently, was advised to reduce dose from 6000 IU to 3000 IU She is getting prolia injections at that office for osteoporosis Lab Results  Component Value Date   VD25OH 113.35 ([place]) 11/25/2018        Current Medications:  Current Outpatient Medications on File Prior to Visit  Medication Sig  . AIMOVIG 140 MG/ML SOAJ   . amLODipine (NORVASC) 5 MG tablet TAKE 1 TABLET BY MOUTH EVERY DAY  . Cholecalciferol (VITAMIN D) 2000 units CAPS Take 3,000 Units by mouth daily.  . diazepam (VALIUM) 10 MG tablet Take 5 mg by mouth at bedtime as needed for anxiety.   Marland Kitchen levothyroxine (SYNTHROID) 25 MCG tablet Take 1 tablet daily on an empty stomach with only water for 30 minutes & no Antacid meds, Calcium or Magnesium for 4 hours & avoid Biotin  . metaxalone (SKELAXIN) 800 MG tablet Take 800 mg by mouth as needed.   . Multiple Vitamins-Minerals (WOMENS MULTI PO) Take by mouth daily.  Marland Kitchen omeprazole (PRILOSEC) 20 MG capsule Take 1-2 times daily as needed for reflux.  . rosuvastatin (CRESTOR) 10 MG tablet Take 1 tablet Daily for Cholesterol  . zolmitriptan (ZOMIG-ZMT) 2.5 MG disintegrating tablet DIS ONE T PO UTD PRF MIGRAINE  . benzonatate (TESSALON) 200 MG capsule Take 1 cap up to 3 times a day as needed for cough.   No current facility-administered medications on file prior to visit.      Allergies:  Allergies  Allergen Reactions  . Aleve [Naproxen Sodium] Rash       . Lipitor [Atorvastatin] Other (See Comments)  . Niacin And Related Anaphylaxis  . Omnaris  [Ciclesonide] Other (See Comments)    headache  . Ceftin [Cefuroxime Axetil] Rash  . Other Other (See Comments)  . Levaquin [Levofloxacin In D5w]     Muscle cramps  . Simvastatin   . Zetia [Ezetimibe]      Medical History:  Past Medical History:  Diagnosis Date  . Carpal tunnel syndrome on right   . History of concussion    1972, 1977, 1983  . Hyperlipidemia   . Hypertension   . Migraines    pain clinic [place]  . Osteoporosis    Family history- Reviewed and unchanged Social history- Reviewed and unchanged   Review of Systems:  Review of Systems  Constitutional: Negative for malaise/fatigue and weight loss.  HENT: Negative for hearing loss and tinnitus.   Eyes: Negative for blurred vision and double vision.  Respiratory: Negative for cough, sputum production, shortness of breath and wheezing.   Cardiovascular: Negative for chest pain, palpitations, orthopnea, claudication and leg swelling.  Gastrointestinal: Negative for abdominal pain, blood in stool, constipation, diarrhea, heartburn, melena, nausea and vomiting.  Genitourinary:  Negative.   Musculoskeletal: Positive for back pain (chronic). Negative for joint pain and myalgias.  Skin: Negative for rash.  Neurological: Positive for headaches (migraines; recently improved). Negative for dizziness, tingling, sensory change and weakness.  Endo/Heme/Allergies: Negative for environmental allergies and polydipsia.  Psychiatric/Behavioral: Negative for depression, memory loss and substance abuse. The patient has insomnia. The patient is not nervous/anxious.   All other systems reviewed and are negative.    Physical Exam: BP 124/76   Pulse 81   Temp 97.9 F (36.6 C)   Ht 5\' 1"  (1.549 m)   Wt 106 lb (48.1 kg)   SpO2 99%   BMI 20.03 kg/m  Wt Readings from Last 3 Encounters:  04/03/19 106 lb (48.1 kg)  01/13/19 105 lb 3.2 oz (47.7 kg)  11/25/18 103 lb (46.7 kg)   General Appearance: Well nourished, in no apparent  distress. Eyes: PERRLA, EOMs, conjunctiva no swelling or erythema Sinuses: No Frontal/maxillary tenderness ENT/Mouth: Ext aud canals clear, TMs without erythema, bulging. No erythema, swelling, or exudate on post pharynx.  Tonsils not swollen or erythematous. Hearing normal.  Neck: Supple, thyroid normal.  Respiratory: Respiratory effort normal, BS equal bilaterally without rales, rhonchi, wheezing or stridor.  Cardio: RRR with no MRGs. Brisk peripheral pulses without edema.  Abdomen: Soft, + BS.  Non tender, no guarding, rebound, hernias, masses. Lymphatics: Non tender without lymphadenopathy.  Musculoskeletal: No obvious deformity, Full ROM, 5/5 strength, Normal gait Skin: Warm, dry without rashes, lesions, ecchymosis.  Neuro: Cranial nerves intact. No cerebellar symptoms.  Psych: Awake and oriented X 3, normal affect, Insight and Judgment appropriate.    Izora Ribas, NP 4:13 PM [facility]

## 2019-04-04 ENCOUNTER — Other Ambulatory Visit: Payer: Self-pay | Admitting: Adult Health

## 2019-04-04 LAB — COMPLETE METABOLIC PANEL WITH GFR
AG Ratio: 2.2 (calc) (ref 1.0–2.5)
ALT: 11 U/L (ref 6–29)
AST: 24 U/L (ref 10–35)
Albumin: 4.7 g/dL (ref 3.6–5.1)
Alkaline phosphatase (APISO): 20 U/L — ABNORMAL LOW (ref 37–153)
BUN: 25 mg/dL (ref 7–25)
CO2: 29 mmol/L (ref 20–32)
Calcium: 9.9 mg/dL (ref 8.6–10.4)
Chloride: 103 mmol/L (ref 98–110)
Creat: 0.9 mg/dL (ref 0.50–0.99)
GFR, Est African American: 78 mL/min/{1.73_m2} (ref >=60)
GFR, Est Non African American: 68 mL/min/{1.73_m2} (ref >=60)
Globulin: 2.1 g/dL (calc) (ref 1.9–3.7)
Glucose, Bld: 81 mg/dL (ref 65–99)
Potassium: 4.8 mmol/L (ref 3.5–5.3)
Sodium: 140 mmol/L (ref 135–146)
Total Bilirubin: 0.4 mg/dL (ref 0.2–1.2)
Total Protein: 6.8 g/dL (ref 6.1–8.1)

## 2019-04-04 LAB — CBC WITH DIFFERENTIAL/PLATELET
Absolute Monocytes: 746 cells/uL (ref 200–950)
Basophils Absolute: 49 cells/uL (ref 0–200)
Basophils Relative: 0.6 %
Eosinophils Absolute: 66 cells/uL (ref 15–500)
Eosinophils Relative: 0.8 %
HCT: 40.2 % (ref 35.0–45.0)
Hemoglobin: 13.2 g/dL (ref 11.7–15.5)
Lymphs Abs: 2312 cells/uL (ref 850–3900)
MCH: 28.9 pg (ref 27.0–33.0)
MCHC: 32.8 g/dL (ref 32.0–36.0)
MCV: 88.2 fL (ref 80.0–100.0)
MPV: 10.5 fL (ref 7.5–12.5)
Monocytes Relative: 9.1 %
Neutro Abs: 5027 cells/uL (ref 1500–7800)
Neutrophils Relative %: 61.3 %
Platelets: 284 10*3/uL (ref 140–400)
RBC: 4.56 10*6/uL (ref 3.80–5.10)
RDW: 13.2 % (ref 11.0–15.0)
Total Lymphocyte: 28.2 %
WBC: 8.2 10*3/uL (ref 3.8–10.8)

## 2019-04-04 LAB — MAGNESIUM: Magnesium: 2 mg/dL (ref 1.5–2.5)

## 2019-04-04 LAB — LIPID PANEL
Cholesterol: 185 mg/dL (ref <200)
HDL: 59 mg/dL (ref >=50)
LDL Cholesterol (Calc): 105 mg/dL (calc) — ABNORMAL HIGH
Non-HDL Cholesterol (Calc): 126 mg/dL (calc) (ref <130)
Total CHOL/HDL Ratio: 3.1 (calc) (ref <5.0)
Triglycerides: 113 mg/dL (ref <150)

## 2019-04-04 LAB — TSH: TSH: 1.08 mIU/L (ref 0.40–4.50)

## 2019-04-04 MED ORDER — ROSUVASTATIN CALCIUM 20 MG PO TABS: ORAL_TABLET | ORAL | 1 refills | Status: DC

## 2019-04-15 ENCOUNTER — Encounter: Payer: Self-pay | Admitting: Internal Medicine

## 2019-04-15 ENCOUNTER — Other Ambulatory Visit: Payer: Self-pay | Admitting: Internal Medicine

## 2019-04-15 ENCOUNTER — Ambulatory Visit: Payer: BLUE CROSS/BLUE SHIELD | Admitting: Adult Health

## 2019-04-15 DIAGNOSIS — E782 Mixed hyperlipidemia: Secondary | ICD-10-CM

## 2019-04-15 MED ORDER — ROSUVASTATIN CALCIUM 20 MG PO TABS: ORAL_TABLET | ORAL | 1 refills | Status: DC

## 2019-04-21 NOTE — Telephone Encounter (Signed)
Prolia portal submission complete Awaiting SOB

## 2019-04-30 ENCOUNTER — Other Ambulatory Visit: Payer: Self-pay | Admitting: Adult Health

## 2019-05-05 ENCOUNTER — Encounter: Payer: Self-pay | Admitting: Internal Medicine

## 2019-05-09 ENCOUNTER — Ambulatory Visit: Payer: BLUE CROSS/BLUE SHIELD | Admitting: Adult Health

## 2019-05-09 ENCOUNTER — Other Ambulatory Visit: Payer: Self-pay | Admitting: Internal Medicine

## 2019-05-09 ENCOUNTER — Other Ambulatory Visit: Payer: Self-pay

## 2019-05-09 ENCOUNTER — Encounter: Payer: Self-pay | Admitting: Internal Medicine

## 2019-05-09 ENCOUNTER — Ambulatory Visit (INDEPENDENT_AMBULATORY_CARE_PROVIDER_SITE_OTHER): Payer: BLUE CROSS/BLUE SHIELD | Admitting: Internal Medicine

## 2019-05-09 VITALS — BP 110/72 | HR 71 | Temp 96.3°F | Ht 61.0 in | Wt 106.0 lb

## 2019-05-09 DIAGNOSIS — L820 Inflamed seborrheic keratosis: Secondary | ICD-10-CM

## 2019-05-09 DIAGNOSIS — R059 Cough, unspecified: Secondary | ICD-10-CM

## 2019-05-09 DIAGNOSIS — T7840XS Allergy, unspecified, sequela: Secondary | ICD-10-CM

## 2019-05-09 DIAGNOSIS — R05 Cough: Secondary | ICD-10-CM

## 2019-05-11 ENCOUNTER — Encounter: Payer: Self-pay | Admitting: Internal Medicine

## 2019-05-11 NOTE — Progress Notes (Signed)
Subjective:    Patient ID: Isabel [name], female    DOB: [birth year], 63 y.o.   MRN: [id number]  HPI  This very nice 63 yo [name] presents with a several day hx/o of allergy sx's with sinus congestion, itchy watery eyes & nose and occasional sneezing. No fever, chills, sweats, rash, sputum pdn or SOB. She also has concerns re: 2 pruritic pigmented skin lesions of her upper back.   Outpatient Medications Prior to Visit  Medication Sig Dispense Refill  . AIMOVIG 140 MG/ML SOAJ   2  . amLODipine (NORVASC) 5 MG tablet TAKE 1 TABLET BY MOUTH EVERY DAY 90 tablet 1  . Cholecalciferol (VITAMIN D) 2000 units CAPS Take 3,000 Units by mouth daily.    . diazepam (VALIUM) 10 MG tablet Take 5 mg by mouth at bedtime as needed for anxiety.     Marland Kitchen levothyroxine (SYNTHROID) 25 MCG tablet Take 1 tablet daily on an empty stomach with only water for 30 minutes & no Antacid meds, Calcium or Magnesium for 4 hours & avoid Biotin 90 tablet 1  . metaxalone (SKELAXIN) 800 MG tablet Take 800 mg by mouth as needed.     . Multiple Vitamins-Minerals (WOMENS MULTI PO) Take by mouth daily.    Marland Kitchen omeprazole (PRILOSEC) 20 MG capsule Take 1-2 times daily as needed for reflux. 90 capsule 1  . rosuvastatin (CRESTOR) 20 MG tablet Take 1 tablet Daily for Cholesterol 90 tablet 1  . zolmitriptan (ZOMIG-ZMT) 2.5 MG disintegrating tablet DIS ONE T PO UTD PRF MIGRAINE  5   No facility-administered medications prior to visit.    Allergies  Allergen Reactions  . Aleve [Naproxen Sodium] Rash       . Lipitor [Atorvastatin] Other (See Comments)  . Niacin And Related Anaphylaxis  . Omnaris [Ciclesonide] Other (See Comments)    headache  . Ceftin [Cefuroxime Axetil] Rash  . Other Other (See Comments)  . Levaquin [Levofloxacin In D5w]     Muscle cramps  . Simvastatin   . Zetia [Ezetimibe]    Past Medical History:  Diagnosis Date  . Carpal tunnel syndrome on right   . History of concussion    1972, 1977, 1983  . Hyperlipidemia    . Hypertension   . Migraines    pain clinic [place]  . Osteoporosis    Review of Systems    10 point systems review negative except as above.    Objective:   Physical Exam  BP 110/72   Pulse 71   Temp (!) 96.3 F (35.7 C)   Ht 5\' 1"  (1.549 m)   Wt 106 lb (48.1 kg)   SpO2 98%   BMI 20.03 kg/m   HEENT - WNL. Neck - supple.  Chest - Clear equal BS. Cor - Nl HS. RRR w/o sig MGR. PP 1(+). No edema. MS- FROM w/o deformities.  Gait Nl. Neuro -  Nl w/o focal abnormalities. Skin - there are 2 medium brown pigmented sl raised lesions (1) 8 mm x 10 mm Middle upper back and (2) 4 mm x 8 mm Left posterior shoulder  Procedure CPT : 17000 and 17003 x #1)     After informed consent & aseptic prep with alcohol, the lesions were anesthetized with 1/2  ml each of 0.5% Marcaine sq & Intradermal. Then each lesion was sharply excised with a # 10 scalpel by shave technique and sent for path analysis. Then the lesion wound bases were deeply hyfrecated to destroy any remnant lesion tissue. Antibiotic  ung was applied & then covered with 2" x 32" Tegaderm dressings. Patient was instructed in post-op wound care.    Assessment & Plan:   1. Cough  2. Allergy  - Advised in choices of OTC allergy meds  3. Seborrheic keratoses, inflamed  - Dermatology pathology - Dermatology pathology

## 2019-05-12 ENCOUNTER — Telehealth: Payer: Self-pay

## 2019-05-12 NOTE — Telephone Encounter (Signed)
TC from [facility] regarding this patient, they received a fax for her Prolia but said it was not filled out, or signed and they also need the DEXA scans.

## 2019-05-13 ENCOUNTER — Telehealth: Payer: Self-pay | Admitting: *Deleted

## 2019-05-13 NOTE — Telephone Encounter (Signed)
Patient called and asked if she should remove the tegaderm from her mole removal site on 05/16/2019, which is 1 week from the removal date.  Per Dr Melford Aase, the patient was advised it is OK to move the tegaderm, gently wash the area with soap and water and apply a bandaid. A message was left to inform the patient.

## 2019-05-15 NOTE — Telephone Encounter (Signed)
Form has been completed and awaiting signature

## 2019-05-15 NOTE — Telephone Encounter (Signed)
[facility] called again regarding this patient, they received a fax for her Prolia but said it was not filled out, or signed and they also need the DEXA scans

## 2019-05-21 ENCOUNTER — Encounter: Payer: Self-pay | Admitting: Internal Medicine

## 2019-05-21 NOTE — Telephone Encounter (Signed)
This was refaxed-now awaiting determination

## 2019-05-29 ENCOUNTER — Ambulatory Visit: Payer: BLUE CROSS/BLUE SHIELD

## 2019-06-04 ENCOUNTER — Ambulatory Visit: Payer: BLUE CROSS/BLUE SHIELD

## 2019-06-04 ENCOUNTER — Other Ambulatory Visit: Payer: Self-pay

## 2019-06-04 NOTE — Telephone Encounter (Signed)
Left VM requesting patient call back to schedule Prolia injection-she owes $0 and is ready to be scheduled

## 2019-06-04 NOTE — Telephone Encounter (Signed)
Patient is scheduled for injection 06/11/19 2:30pm

## 2019-06-06 DIAGNOSIS — M47814 Spondylosis without myelopathy or radiculopathy, thoracic region: Secondary | ICD-10-CM | POA: Diagnosis not present

## 2019-06-06 DIAGNOSIS — G8929 Other chronic pain: Secondary | ICD-10-CM | POA: Diagnosis not present

## 2019-06-09 ENCOUNTER — Encounter: Payer: Self-pay | Admitting: Adult Health

## 2019-06-11 ENCOUNTER — Ambulatory Visit (INDEPENDENT_AMBULATORY_CARE_PROVIDER_SITE_OTHER): Payer: BLUE CROSS/BLUE SHIELD

## 2019-06-11 ENCOUNTER — Other Ambulatory Visit: Payer: Self-pay

## 2019-06-11 DIAGNOSIS — M818 Other osteoporosis without current pathological fracture: Secondary | ICD-10-CM

## 2019-06-11 MED ORDER — DENOSUMAB 60 MG/ML ~~LOC~~ SOSY
60.0000 mg | PREFILLED_SYRINGE | Freq: Once | SUBCUTANEOUS | Status: AC
  Administered 2019-06-11: 60 mg via SUBCUTANEOUS

## 2019-06-11 NOTE — Progress Notes (Signed)
Per orders of Dr. Gherghe injection of Prolia given today by Melissa, Certified Medical Assistant . Patient tolerated injection well.  

## 2019-06-12 NOTE — Telephone Encounter (Signed)
Injection given on 06/11/19

## 2019-07-01 DIAGNOSIS — M810 Age-related osteoporosis without current pathological fracture: Secondary | ICD-10-CM | POA: Diagnosis not present

## 2019-07-03 ENCOUNTER — Encounter: Payer: Self-pay | Admitting: Internal Medicine

## 2019-07-15 ENCOUNTER — Encounter: Payer: Self-pay | Admitting: Internal Medicine

## 2019-07-15 ENCOUNTER — Encounter: Payer: Self-pay | Admitting: Adult Health

## 2019-07-15 DIAGNOSIS — M47816 Spondylosis without myelopathy or radiculopathy, lumbar region: Secondary | ICD-10-CM | POA: Diagnosis not present

## 2019-07-15 DIAGNOSIS — M542 Cervicalgia: Secondary | ICD-10-CM | POA: Diagnosis not present

## 2019-07-15 DIAGNOSIS — M545 Low back pain: Secondary | ICD-10-CM | POA: Diagnosis not present

## 2019-07-15 DIAGNOSIS — G8929 Other chronic pain: Secondary | ICD-10-CM | POA: Diagnosis not present

## 2019-07-15 DIAGNOSIS — M47812 Spondylosis without myelopathy or radiculopathy, cervical region: Secondary | ICD-10-CM | POA: Diagnosis not present

## 2019-07-17 ENCOUNTER — Encounter: Payer: Self-pay | Admitting: Internal Medicine

## 2019-07-18 NOTE — Progress Notes (Addendum)
Complete Physical  Assessment and Plan:  Neviyah was seen today for annual exam.  Diagnoses and all orders for this visit:  Encounter for annual physical exam Patient states will schedule GYN follow up for PAP, declines today She will schedule mammogram - number given to schedule  Other osteoporosis without current pathological fracture -     VITAMIN D 25 Hydroxy (Vit-D Deficiency, Fractures) -     CBC with Differential/Platelet She is receiving Prolia Q73months via Dr. Cruzita Lederer and is taking Calcium and Vit D.  Hypothyroidism, unspecified type -     TSH continue medications the same, reminded to take on an empty stomach 30-49mins before food.  Will contact with results  Mixed hyperlipidemia -     Lipid panel Taking Crestor  decrease fatty foods increase activity.  Continue OTC Fish Oil  Other migraine without status migrainosus, not intractable Managed by neurology  She is receiving monthly Aimovig injections with good results Has Zolitriptan PRN  Hx of Prediabetes/screening diabetes -     Hemoglobin A1c  Vitamin D deficiency -     VITAMIN D 25 Hydroxy (Vit-D Deficiency, Fractures) Taking daily supplimentation, continue  Gastroesophageal reflux disease, esophagitis presence not specified Well managed on current medications Discussed diet, avoiding triggers and other lifestyle changes  Elevated BP without diagnosis of hypertension - continue medications, DASH diet, exercise and monitor at home. Call if greater than 130/80.  Patient to monitor at home and contact office if elevation is consistent        - EKG . RBBB on EKG Newly progressed from hx of IRBBB; no sx, no syncope Low risk for pulm etiology or OSA; Will monitor; refer to cardiology if any concerns  Medication management        -     Magnesium  -     TSH -     COMPLETE METABOLIC PANEL WITH GFR -     Urinalysis w microscopic + reflex cultur   Discussed med's effects and SE's. Screening labs and tests  as requested with regular follow-up as recommended. Over 40 minutes of exam, counseling, chart review, and complex, high level critical decision making was performed this visit.   Future Appointments  Date Time Provider [place]  11/25/2019  2:00 PM Philemon Kingdom, MD [id number] None  07/21/2020  3:00 PM Liane Comber, NP [facility] None     HPI  63 y.o. female  presents for a complete physical and follow up for has Recurrent acute sinusitis; Hypertension; Hyperlipidemia; GERD (gastroesophageal reflux disease); Cervical spondylosis without myelopathy; Headache, migraine; History of prediabetes; Vitamin D deficiency; Medication management; Age-related osteoporosis without current pathological fracture; Hypothyroidism; and Insomnia on their problem list.   She is married, no children, has 2 cats, 2 dogs, retired.   She follows with GYN every 2-3 years for PAP smears, Dr. Alan Ripper office, is due to schedule.   She has had migraines since she was 63 y/o; she is on amlodipine 5 mg and aimovig injection for migraines. She uses zolmitriptan as abortive which works well for her. Recently improved.  She follows with Dr. Sima Matas; she reports 1 migraine in the last 3 months.   She is prescribed valium 10 mg at night for sleep; she has attempted other medications and tapering off and hasn't done well, takes only 1/2 tab some nights.   She is followed by [facility] for chronic back pain, getting injections which have been helpful.   she has a diagnosis of GERD which is currently managed  by omeprazole 20 mg PRN, estimates 2 days/week. she reports symptoms is currently well controlled, and denies breakthrough reflux, burning in chest, hoarseness or cough.    She follows with Dr. Cruzita Lederer for prolia injections, last DEXA 2019 lumbar T -3.2  BMI is Body mass index is 19.78 kg/m., she has been working on diet and exercise, new puppy and walking more with her though limited by lower back pain.  She  admits to eating too much sugar.  Wt Readings from Last 3 Encounters:  07/21/19 103 lb (46.7 kg)  05/09/19 106 lb (48.1 kg)  04/03/19 106 lb (48.1 kg)   Her blood pressure has been controlled at home, today their BP is BP: 122/76 She does workout in the form of walking. She denies chest pain, shortness of breath, dizziness.   She is on cholesterol medication (rosuvastatin 20 mg daily)  and denies myalgias. Her cholesterol is not at goal. The cholesterol last visit was:   Lab Results  Component Value Date   CHOL 185 04/03/2019   HDL 59 04/03/2019   LDLCALC 105 (H) 04/03/2019   TRIG 113 04/03/2019   CHOLHDL 3.1 04/03/2019   She has been working on diet and exercise for hx of prediabetes, she is not on bASA, she is not on ACE/ARB and denies nausea, polydipsia and polyuria. Last A1C in the office was:  Lab Results  Component Value Date   HGBA1C 5.5 06/04/2018   She is on thyroid medication. Her medication was not changed last visit.   Lab Results  Component Value Date   TSH 1.08 04/03/2019   Last GFR: Lab Results  Component Value Date   GFRNONAA 68 04/03/2019   Patient is on Vitamin D supplement, now taking ? 4000 IU    Lab Results  Component Value Date   VD25OH 113.35 (HH) 11/25/2018         Current Medications:  Current Outpatient Medications on File Prior to Visit  Medication Sig Dispense Refill  . AIMOVIG 140 MG/ML SOAJ   2  . amLODipine (NORVASC) 5 MG tablet TAKE 1 TABLET BY MOUTH EVERY DAY 90 tablet 1  . Cholecalciferol (VITAMIN D) 2000 units CAPS Take 3,000 Units by mouth daily.    Marland Kitchen denosumab (PROLIA) 60 MG/ML SOSY injection Inject 60 mg into the skin every 6 (six) months.    . diazepam (VALIUM) 10 MG tablet Take 5 mg by mouth at bedtime as needed for anxiety.     Marland Kitchen levothyroxine (SYNTHROID) 25 MCG tablet Take 1 tablet daily on an empty stomach with only water for 30 minutes & no Antacid meds, Calcium or Magnesium for 4 hours & avoid Biotin 90 tablet 1  .  metaxalone (SKELAXIN) 800 MG tablet Take 800 mg by mouth as needed.     . Multiple Vitamins-Minerals (WOMENS MULTI PO) Take by mouth daily.    Marland Kitchen omeprazole (PRILOSEC) 20 MG capsule Take 1-2 times daily as needed for reflux. 90 capsule 1  . rosuvastatin (CRESTOR) 20 MG tablet Take 1 tablet Daily for Cholesterol 90 tablet 1  . zolmitriptan (ZOMIG-ZMT) 2.5 MG disintegrating tablet DIS ONE T PO UTD PRF MIGRAINE  5   No current facility-administered medications on file prior to visit.   Allergies:  Allergies  Allergen Reactions  . Aleve [Naproxen Sodium] Rash       . Lipitor [Atorvastatin] Other (See Comments)  . Niacin And Related Anaphylaxis  . Omnaris [Ciclesonide] Other (See Comments)    headache  . Ceftin [Cefuroxime  Axetil] Rash  . Other Other (See Comments)  . Levaquin [Levofloxacin In D5w]     Muscle cramps  . Simvastatin   . Zetia [Ezetimibe]    Medical History:  She has Recurrent acute sinusitis; Hypertension; Hyperlipidemia; GERD (gastroesophageal reflux disease); Cervical spondylosis without myelopathy; Headache, migraine; History of prediabetes; Vitamin D deficiency; Medication management; Age-related osteoporosis without current pathological fracture; Hypothyroidism; and Insomnia on their problem list. Health Maintenance:   Immunization History  Administered Date(s) Administered  . PPD Test 09/08/2013  . Pneumococcal-Unspecified 08/01/2007  . Tdap 07/31/2011  . Zoster 11/21/2016    Tetanus: 2012, Due 2022 Pneumovax: 2009 Flu vaccine: Declined Zostavax: 2018  LMP: Post-Menopausal Pap: 2017, due 2020, patient states will schedule follow up MGM: 2012, Due - patient states will schedule with breast center DEXA: 2019 Colonoscopy: Due 2023  Last Dental Exam: 2019, encouraged to follow up Last Eye Exam:  2019  Patient Care Team: Unk Pinto, MD as PCP - General (Internal Medicine) Ladene Artist, MD as Consulting Physician (Gastroenterology) Fay Records, MD as Consulting Physician (Cardiology) Jessy Oto, MD as Consulting Physician (Orthopedic Surgery) Deneise Lever, MD as Consulting Physician (Pulmonary Disease) Amil Amen, MD as Referring Physician (Neurology) Dorene Ar, MD (Pain Medicine)  Surgical History:  She has a past surgical history that includes abdominal wall tear; Mouth surgery; fractured right wrist; Wrist surgery (Right, 1973); and LEEP. Family History:  Herfamily history includes Alzheimer's disease in her father; Depression in her mother; Hyperlipidemia in her father; Hypertension in her father; Migraines in her brother, mother, and sister; Stroke in her mother. Social History:  She reports that she quit smoking about 25 years ago. She started smoking about 45 years ago. She has a 19.00 pack-year smoking history. She has never used smokeless tobacco. She reports that she does not drink alcohol or use drugs.  Review of Systems: Review of Systems  Constitutional: Negative for chills, diaphoresis, fever, malaise/fatigue and weight loss.  HENT: Negative for congestion, ear discharge, ear pain, hearing loss, nosebleeds, sinus pain, sore throat and tinnitus.   Eyes: Negative for blurred vision, double vision, photophobia, pain, discharge and redness.  Respiratory: Negative for cough (occasional, AM), hemoptysis, sputum production, shortness of breath, wheezing and stridor.   Cardiovascular: Negative for chest pain, palpitations, orthopnea, claudication, leg swelling and PND.  Gastrointestinal: Negative for abdominal pain, blood in stool, constipation, diarrhea, heartburn, melena, nausea and vomiting.  Genitourinary: Negative for dysuria, flank pain, frequency, hematuria and urgency.  Musculoskeletal: Positive for back pain. Negative for falls, joint pain, myalgias and neck pain.  Skin: Negative for itching and rash.  Neurological: Positive for headaches. Negative for dizziness, tingling, tremors, sensory  change, speech change, focal weakness, seizures, loss of consciousness and weakness.  Endo/Heme/Allergies: Negative for environmental allergies and polydipsia. Does not bruise/bleed easily.  Psychiatric/Behavioral: Negative for depression, hallucinations, memory loss, substance abuse and suicidal ideas. The patient is not nervous/anxious and does not have insomnia.     Physical Exam: Estimated body mass index is 19.78 kg/m as calculated from the following:   Height as of this encounter: 5' 0.5" (1.537 m).   Weight as of this encounter: 103 lb (46.7 kg). BP 122/76   Pulse 85   Temp (!) 97.5 F (36.4 C)   Ht 5' 0.5" (1.537 m)   Wt 103 lb (46.7 kg)   SpO2 98%   BMI 19.78 kg/m  General Appearance: Well nourished, in no apparent distress.  Eyes: PERRLA, EOMs, conjunctiva no swelling  or erythema, normal fundi and vessels.  Sinuses: No Frontal/maxillary tenderness  ENT/Mouth: Ext aud canals clear, normal light reflex with TMs without erythema, bulging. Good dentition. No erythema, swelling, or exudate on post pharynx. Tonsils not swollen or erythematous. Hearing normal.  Neck: Supple, thyroid normal. No bruits  Respiratory: Respiratory effort normal, BS equal bilaterally without rales, rhonchi, wheezing or stridor.  Cardio: RRR without murmurs, rubs or gallops. Brisk peripheral pulses without edema.  Chest: symmetric, with normal excursions and percussion.  Breasts: defer to GYN Abdomen: Soft, nontender, no guarding, rebound, hernias, masses, or organomegaly.  Lymphatics: Non tender without lymphadenopathy.  Genitourinary: defer to GYN Musculoskeletal: Full ROM all peripheral extremities,5/5 strength, and normal gait. Pain with palpation to cervical and lumbar spine. Skin: Warm, dry without rashes, lesions, ecchymosis. Neuro: Cranial nerves intact, reflexes equal bilaterally. Normal muscle tone, no cerebellar symptoms. Sensation intact.  Psych: Awake and oriented X 3, normal affect,  Insight and Judgment appropriate.   EKG: Sinus rhythm, RBBB     Izora Ribas, NP 3:21 PM [facility]

## 2019-07-21 ENCOUNTER — Ambulatory Visit (INDEPENDENT_AMBULATORY_CARE_PROVIDER_SITE_OTHER): Payer: BLUE CROSS/BLUE SHIELD | Admitting: Adult Health

## 2019-07-21 ENCOUNTER — Encounter: Payer: Self-pay | Admitting: Adult Health

## 2019-07-21 ENCOUNTER — Other Ambulatory Visit: Payer: Self-pay

## 2019-07-21 VITALS — BP 122/76 | HR 85 | Temp 97.5°F | Ht 60.5 in | Wt 103.0 lb

## 2019-07-21 DIAGNOSIS — Z13220 Encounter for screening for lipoid disorders: Secondary | ICD-10-CM | POA: Diagnosis not present

## 2019-07-21 DIAGNOSIS — Z136 Encounter for screening for cardiovascular disorders: Secondary | ICD-10-CM | POA: Diagnosis not present

## 2019-07-21 DIAGNOSIS — Z87898 Personal history of other specified conditions: Secondary | ICD-10-CM

## 2019-07-21 DIAGNOSIS — Z79899 Other long term (current) drug therapy: Secondary | ICD-10-CM

## 2019-07-21 DIAGNOSIS — I4510 Unspecified right bundle-branch block: Secondary | ICD-10-CM | POA: Insufficient documentation

## 2019-07-21 DIAGNOSIS — Z1329 Encounter for screening for other suspected endocrine disorder: Secondary | ICD-10-CM | POA: Diagnosis not present

## 2019-07-21 DIAGNOSIS — E039 Hypothyroidism, unspecified: Secondary | ICD-10-CM

## 2019-07-21 DIAGNOSIS — Z0000 Encounter for general adult medical examination without abnormal findings: Secondary | ICD-10-CM | POA: Diagnosis not present

## 2019-07-21 DIAGNOSIS — G43809 Other migraine, not intractable, without status migrainosus: Secondary | ICD-10-CM

## 2019-07-21 DIAGNOSIS — Z131 Encounter for screening for diabetes mellitus: Secondary | ICD-10-CM | POA: Diagnosis not present

## 2019-07-21 DIAGNOSIS — Z1389 Encounter for screening for other disorder: Secondary | ICD-10-CM | POA: Diagnosis not present

## 2019-07-21 DIAGNOSIS — E782 Mixed hyperlipidemia: Secondary | ICD-10-CM

## 2019-07-21 DIAGNOSIS — E559 Vitamin D deficiency, unspecified: Secondary | ICD-10-CM

## 2019-07-21 DIAGNOSIS — M47812 Spondylosis without myelopathy or radiculopathy, cervical region: Secondary | ICD-10-CM

## 2019-07-21 DIAGNOSIS — I10 Essential (primary) hypertension: Secondary | ICD-10-CM

## 2019-07-21 DIAGNOSIS — Z0001 Encounter for general adult medical examination with abnormal findings: Secondary | ICD-10-CM

## 2019-07-21 DIAGNOSIS — K219 Gastro-esophageal reflux disease without esophagitis: Secondary | ICD-10-CM

## 2019-07-21 DIAGNOSIS — J0191 Acute recurrent sinusitis, unspecified: Secondary | ICD-10-CM

## 2019-07-21 DIAGNOSIS — G4700 Insomnia, unspecified: Secondary | ICD-10-CM

## 2019-07-21 DIAGNOSIS — M810 Age-related osteoporosis without current pathological fracture: Secondary | ICD-10-CM

## 2019-07-21 NOTE — Patient Instructions (Addendum)
[name] , Thank you for taking time to come for your Annual Wellness Visit. I appreciate your ongoing commitment to your health goals. Please review the following plan we discussed and let me know if I can assist you in the future.   These are the goals we discussed: Goals    . DIET - INCREASE WATER INTAKE     Increase fluids aiming for 55-65+ fluid ounces daily.     . Exercise 150 min/wk Moderate Activity     30 min x 5 days a week    . LDL CALC < 100       This is a list of the screening recommended for you and due dates:  Health Maintenance  Topic Date Due  .  Hepatitis C: One time screening is recommended by Center for Disease Control  (CDC) for  adults born from [birth year] through 1966.   [birth year]  . Mammogram  07/18/2013  . Pap Smear  12/20/2018  . Flu Shot  03/01/2019  . Tetanus Vaccine  07/30/2021  . Colon Cancer Screening  07/03/2022  . HIV Screening  Completed    Please schedule a mammogram prior to your next appointment   [place]  7 a.m.-6:30 p.m., Monday 7 a.m.-5 p.m., Tuesday-Friday Schedule an appointment by calling [phone].  [facility] Schedule an appointment by calling [phone].    Recommend cutting down on sugar intake  - the American Heart Association recommends no more than 9 teaspoons (38 g) of added sugar for men daily, and 6 teaspoons (25 g) for women. Added sugar can be in many things that you might not expect - salad dressings, bread that is not home made, "all natural" fruit juice, etc., most processed foods contain hidden sugars. Consider looking at labels and being aware of how much sugar you are consuming in a day. Less is always better for sugar; sugar reduces your body's immune response, and damages blood vessels, leading to increased risk of many diseases.      Know what a healthy weight is for you (roughly BMI <25) and aim to maintain this  Aim for 7+  servings of fruits and vegetables daily  65-80+ fluid ounces of water or unsweet tea for healthy kidneys  Limit to max 1 drink of alcohol per day; avoid smoking/tobacco  Limit animal fats in diet for cholesterol and heart health - choose grass fed whenever available  Avoid highly processed foods, and foods high in saturated/trans fats  Aim for low stress - take time to unwind and care for your mental health  Aim for 150 min of moderate intensity exercise weekly for heart health, and weights twice weekly for bone health  Aim for 7-9 hours of sleep daily      Health Maintenance, Female Adopting a healthy lifestyle and getting preventive care are important in promoting health and wellness. Ask your health care provider about:  The right schedule for you to have regular tests and exams.  Things you can do on your own to prevent diseases and keep yourself healthy. What should I know about diet, weight, and exercise? Eat a healthy diet   Eat a diet that includes plenty of vegetables, fruits, low-fat dairy products, and lean protein.  Do not eat a lot of foods that are high in solid fats, added sugars, or sodium. Maintain a healthy weight Body mass index (BMI) is used to identify weight problems. It estimates  body fat based on height and weight. Your health care provider can help determine your BMI and help you achieve or maintain a healthy weight. Get regular exercise Get regular exercise. This is one of the most important things you can do for your health. Most adults should:  Exercise for at least 150 minutes each week. The exercise should increase your heart rate and make you sweat (moderate-intensity exercise).  Do strengthening exercises at least twice a week. This is in addition to the moderate-intensity exercise.  Spend less time sitting. Even light physical activity can be beneficial. Watch cholesterol and blood lipids Have your blood tested for lipids and cholesterol at  64 years of age, then have this test every 5 years. Have your cholesterol levels checked more often if:  Your lipid or cholesterol levels are high.  You are older than 64 years of age.  You are at high risk for heart disease. What should I know about cancer screening? Depending on your health history and family history, you may need to have cancer screening at various ages. This may include screening for:  Breast cancer.  Cervical cancer.  Colorectal cancer.  Skin cancer.  Lung cancer. What should I know about heart disease, diabetes, and high blood pressure? Blood pressure and heart disease  High blood pressure causes heart disease and increases the risk of stroke. This is more likely to develop in people who have high blood pressure readings, are of African descent, or are overweight.  Have your blood pressure checked: ? Every 3-5 years if you are 4-72 years of age. ? Every year if you are 70 years old or older. Diabetes Have regular diabetes screenings. This checks your fasting blood sugar level. Have the screening done:  Once every three years after age 89 if you are at a normal weight and have a low risk for diabetes.  More often and at a younger age if you are overweight or have a high risk for diabetes. What should I know about preventing infection? Hepatitis B If you have a higher risk for hepatitis B, you should be screened for this virus. Talk with your health care provider to find out if you are at risk for hepatitis B infection. Hepatitis C Testing is recommended for:  Everyone born from [birth year] through 1966.  Anyone with known risk factors for hepatitis C. Sexually transmitted infections (STIs)  Get screened for STIs, including gonorrhea and chlamydia, if: ? You are sexually active and are younger than 64 years of age. ? You are older than 64 years of age and your health care provider tells you that you are at risk for this type of infection. ? Your sexual  activity has changed since you were last screened, and you are at increased risk for chlamydia or gonorrhea. Ask your health care provider if you are at risk.  Ask your health care provider about whether you are at high risk for HIV. Your health care provider may recommend a prescription medicine to help prevent HIV infection. If you choose to take medicine to prevent HIV, you should first get tested for HIV. You should then be tested every 3 months for as long as you are taking the medicine. Pregnancy  If you are about to stop having your period (premenopausal) and you may become pregnant, seek counseling before you get pregnant.  Take 400 to 800 micrograms (mcg) of folic acid every day if you become pregnant.  Ask for birth control (contraception) if you want  to prevent pregnancy. Osteoporosis and menopause Osteoporosis is a disease in which the bones lose minerals and strength with aging. This can result in bone fractures. If you are 10 years old or older, or if you are at risk for osteoporosis and fractures, ask your health care provider if you should:  Be screened for bone loss.  Take a calcium or vitamin D supplement to lower your risk of fractures.  Be given hormone replacement therapy (HRT) to treat symptoms of menopause. Follow these instructions at home: Lifestyle  Do not use any products that contain nicotine or tobacco, such as cigarettes, e-cigarettes, and chewing tobacco. If you need help quitting, ask your health care provider.  Do not use street drugs.  Do not share needles.  Ask your health care provider for help if you need support or information about quitting drugs. Alcohol use  Do not drink alcohol if: ? Your health care provider tells you not to drink. ? You are pregnant, may be pregnant, or are planning to become pregnant.  If you drink alcohol: ? Limit how much you use to 0-1 drink a day. ? Limit intake if you are breastfeeding.  Be aware of how much  alcohol is in your drink. In the [place] one drink equals one 12 oz bottle of beer (355 mL), one 5 oz glass of wine (148 mL), or one 1 oz glass of hard liquor (44 mL). General instructions  Schedule regular health, dental, and eye exams.  Stay current with your vaccines.  Tell your health care provider if: ? You often feel depressed. ? You have ever been abused or do not feel safe at home. Summary  Adopting a healthy lifestyle and getting preventive care are important in promoting health and wellness.  Follow your health care provider's instructions about healthy diet, exercising, and getting tested or screened for diseases.  Follow your health care provider's instructions on monitoring your cholesterol and blood pressure. This information is not intended to replace advice given to you by your health care provider. Make sure you discuss any questions you have with your health care provider. Document Released: 01/30/2011 Document Revised: 07/10/2018 Document Reviewed: 07/10/2018 Elsevier Patient Education  2020 [employer].

## 2019-07-22 ENCOUNTER — Encounter: Payer: Self-pay | Admitting: Internal Medicine

## 2019-07-22 LAB — CBC WITH DIFFERENTIAL/PLATELET
Absolute Monocytes: 820 cells/uL (ref 200–950)
Basophils Absolute: 20 cells/uL (ref 0–200)
Basophils Relative: 0.2 %
Eosinophils Absolute: 100 cells/uL (ref 15–500)
Eosinophils Relative: 1 %
HCT: 41.4 % (ref 35.0–45.0)
Hemoglobin: 13.5 g/dL (ref 11.7–15.5)
Lymphs Abs: 2460 cells/uL (ref 850–3900)
MCH: 29 pg (ref 27.0–33.0)
MCHC: 32.6 g/dL (ref 32.0–36.0)
MCV: 88.8 fL (ref 80.0–100.0)
MPV: 10.5 fL (ref 7.5–12.5)
Monocytes Relative: 8.2 %
Neutro Abs: 6600 cells/uL (ref 1500–7800)
Neutrophils Relative %: 66 %
Platelets: 273 10*3/uL (ref 140–400)
RBC: 4.66 10*6/uL (ref 3.80–5.10)
RDW: 12.8 % (ref 11.0–15.0)
Total Lymphocyte: 24.6 %
WBC: 10 10*3/uL (ref 3.8–10.8)

## 2019-07-22 LAB — TSH: TSH: 1.35 mIU/L (ref 0.40–4.50)

## 2019-07-22 LAB — COMPLETE METABOLIC PANEL WITH GFR
AG Ratio: 2 (calc) (ref 1.0–2.5)
ALT: 12 U/L (ref 6–29)
AST: 23 U/L (ref 10–35)
Albumin: 4.5 g/dL (ref 3.6–5.1)
Alkaline phosphatase (APISO): 23 U/L — ABNORMAL LOW (ref 37–153)
BUN: 17 mg/dL (ref 7–25)
CO2: 31 mmol/L (ref 20–32)
Calcium: 9.8 mg/dL (ref 8.6–10.4)
Chloride: 104 mmol/L (ref 98–110)
Creat: 0.92 mg/dL (ref 0.50–0.99)
GFR, Est African American: 76 mL/min/{1.73_m2} (ref >=60)
GFR, Est Non African American: 66 mL/min/{1.73_m2} (ref >=60)
Globulin: 2.3 g/dL (calc) (ref 1.9–3.7)
Glucose, Bld: 85 mg/dL (ref 65–99)
Potassium: 4 mmol/L (ref 3.5–5.3)
Sodium: 141 mmol/L (ref 135–146)
Total Bilirubin: 0.3 mg/dL (ref 0.2–1.2)
Total Protein: 6.8 g/dL (ref 6.1–8.1)

## 2019-07-22 LAB — LIPID PANEL
Cholesterol: 168 mg/dL (ref <200)
HDL: 56 mg/dL (ref >=50)
LDL Cholesterol (Calc): 88 mg/dL (calc)
Non-HDL Cholesterol (Calc): 112 mg/dL (calc) (ref <130)
Total CHOL/HDL Ratio: 3 (calc) (ref <5.0)
Triglycerides: 143 mg/dL (ref <150)

## 2019-07-22 LAB — VITAMIN D 25 HYDROXY (VIT D DEFICIENCY, FRACTURES): Vit D, 25-Hydroxy: 86 ng/mL (ref 30–100)

## 2019-07-22 LAB — URINALYSIS, ROUTINE W REFLEX MICROSCOPIC
Bilirubin Urine: NEGATIVE
Glucose, UA: NEGATIVE
Hgb urine dipstick: NEGATIVE
Ketones, ur: NEGATIVE
Leukocytes,Ua: NEGATIVE
Nitrite: NEGATIVE
Protein, ur: NEGATIVE
Specific Gravity, Urine: 1.016 (ref 1.001–1.03)
pH: 7.5 (ref 5.0–8.0)

## 2019-07-22 LAB — MICROALBUMIN / CREATININE URINE RATIO
Creatinine, Urine: 127 mg/dL (ref 20–275)
Microalb Creat Ratio: 23 mcg/mg creat (ref <30)
Microalb, Ur: 2.9 mg/dL

## 2019-07-22 LAB — HEMOGLOBIN A1C
Hgb A1c MFr Bld: 5.5 % of total Hgb (ref <5.7)
Mean Plasma Glucose: 111 (calc)
eAG (mmol/L): 6.2 (calc)

## 2019-07-22 LAB — MAGNESIUM: Magnesium: 2.1 mg/dL (ref 1.5–2.5)

## 2019-09-05 ENCOUNTER — Other Ambulatory Visit: Payer: Self-pay | Admitting: Internal Medicine

## 2019-09-05 DIAGNOSIS — E039 Hypothyroidism, unspecified: Secondary | ICD-10-CM

## 2019-09-05 MED ORDER — LEVOTHYROXINE SODIUM 25 MCG PO TABS: ORAL_TABLET | ORAL | 3 refills | Status: DC

## 2019-09-08 DIAGNOSIS — G43009 Migraine without aura, not intractable, without status migrainosus: Secondary | ICD-10-CM | POA: Diagnosis not present

## 2019-09-08 DIAGNOSIS — G4700 Insomnia, unspecified: Secondary | ICD-10-CM | POA: Diagnosis not present

## 2019-10-02 DIAGNOSIS — M6281 Muscle weakness (generalized): Secondary | ICD-10-CM | POA: Diagnosis not present

## 2019-10-02 DIAGNOSIS — M545 Low back pain: Secondary | ICD-10-CM | POA: Diagnosis not present

## 2019-10-02 DIAGNOSIS — G8929 Other chronic pain: Secondary | ICD-10-CM | POA: Diagnosis not present

## 2019-10-09 DIAGNOSIS — M545 Low back pain: Secondary | ICD-10-CM | POA: Diagnosis not present

## 2019-10-09 DIAGNOSIS — M6281 Muscle weakness (generalized): Secondary | ICD-10-CM | POA: Diagnosis not present

## 2019-10-09 DIAGNOSIS — G8929 Other chronic pain: Secondary | ICD-10-CM | POA: Diagnosis not present

## 2019-10-16 DIAGNOSIS — G8929 Other chronic pain: Secondary | ICD-10-CM | POA: Diagnosis not present

## 2019-10-16 DIAGNOSIS — M6281 Muscle weakness (generalized): Secondary | ICD-10-CM | POA: Diagnosis not present

## 2019-10-16 DIAGNOSIS — M545 Low back pain: Secondary | ICD-10-CM | POA: Diagnosis not present

## 2019-10-17 NOTE — Progress Notes (Signed)
FOLLOW UP  Assessment and Plan:   Migraines Managed by neuro;  significantly improved with aimovig addition Abortive works well   Hypertension Well controlled with current medications  Monitor blood pressure at home; patient to call if consistently greater than 130/80 Continue DASH diet.   Reminder to go to the ER if any CP, SOB, nausea, dizziness, severe HA, changes vision/speech, left arm numbness and tingling and jaw pain.  Cholesterol Currently at goal; continue statin LDL goal <100;  Continue low cholesterol diet and exercise.  Check lipid panel.   BMi 19 Continue to recommend diet heavy in fruits and veggies and low in animal meats, cheeses, and dairy products, appropriate calorie intake Discuss exercise recommendations routinely Continue to monitor weight at each visit  Vitamin D Def At goal at recent check; continue to recommend supplementation for goal of 60-100 Defer vitamin D level  Insomnia - good sleep hygiene discussed, increase day time activity, try melatonin or benadryl  Hasn't tolerated attempts to taper with other medications; continue to encourage lowest dose necessary, uses 1/2 tab only as able a few days a week   GERD Will try daily famotidine - 20 mg tab sent in to start taking daily prior to breakfast, second tab PRN if needed in the evening  Follow up if sx don't improve Discussed diet, avoiding triggers and other lifestyle changes   Continue diet and meds as discussed. Further disposition pending results of labs. Discussed med's effects and SE's.   Over 30 minutes of exam, counseling, chart review, and critical decision making was performed.   Future Appointments  Date Time Provider [place]  11/25/2019  2:00 PM Philemon Kingdom, MD [facility] None  01/21/2020  2:30 PM Liane Comber, NP [facility] None  07/21/2020  3:00 PM Liane Comber, NP [facility] None     ----------------------------------------------------------------------------------------------------------------------  HPI 64 y.o. female  presents for 3 month follow up on hypertension, cholesterol, glucose, weight and vitamin D deficiency.   Mother is 96, lives in [place], failure to thrive, currently in SNF for rehab with very poor oral intake. Will be going see her this weekend.   She has had migraines since she was 64 y/o; she is on amlodipine 5 mg and aimovig injection for migraines. She uses zolmitriptan as abortive which works well for her. Recently improved. She follows with Dr. Sima Matas who will be moving away, will be changing providers; she reports 2 migraines in the last 3 months, much less intense than previous.   She is prescribed valium 10 mg at night for sleep; she has attempted other medications and tapering off and hasn't done well, takes only 1/2 tab some nights.   She is seeing a new provider at [facility] for chronic back pain, getting injections which have been helpful. Recently started PT as well with modest improvement.   she has a diagnosis of GERD which is currently managed by omeprazole 20 mg PRN, estimates 2 days/week,  she reports symptoms is not currently well controlled, reports common reflux with breakfast  BMI is Body mass index is 19.59 kg/m., she has been working on diet and exercise, walks daily with 2 border collies, walks in woods behind her house. She pushes fruits/vegetables, avoids red meat. She admits to poor water intake, now drinking sparkling soda with lime. Quit drinking coca cola Wt Readings from Last 3 Encounters:  10/20/19 102 lb (46.3 kg)  07/21/19 103 lb (46.7 kg)  05/09/19 106 lb (48.1 kg)   Today their BP is BP: 110/70  She does workout. She denies chest pain, shortness of breath, dizziness.   She is on cholesterol medication Rosuvastatin 10 mg daily and denies myalgias. Her LDL cholesterol is at goal; trigs remained elevated at last  check. The cholesterol last visit was:   Lab Results  Component Value Date   CHOL 168 07/21/2019   HDL 56 07/21/2019   LDLCALC 88 07/21/2019   TRIG 143 07/21/2019   CHOLHDL 3.0 07/21/2019    She has been working on diet and exercise for glucose management, and denies increased appetite, nausea, paresthesia of the feet, polydipsia, polyuria, visual disturbances and vomiting. Last A1C in the office was:  Lab Results  Component Value Date   HGBA1C 5.5 07/21/2019   She is on thyroid medication. Her medication was not changed last visit.   Lab Results  Component Value Date   TSH 1.35 07/21/2019   Patient is on Vitamin D supplement taking 3000 IU daily  She is getting prolia injections at Dr. Arman Filter office for osteoporosis Lab Results  Component Value Date   VD25OH 86 07/21/2019        Current Medications:  Current Outpatient Medications on File Prior to Visit  Medication Sig  . AIMOVIG 140 MG/ML SOAJ   . amLODipine (NORVASC) 5 MG tablet TAKE 1 TABLET BY MOUTH EVERY DAY  . Cholecalciferol (VITAMIN D) 2000 units CAPS Take 3,000 Units by mouth daily.  Marland Kitchen denosumab (PROLIA) 60 MG/ML SOSY injection Inject 60 mg into the skin every 6 (six) months.  . diazepam (VALIUM) 10 MG tablet Take 5 mg by mouth at bedtime as needed for anxiety.   Marland Kitchen levothyroxine (SYNTHROID) 25 MCG tablet Take 1 tablet daily on an empty stomach with only water for 30 minutes & no Antacid meds, Calcium or Magnesium for 4 hours & avoid Biotin  . metaxalone (SKELAXIN) 800 MG tablet Take 800 mg by mouth as needed.   . Multiple Vitamins-Minerals (WOMENS MULTI PO) Take by mouth daily.  . rosuvastatin (CRESTOR) 20 MG tablet Take 1 tablet Daily for Cholesterol  . zolmitriptan (ZOMIG-ZMT) 2.5 MG disintegrating tablet DIS ONE T PO UTD PRF MIGRAINE  . omeprazole (PRILOSEC) 20 MG capsule Take 1-2 times daily as needed for reflux. (Patient not taking: Reported on 10/20/2019)   No current facility-administered medications on  file prior to visit.     Allergies:  Allergies  Allergen Reactions  . Aleve [Naproxen Sodium] Rash       . Lipitor [Atorvastatin] Other (See Comments)  . Niacin And Related Anaphylaxis  . Omnaris [Ciclesonide] Other (See Comments)    headache  . Ceftin [Cefuroxime Axetil] Rash  . Other Other (See Comments)  . Levaquin [Levofloxacin In D5w]     Muscle cramps  . Simvastatin   . Zetia [Ezetimibe]      Medical History:  Past Medical History:  Diagnosis Date  . Carpal tunnel syndrome on right   . History of concussion    1972, 1977, 1983  . Hyperlipidemia   . Hypertension   . Migraines    pain clinic [place]  . Osteoporosis    Family history- Reviewed and unchanged Social history- Reviewed and unchanged   Review of Systems:  Review of Systems  Constitutional: Negative for malaise/fatigue and weight loss.  HENT: Negative for hearing loss and tinnitus.   Eyes: Negative for blurred vision and double vision.  Respiratory: Negative for cough, sputum production, shortness of breath and wheezing.   Cardiovascular: Negative for chest pain, palpitations, orthopnea, claudication and  leg swelling.  Gastrointestinal: Positive for heartburn. Negative for abdominal pain, blood in stool, constipation, diarrhea, melena, nausea and vomiting.  Genitourinary: Negative.   Musculoskeletal: Positive for back pain (chronic). Negative for joint pain and myalgias.  Skin: Negative for rash.  Neurological: Positive for headaches (migraines; recently improved). Negative for dizziness, tingling, sensory change and weakness.  Endo/Heme/Allergies: Negative for environmental allergies and polydipsia.  Psychiatric/Behavioral: Negative for depression, memory loss and substance abuse. The patient has insomnia. The patient is not nervous/anxious.   All other systems reviewed and are negative.    Physical Exam: BP 110/70   Pulse 98   Temp (!) 97.5 F (36.4 C)   Wt 102 lb (46.3 kg)   SpO2 99%   BMI  19.59 kg/m  Wt Readings from Last 3 Encounters:  10/20/19 102 lb (46.3 kg)  07/21/19 103 lb (46.7 kg)  05/09/19 106 lb (48.1 kg)   General Appearance: Well nourished, in no apparent distress. Eyes: PERRLA, EOMs, conjunctiva no swelling or erythema Sinuses: No Frontal/maxillary tenderness ENT/Mouth: Ext aud canals clear, TMs without erythema, bulging. No erythema, swelling, or exudate on post pharynx.  Tonsils not swollen or erythematous. Hearing normal.  Neck: Supple, thyroid normal.  Respiratory: Respiratory effort normal, BS equal bilaterally without rales, rhonchi, wheezing or stridor.  Cardio: RRR with no MRGs. Brisk peripheral pulses without edema.  Abdomen: Soft, + BS.  Non tender, no guarding, rebound, hernias, masses. Lymphatics: Non tender without lymphadenopathy.  Musculoskeletal: No obvious deformity, Full ROM, 5/5 strength, Normal gait Skin: Warm, dry without rashes, lesions, ecchymosis.  Neuro: Cranial nerves intact. No cerebellar symptoms.  Psych: Awake and oriented X 3, normal affect, Insight and Judgment appropriate.    Isabel Ribas, NP 2:34 PM [facility]

## 2019-10-20 ENCOUNTER — Encounter: Payer: Self-pay | Admitting: Adult Health

## 2019-10-20 ENCOUNTER — Other Ambulatory Visit: Payer: Self-pay

## 2019-10-20 ENCOUNTER — Ambulatory Visit (INDEPENDENT_AMBULATORY_CARE_PROVIDER_SITE_OTHER): Payer: Medicare HMO | Admitting: Adult Health

## 2019-10-20 VITALS — BP 110/70 | HR 98 | Temp 97.5°F | Wt 102.0 lb

## 2019-10-20 DIAGNOSIS — K219 Gastro-esophageal reflux disease without esophagitis: Secondary | ICD-10-CM

## 2019-10-20 DIAGNOSIS — I10 Essential (primary) hypertension: Secondary | ICD-10-CM | POA: Diagnosis not present

## 2019-10-20 DIAGNOSIS — E782 Mixed hyperlipidemia: Secondary | ICD-10-CM

## 2019-10-20 DIAGNOSIS — G43809 Other migraine, not intractable, without status migrainosus: Secondary | ICD-10-CM | POA: Diagnosis not present

## 2019-10-20 DIAGNOSIS — Z79899 Other long term (current) drug therapy: Secondary | ICD-10-CM

## 2019-10-20 DIAGNOSIS — E559 Vitamin D deficiency, unspecified: Secondary | ICD-10-CM | POA: Diagnosis not present

## 2019-10-20 DIAGNOSIS — Z681 Body mass index (BMI) 19 or less, adult: Secondary | ICD-10-CM | POA: Diagnosis not present

## 2019-10-20 DIAGNOSIS — G4700 Insomnia, unspecified: Secondary | ICD-10-CM

## 2019-10-20 MED ORDER — FAMOTIDINE 20 MG PO TABS: ORAL_TABLET | ORAL | 1 refills | Status: DC

## 2019-10-20 NOTE — Patient Instructions (Signed)
Goals    . DIET - INCREASE WATER INTAKE     Increase fluids aiming for 55-65+ fluid ounces daily.     . Exercise 150 min/wk Moderate Activity     30 min x 5 days a week    . LDL CALC < 100       Try new medication - famotidine/pepcid - 30 min prior to breakfast. Can take second tab in the evening prior to dinner or bedtime if needed.     Heartburn Heartburn is a type of pain or discomfort that can happen in the throat or chest. It is often described as a burning pain. It may also cause a bad, acid-like taste in the mouth. Heartburn may feel worse when you lie down or bend over, and it is often worse at night. Heartburn may be caused by stomach contents that move back up into the esophagus (reflux). Follow these instructions at home: Eating and drinking   Avoid certain foods and drinks as told by your health care provider. This may include: ? Coffee and tea (with or without caffeine). ? Drinks that contain alcohol. ? Energy drinks and sports drinks. ? Carbonated drinks or sodas. ? Chocolate and cocoa. ? Peppermint and mint flavorings. ? Garlic and onions. ? Horseradish. ? Spicy and acidic foods, including peppers, chili powder, curry powder, vinegar, hot sauces, and barbecue sauce. ? Citrus fruit juices and citrus fruits, such as oranges, lemons, and limes. ? Tomato-based foods, such as red sauce, chili, salsa, and pizza with red sauce. ? Fried and fatty foods, such as donuts, french fries, potato chips, and high-fat dressings. ? High-fat meats, such as hot dogs and fatty cuts of red and white meats, such as rib eye steak, sausage, ham, and bacon. ? High-fat dairy items, such as whole milk, butter, and cream cheese.  Eat small, frequent meals instead of large meals.  Avoid drinking large amounts of liquid with your meals.  Avoid eating meals during the 2-3 hours before bedtime.  Avoid lying down right after you eat.  Do not exercise right after you eat. Lifestyle       If you are overweight, reduce your weight to an amount that is healthy for you. Ask your health care provider for guidance about a safe weight loss goal.  Do not use any products that contain nicotine or tobacco, such as cigarettes, e-cigarettes, and chewing tobacco. These can make your symptoms worse. If you need help quitting, ask your health care provider.  Wear loose-fitting clothing. Do not wear anything tight around your waist that causes pressure on your abdomen.  Raise (elevate) the head of your bed about 6 inches (15 cm) when you sleep.  Try to reduce your stress, such as with yoga or meditation. If you need help reducing stress, ask your health care provider. General instructions  Pay attention to any changes in your symptoms.  Take over-the-counter and prescription medicines only as told by your health care provider. ? Do not take aspirin, ibuprofen, or other NSAIDs unless your health care provider told you to do so. ? Stop medicines only as told by your health care provider. If you stop taking some medicines too quickly, your symptoms may get worse.  Keep all follow-up visits as told by your health care provider. This is important. Contact a health care provider if:  You have new symptoms.  You have unexplained weight loss.  You have difficulty swallowing, or it hurts to swallow.  You have wheezing or  a persistent cough.  Your symptoms do not improve with treatment.  You have frequent heartburn for more than 2 weeks. Get help right away if:  You have pain in your arms, neck, jaw, teeth, or back.  You feel sweaty, dizzy, or light-headed.  You have chest pain or shortness of breath.  You vomit and your vomit looks like blood or coffee grounds.  Your stool is bloody or black. These symptoms may represent a serious problem that is an emergency. Do not wait to see if the symptoms will go away. Get medical help right away. Call your local emergency services (911  in the [place] Do not drive yourself to the hospital. Summary  Heartburn is a type of pain or discomfort that can happen in the throat or chest. It is often described as a burning pain. It may also cause a bad, acid-like taste in the mouth.  Avoid certain foods and drinks as told by your health care provider.  Take over-the-counter and prescription medicines only as told by your health care provider. Do not take aspirin, ibuprofen, or other NSAIDs unless your health care provider told you to do so.  Contact a health care provider if your symptoms do not improve or they get worse. This information is not intended to replace advice given to you by your health care provider. Make sure you discuss any questions you have with your health care provider. Document Revised: 12/17/2017 Document Reviewed: 12/17/2017 Elsevier Patient Education  [place].

## 2019-10-21 DIAGNOSIS — M6281 Muscle weakness (generalized): Secondary | ICD-10-CM | POA: Diagnosis not present

## 2019-10-21 DIAGNOSIS — M545 Low back pain: Secondary | ICD-10-CM | POA: Diagnosis not present

## 2019-10-21 DIAGNOSIS — G8929 Other chronic pain: Secondary | ICD-10-CM | POA: Diagnosis not present

## 2019-10-21 LAB — COMPLETE METABOLIC PANEL WITH GFR
AG Ratio: 1.8 (calc) (ref 1.0–2.5)
ALT: 14 U/L (ref 6–29)
AST: 24 U/L (ref 10–35)
Albumin: 4.4 g/dL (ref 3.6–5.1)
Alkaline phosphatase (APISO): 24 U/L — ABNORMAL LOW (ref 37–153)
BUN: 19 mg/dL (ref 7–25)
CO2: 27 mmol/L (ref 20–32)
Calcium: 10.1 mg/dL (ref 8.6–10.4)
Chloride: 104 mmol/L (ref 98–110)
Creat: 0.97 mg/dL (ref 0.50–0.99)
GFR, Est African American: 71 mL/min/{1.73_m2} (ref >=60)
GFR, Est Non African American: 61 mL/min/{1.73_m2} (ref >=60)
Globulin: 2.4 g/dL (calc) (ref 1.9–3.7)
Glucose, Bld: 84 mg/dL (ref 65–99)
Potassium: 4.2 mmol/L (ref 3.5–5.3)
Sodium: 140 mmol/L (ref 135–146)
Total Bilirubin: 0.3 mg/dL (ref 0.2–1.2)
Total Protein: 6.8 g/dL (ref 6.1–8.1)

## 2019-10-21 LAB — LIPID PANEL
Cholesterol: 173 mg/dL (ref <200)
HDL: 60 mg/dL (ref >=50)
LDL Cholesterol (Calc): 92 mg/dL (calc)
Non-HDL Cholesterol (Calc): 113 mg/dL (calc) (ref <130)
Total CHOL/HDL Ratio: 2.9 (calc) (ref <5.0)
Triglycerides: 109 mg/dL (ref <150)

## 2019-10-21 LAB — CBC WITH DIFFERENTIAL/PLATELET
Absolute Monocytes: 676 cells/uL (ref 200–950)
Basophils Absolute: 39 cells/uL (ref 0–200)
Basophils Relative: 0.4 %
Eosinophils Absolute: 78 cells/uL (ref 15–500)
Eosinophils Relative: 0.8 %
HCT: 41.8 % (ref 35.0–45.0)
Hemoglobin: 13.5 g/dL (ref 11.7–15.5)
Lymphs Abs: 2176 cells/uL (ref 850–3900)
MCH: 28.6 pg (ref 27.0–33.0)
MCHC: 32.3 g/dL (ref 32.0–36.0)
MCV: 88.6 fL (ref 80.0–100.0)
MPV: 10.3 fL (ref 7.5–12.5)
Monocytes Relative: 6.9 %
Neutro Abs: 6831 cells/uL (ref 1500–7800)
Neutrophils Relative %: 69.7 %
Platelets: 316 10*3/uL (ref 140–400)
RBC: 4.72 10*6/uL (ref 3.80–5.10)
RDW: 13.2 % (ref 11.0–15.0)
Total Lymphocyte: 22.2 %
WBC: 9.8 10*3/uL (ref 3.8–10.8)

## 2019-10-21 LAB — MAGNESIUM: Magnesium: 2.3 mg/dL (ref 1.5–2.5)

## 2019-10-21 LAB — TSH: TSH: 1.12 mIU/L (ref 0.40–4.50)

## 2019-10-24 ENCOUNTER — Other Ambulatory Visit: Payer: Self-pay | Admitting: Adult Health

## 2019-10-28 DIAGNOSIS — M6281 Muscle weakness (generalized): Secondary | ICD-10-CM | POA: Diagnosis not present

## 2019-10-28 DIAGNOSIS — M545 Low back pain: Secondary | ICD-10-CM | POA: Diagnosis not present

## 2019-10-28 DIAGNOSIS — G8929 Other chronic pain: Secondary | ICD-10-CM | POA: Diagnosis not present

## 2019-11-21 ENCOUNTER — Other Ambulatory Visit: Payer: Self-pay

## 2019-11-21 ENCOUNTER — Telehealth: Payer: Self-pay | Admitting: Internal Medicine

## 2019-11-21 NOTE — Telephone Encounter (Signed)
Submitted to prolia portal  Awaiting SOB

## 2019-11-25 ENCOUNTER — Ambulatory Visit: Payer: Medicare HMO | Admitting: Internal Medicine

## 2019-11-25 ENCOUNTER — Other Ambulatory Visit: Payer: Self-pay

## 2019-11-25 ENCOUNTER — Other Ambulatory Visit: Payer: Self-pay | Admitting: Internal Medicine

## 2019-11-25 ENCOUNTER — Encounter: Payer: Self-pay | Admitting: Internal Medicine

## 2019-11-25 VITALS — BP 110/72 | HR 90 | Ht 60.5 in | Wt 99.2 lb

## 2019-11-25 DIAGNOSIS — R748 Abnormal levels of other serum enzymes: Secondary | ICD-10-CM | POA: Diagnosis not present

## 2019-11-25 DIAGNOSIS — E559 Vitamin D deficiency, unspecified: Secondary | ICD-10-CM | POA: Diagnosis not present

## 2019-11-25 DIAGNOSIS — M810 Age-related osteoporosis without current pathological fracture: Secondary | ICD-10-CM

## 2019-11-25 LAB — ALKALINE PHOSPHATASE: Alkaline Phosphatase: 25 U/L — ABNORMAL LOW (ref 39–117)

## 2019-11-25 LAB — VITAMIN D 25 HYDROXY (VIT D DEFICIENCY, FRACTURES): VITD: >120 ng/mL

## 2019-11-25 NOTE — Patient Instructions (Signed)
Please stop at the lab.  Please continue vitamin D 2000 units daily + multivitamin.  Try to schedule the next bone density scan after 02/05/2020.  Please come back for a follow-up appointment in 1 year.

## 2019-11-25 NOTE — Progress Notes (Signed)
Patient ID: Isabel [name] Work Isabel [name], female   DOB: 02/23/1956, 64 y.o.   MRN: [id number]   This visit occurred during the SARS-CoV-2 public health emergency.  Safety protocols were in place, including screening questions prior to the visit, additional usage of staff PPE, and extensive cleaning of exam room while observing appropriate contact time as indicated for disinfecting solutions.   HPI  Isabel [name] is a 64 y.o.-year-old female, returning for follow-up for osteoporosis and history of vitamin D deficiency.  Last visit 1 year ago.  Pt was dx with OP in 2013.  Reviewed DXA scan reports: Date L1-L4 T score FN T score  02/04/2018 ([facility]) -3.2 RFN: -2.0 LFN: -2.4  10/15/2015 [facility])  -3.0  RFN: -1.8 LFN: -2.1   10/07/2013 [facility])  -3.1 (-5.6%*)  RFN: -1.9 LFN: -2.2   07/10/2011 [facility])  -2.8  RFN: n/a LFN: n/a   + h/o one fracture as a child: - R arm - in 12 (31 y/o)   She fell from the kitchen counter in 2000.  No fractures as an adult.  She denies vertigo, orthostasis, poor vision, but occasional dizziness.  Treatments reviewed: - Alendronate: for many years >> stopped because of possible ONJ.  Pain was migratory-questionable nerve pain. - Prolia -05/02/2018, 11/25/2018, 06/11/2019  After our last visit, she was reticent to continue with Prolia.  I referred her to Dr. Edmonia James at [facility] for a second opinion.  He suggested to continue with Prolia versus using Actonel.  Patient decided to continue with Prolia.  Reviewed the reports  of her 3-D reconstructed images of her mouth from 09/06/2016: "The region of interest demonstrates radiographic signs that may be suggestive of osteonecrosis/osteomyelitis. If the patient has a history of taking bisphosphonate drugs, the region of interest would correlate with osteonecrosis. Otherwise, the region of interest may be osteomyelitis. Clinical correlation is recommended".  Her N-telopeptide was not  suppressed in 2018 (ideally <10), but not higher than 18: Component     Latest Ref Rng & Units 11/23/2016  N-Telopeptide     6.2 - 19.0 nM BCE 15.4   Vitamin D level normal at last check but this was high in the past: Lab Results  Component Value Date   VD25OH 86 07/21/2019   VD25OH 113.35 (HH) 11/25/2018   VD25OH 73 06/04/2018   VD25OH 101.31 (HH) 03/21/2018   VD25OH 67 11/08/2016   VD25OH 56 05/03/2016   VD25OH 65 05/04/2015   VD25OH 60 12/17/2014   VD25OH 49 09/02/2014   VD25OH 63 04/23/2014   She is on: - Calcium 500 mg daily - Vitamin D 6000 >> 8000 >> 6000 >> 3000 units daily (MVI + supplement)  No weightbearing exercises. She was practicing yoga x 9 years >> then stopped b/c pain with movement.  She walks her dog daily in the [place].  No history of high vitamin A intake.  It is unclear when she started menopause as she was taking hormonal tx for severe HAs (from severe cervical OA). She was also on Methadone, tried Botox.  She has nerve burns at [facility].  Her mother has a history of a dowager hump.  No history of kidney stones.  No history of hyper or hypocalcemia.  No hyperparathyroidism: Lab Results  Component Value Date   CALCIUM 10.1 10/20/2019   CALCIUM 9.8 07/21/2019   CALCIUM 9.9 04/03/2019   CALCIUM 9.6 01/13/2019   CALCIUM 10.2 10/02/2018   CALCIUM 10.1 06/04/2018  CALCIUM 10.2 03/21/2018   CALCIUM 10.0 12/27/2017   CALCIUM 10.5 (H) 11/27/2017   CALCIUM 10.1 05/22/2017   No history of hyperthyroidism.  She takes levothyroxine 25 mcg daily.  Reviewed previous TFTs:: Lab Results  Component Value Date   TSH 1.12 10/20/2019   TSH 1.35 07/21/2019   TSH 1.08 04/03/2019   TSH 1.47 01/13/2019   TSH 1.66 10/02/2018   No CKD.  Last BUN/creatinine: Lab Results  Component Value Date   BUN 19 10/20/2019   CREATININE 0.97 10/20/2019   She has a history of low alkaline phosphatase: Lab Results  Component Value Date   ALKPHOS 22 (L)  11/25/2018   ALKPHOS 26 (L) 02/26/2017   ALKPHOS 23 (L) 11/08/2016   ALKPHOS 27 (L) 05/03/2016   ALKPHOS 19 (L) 11/03/2015   ALKPHOS 24 (L) 05/04/2015   ALKPHOS 22 (L) 12/17/2014   ALKPHOS 23 (L) 09/02/2014   ALKPHOS 27 (L) 04/23/2014   ALKPHOS 29 (L) 12/16/2013   ALKPHOS 40 09/08/2013   She has extensive FH of Alzheimer on her father's side.  ROS: Constitutional: no weight gain/no weight loss, no fatigue, no subjective hyperthermia, no subjective hypothermia Eyes: no blurry vision, no xerophthalmia ENT: no sore throat, no nodules palpated in neck, no dysphagia, no odynophagia, no hoarseness Cardiovascular: no CP/no SOB/no palpitations/no leg swelling Respiratory: no cough/no SOB/no wheezing Gastrointestinal: no N/no V/no D/no C/no acid reflux Musculoskeletal: no muscle aches/no joint aches Skin: no rashes, no hair loss Neurological: no tremors/no numbness/no tingling/no dizziness  I reviewed pt's medications, allergies, PMH, social hx, family hx, and changes were documented in the history of present illness. Otherwise, unchanged from my initial visit note.  Past Medical History:  Diagnosis Date  . Carpal tunnel syndrome on right   . History of concussion    1972, 1977, 1983  . Hyperlipidemia   . Hypertension   . Migraines    pain clinic [place]  . Osteoporosis    Past Surgical History:  Procedure Laterality Date  . 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 fractured right wrist    . LEEP     early 20's  . MOUTH SURGERY    . WRIST SURGERY Right 1973   Social History   Social History  . Marital status: Married    Spouse name: N/A  . Number of children: 0   Occupational History  . Home maker   Social History Main Topics  . Smoking status: Former Smoker    Quit date: 06/07/1994  . Smokeless tobacco: Never Used  . Alcohol use Yes     Comment: rare  . Drug use: No   Current Outpatient Medications on File Prior to Visit  Medication Sig  Dispense Refill  . AIMOVIG 140 MG/ML SOAJ   2  . amLODipine (NORVASC) 5 MG tablet TAKE 1 TABLET BY MOUTH EVERY DAY 90 tablet 1  . Cholecalciferol (VITAMIN D) 2000 units CAPS Take 3,000 Units by mouth daily.    Marland Kitchen denosumab (PROLIA) 60 MG/ML SOSY injection Inject 60 mg into the skin every 6 (six) months.    . diazepam (VALIUM) 10 MG tablet Take 5 mg by mouth at bedtime as needed for anxiety.     . famotidine (PEPCID) 20 MG tablet Take 1 tab 30 min prior to breakfast daily. Can take second tab in the evening if needed for breakthrough acid. 60 tablet 1  . levothyroxine (SYNTHROID) 25 MCG tablet Take 1 tablet daily  on an empty stomach with only water for 30 minutes & no Antacid meds, Calcium or Magnesium for 4 hours & avoid Biotin 90 tablet 3  . metaxalone (SKELAXIN) 800 MG tablet Take 800 mg by mouth as needed.     . Multiple Vitamins-Minerals (WOMENS MULTI PO) Take by mouth daily.    . rosuvastatin (CRESTOR) 20 MG tablet Take 1 tablet Daily for Cholesterol 90 tablet 1  . zolmitriptan (ZOMIG-ZMT) 2.5 MG disintegrating tablet DIS ONE T PO UTD PRF MIGRAINE  5   No current facility-administered medications on file prior to visit.   Allergies  Allergen Reactions  . Aleve [Naproxen Sodium] Rash       . Lipitor [Atorvastatin] Other (See Comments)  . Niacin And Related Anaphylaxis  . Omnaris [Ciclesonide] Other (See Comments)    headache  . Ceftin [Cefuroxime Axetil] Rash  . Other Other (See Comments)  . Levaquin [Levofloxacin In D5w]     Muscle cramps  . Simvastatin   . Zetia [Ezetimibe]    Family History  Problem Relation Age of Onset  . Depression Mother   . Migraines Mother   . Stroke Mother   . Alzheimer's disease Father   . Hyperlipidemia Father   . Hypertension Father   . Migraines Sister   . Migraines Brother   . Colon cancer Neg Hx    PE: There were no vitals taken for this visit. Wt Readings from Last 3 Encounters:  10/20/19 102 lb (46.3 kg)  07/21/19 103 lb (46.7 kg)   05/09/19 106 lb (48.1 kg)   Constitutional: Thin, in NAD Eyes: PERRLA, EOMI, no exophthalmos ENT: moist mucous membranes, no thyromegaly, no cervical lymphadenopathy Cardiovascular: RRR, No MRG Respiratory: CTA B Gastrointestinal: abdomen soft, NT, ND, BS+ Musculoskeletal: no deformities, strength intact in all 4 Skin: moist, warm, no rashes Neurological: no tremor with outstretched hands, DTR normal in all 4  Assessment: 1. Osteoporosis - Of note, she finished 1 year of osteo-strong skeletal loading.  Discussed with her $89 per month and was not covered by insurance  2. Vit D deficiency  3. Low alkaline phosphatase  Other tx'ing Dr's: Dr. Amil Amen Dr. Lazaro Arms  Plan: 1. Osteoporosis -Likely age-related/postmenopausal +/- family history of osteoporosis -On the latest bone mineral density report, her T-scores are slightly lower than before.  However, the last report was generated on a different DXA machine, so the scores are not directly comparable.  They were low enough in the osteoporosis range to suggest the need for treatment, though.   -She was initially very reticent to start osteoporosis treatment as she has a high risk for ONJ (she was diagnosed with this in the past after being on Fosamax).  However, we discussed about starting Prolia, which has a lower risk of ONJ compared to bisphosphonates.  We also discussed about Tymlos/Forteo and the newer Evenity.  After thinking about options, she contacted me through [place] about the loss, however, after everything was ready for her to start, she changed her mind and decided for Forteo.  Due to the fact that this became off label, and not likely to be covered by her insurance, she decided to proceed with Prolia.  She had 3 infusions: Her first infusion in 05/02/2018, last infusion 06/11/2019.   -She tolerates Prolia well.  No jaw pain, no thigh/hip pain -At last visit we discussed about continue Prolia for at least 3 years  but we could continue for up to 10 years, if needed. -Since last visit  she contacted me about doubts continue with Prolia and at that time I suggested a second opinion at [facility], with Dr. Edmonia James.  He saw the patient in consultation and recommended to continue with Prolia versus Actonel.  She decided to continue with Prolia. -She is due for another Prolia injection next month -She is due for bone density scan in 3 months -We will recheck a BMP and a vitamin D level today  2. Vit D deficiency -We gradually decreased her vitamin D daily dose from 8000 to 6000 to 3000 units daily -Platelet level was reviewed and this was normal 07/2019, at 52 -We will repeat this today  3. Low Aphos -Possible causes are: hypophosphatasia, blood collected with EDTA or oxalate anticoagulant, hypothyroidism, vitamin C and B12 deficiency, Milk alkali syndrome, protein/calorie malnutrition, zinc and magnesium deficiency. -TSH is normal. She takes magnesium and B12.  No previous history of fractures or tooth problems to suggest hypophosphatasia. -Recheck alk phos today -At last visit I recommended a multivitamin daily  Orders Placed This Encounter  Procedures  . DG Bone Density  . VITAMIN D 25 Hydroxy (Vit-D Deficiency, Fractures)  . Alkaline phosphatase   Office Visit on 11/25/2019  Component Date Value Ref Range Status  . VITD 11/25/2019 >120  ng/mL Final  . Alkaline Phosphatase 11/25/2019 25* 39 - 117 U/L Final   Vitamin D is again undetectably high.  I sent patient a message telling her to stop every vitamin D supplement she had at home.  She also needs to return for labs in 1.5 months.  At this point, I believe that the alkaline phosphatase is low due to the very high vitamin D.  Philemon Kingdom, MD PhD [facility]

## 2019-11-28 ENCOUNTER — Encounter: Payer: Self-pay | Admitting: Internal Medicine

## 2019-12-15 ENCOUNTER — Telehealth: Payer: Self-pay

## 2019-12-15 NOTE — Telephone Encounter (Signed)
According to summary of benefits, medication does require PA. After PA, patient is responsible for 20% of medication plus $25 administration fee. Patient has a $5,000 deductible, and $190 has been met so far. PA to be initiated.

## 2019-12-15 NOTE — Telephone Encounter (Signed)
Called patient and left voicemail for her to return the phone call. Patient's insurance appears to be wrong and cannot do prior authorization for Prolia without first getting accurate insurance.

## 2019-12-24 NOTE — Telephone Encounter (Signed)
Called pt's insurance and got more information about her plan and coverage for Prolia.  Patient's plan is active.  Rep from insurance company ran a claim and if patient gets Prolia at her [facility] (not specialty pharmacy), the cost is $374. The Rx must be written as 1 per 30 days for cost purposes.  Attempted to contact pt and inform her of this, and she did not answer. Left a voicemail requesting a callback and informed her that it was time sensitive. Waiting on a call back from pt.

## 2019-12-31 ENCOUNTER — Encounter: Payer: Self-pay | Admitting: Internal Medicine

## 2019-12-31 ENCOUNTER — Telehealth: Payer: Self-pay | Admitting: Internal Medicine

## 2019-12-31 ENCOUNTER — Other Ambulatory Visit: Payer: Self-pay

## 2019-12-31 MED ORDER — DENOSUMAB 60 MG/ML ~~LOC~~ SOSY: 60.0000 mg | PREFILLED_SYRINGE | SUBCUTANEOUS | 0 refills | Status: DC

## 2019-12-31 NOTE — Telephone Encounter (Signed)
Let's send her a letter.

## 2019-12-31 NOTE — Telephone Encounter (Signed)
Called pt's home and cellphone number. Pt did not answer, and a voicemail was left requesting a call back to discuss Prolia. This is the third attempt at reaching the pt. Please advise on how you would like to proceed.

## 2019-12-31 NOTE — Telephone Encounter (Signed)
Pt returned phone call and said that she would take the Prolia injection and did verbalized acceptable of the financial responsibility. Pt was informed of the process by which her insurance stated this has to be done. It has to be sent to her local pharmacy. Once she is notified it is ready, she is to notify this office and schedule an appt, and then pick up the shot and bring it to her appt. Rx has been sent to pt's local retail pharmacy.

## 2019-12-31 NOTE — Telephone Encounter (Signed)
Patient requests to be called at ph# [phone] re: Prolia

## 2019-12-31 NOTE — Telephone Encounter (Signed)
Patient returned phone call and she was made aware of the information below. She did verbalize understanding and stated that she would speak with her husband and call this office back.

## 2019-12-31 NOTE — Telephone Encounter (Signed)
This has been addressed in a separate telephone encounter.

## 2020-01-01 ENCOUNTER — Other Ambulatory Visit: Payer: Self-pay | Admitting: Internal Medicine

## 2020-01-01 DIAGNOSIS — E673 Hypervitaminosis D: Secondary | ICD-10-CM

## 2020-01-01 NOTE — Telephone Encounter (Signed)
I called and spoke with pt as well as her husband to clarify. Pt was misunderstanding of the explanation give to her yesterday. As previously documented, pt does owe $374 at the local retail pharmacy for her Prolia medication. Her insurance requires that she pay a $25 copay for administration of the medication at the providers office. Pt did receive a phone call from her pharmacy stating that the Prolia injection was ready for pick up and her cost is $373.99. At this point, pt was scheduled for her injection. She chose to come to this office tomorrow at 2:30pm, and she was instructed to pick up the Prolia injection before coming to the pharmacy.  All of this information was then relayed to the pt's husband as well, and he did verbalize understanding and repeated all the correct information back to me.  Pt as well as her husband will pick up the Prolia injection tomorrow before coming to this office for the administration. Pt and husband were encouraged to call back if they should have any questions, comments, or concerns of any kind and this office will be obliged to help in any way possible.

## 2020-01-02 ENCOUNTER — Other Ambulatory Visit: Payer: Self-pay

## 2020-01-02 ENCOUNTER — Ambulatory Visit: Payer: Medicare HMO

## 2020-01-02 DIAGNOSIS — M810 Age-related osteoporosis without current pathological fracture: Secondary | ICD-10-CM

## 2020-01-02 MED ORDER — DENOSUMAB 60 MG/ML ~~LOC~~ SOSY
60.0000 mg | PREFILLED_SYRINGE | Freq: Once | SUBCUTANEOUS | Status: AC
  Administered 2020-01-02: 60 mg via SUBCUTANEOUS

## 2020-01-02 NOTE — Progress Notes (Signed)
After obtaining consent, and per orders of Dr. Cruzita Lederer, injection of Prolia 60 mg  given by Gwinda Maine. Patient instructed to remain in clinic for 20 minutes afterwards, and to report any adverse reaction to me immediately.

## 2020-01-06 ENCOUNTER — Encounter: Payer: Self-pay | Admitting: Internal Medicine

## 2020-01-06 ENCOUNTER — Ambulatory Visit: Payer: Medicare HMO | Admitting: Internal Medicine

## 2020-01-12 ENCOUNTER — Encounter: Payer: Self-pay | Admitting: Internal Medicine

## 2020-01-20 NOTE — Progress Notes (Signed)
FOLLOW UP  Assessment and Plan:   Migraines Managed by neuro;  significantly improved with aimovig addition Abortive works well   Hypertension Well controlled with current medications  Monitor blood pressure at home; patient to call if consistently greater than 130/80 Continue DASH diet.   Reminder to go to the ER if any CP, SOB, nausea, dizziness, severe HA, changes vision/speech, left arm numbness and tingling and jaw pain.  Cholesterol Currently at goal; continue statin LDL goal <100;  Continue low cholesterol diet and exercise.  Check lipid panel.   BMi <19 Continue to recommend diet heavy in fruits and veggies and low in animal meats, cheeses, and dairy products, appropriate calorie intake Discuss exercise recommendations routinely Continue to monitor weight at each visit  Vitamin D Def Above goal at recent check; has stopped, reports fatigue since, requests recheck and restart a low dose supplement if indicated - will communicate with Dr. Cruzita Lederer for recommendations  continue to recommend supplementation for goal of 60-100 Check vitamin D level per patient preference  Insomnia - good sleep hygiene discussed, increase day time activity, try melatonin or benadryl  Hasn't tolerated attempts to taper with other medications; continue to encourage lowest dose necessary, uses 1/2 tab only as able a few days a week   GERD Try taking famotidine 20 mg daily PM, then PRN 2nd dose if needed Discussed diet, avoiding triggers and other lifestyle changes   Continue diet and meds as discussed. Further disposition pending results of labs. Discussed med's effects and SE's.   Over 30 minutes of exam, counseling, chart review, and critical decision making was performed.   Future Appointments  Date Time Provider [place]  07/21/2020  3:00 PM Liane Comber, NP [id number] None  11/25/2020  2:00 PM Philemon Kingdom, MD [id number] None     ----------------------------------------------------------------------------------------------------------------------  HPI 64 y.o. female  presents for 3 month follow up on hypertension, cholesterol, glucose, weight and vitamin D deficiency.   Mother is 72, lives in [place], failure to thrive, currently in SNF for rehab with very poor oral intake. Calls to speak with her regularly.   She has had migraines since she was 64 y/o; she is on amlodipine 5 mg and aimovig injection for migraines. She uses zolmitriptan as abortive which works well for her. Recently improved. She follows with Dr. Sima Matas who will be moving away, will be changing providers; she reports 2 migraines in the last 3 months, much less intense than previous.   She is prescribed valium 10 mg at night for sleep; she has attempted other medications and tapering off and hasn't done well, takes only 1/2 tab some nights.   She is seeing a new provider at [facility] for chronic back pain, getting injections which have been helpful, but doesn't last very long. Did PT this year but felt worse, stopped.   she has a diagnosis of GERD which is currently managed while lifestyle, has some AM sx but reports not taking famotidine due to inconvenience with taking thyroid med she reports symptoms is not currently well controlled   BMI is Body mass index is 18.34 kg/m., she has been working on diet and exercise, walks daily with 2 border collies, walks in woods behind her house.  She pushes fruits/vegetables, avoids red meat.  She admits to poor water intake, now drinking sparkling soda with lime. Quit drinking coca cola, has been losing weight since Typically eats light breakfast and lunch, large dinner, eats when she's hungry  Reports long history of weight  in 80s-90s, this is essentially baseline for her Wt Readings from Last 3 Encounters:  01/21/20 95 lb 8 oz (43.3 kg)  11/25/19 99 lb 3.2 oz (45 kg)  10/20/19 102 lb (46.3 kg)   Today  their BP is BP: 120/74  She does workout. She denies chest pain, shortness of breath, dizziness.   She is on cholesterol medication Rosuvastatin 10 mg daily and denies myalgias. Her LDL cholesterol is at goal. The cholesterol last visit was:   Lab Results  Component Value Date   CHOL 173 10/20/2019   HDL 60 10/20/2019   LDLCALC 92 10/20/2019   TRIG 109 10/20/2019   CHOLHDL 2.9 10/20/2019    She has been working on diet and exercise for glucose management, and denies increased appetite, nausea, paresthesia of the feet, polydipsia, polyuria, visual disturbances and vomiting. Last A1C in the office was:  Lab Results  Component Value Date   HGBA1C 5.5 07/21/2019   She is on thyroid medication. Her medication was not changed last visit.   Lab Results  Component Value Date   TSH 1.12 10/20/2019   Patient was on Vitamin D supplement taking 3000 IU daily with her multivitamin She is getting prolia injections at Dr. Arman Filter office for osteoporosis Was advised to stop supplement completed in April due to elevated value Patient reports since stopping has atypical fatigue, very poor energy, will fall asleep while reading a book or watching TV which is atypical for her  She is requesting vitamin D recheck today, would prefer to restart for energy benefits  Lab Results  Component Value Date   VD25OH >120 11/25/2019        Current Medications:  Current Outpatient Medications on File Prior to Visit  Medication Sig  . AIMOVIG 140 MG/ML SOAJ   . amLODipine (NORVASC) 5 MG tablet TAKE 1 TABLET BY MOUTH EVERY DAY  . denosumab (PROLIA) 60 MG/ML SOSY injection Inject 60 mg into the skin every 6 (six) months.  . diazepam (VALIUM) 10 MG tablet Take 5 mg by mouth at bedtime as needed for anxiety.   . famotidine (PEPCID) 20 MG tablet Take 1 tab 30 min prior to breakfast daily. Can take second tab in the evening if needed for breakthrough acid.  Marland Kitchen levothyroxine (SYNTHROID) 25 MCG tablet Take 1 tablet  daily on an empty stomach with only water for 30 minutes & no Antacid meds, Calcium or Magnesium for 4 hours & avoid Biotin  . metaxalone (SKELAXIN) 800 MG tablet Take 800 mg by mouth as needed.   . rosuvastatin (CRESTOR) 20 MG tablet Take 1 tablet Daily for Cholesterol  . zolmitriptan (ZOMIG-ZMT) 2.5 MG disintegrating tablet DIS ONE T PO UTD PRF MIGRAINE  . Cholecalciferol (VITAMIN D) 2000 units CAPS Take 3,000 Units by mouth daily.   No current facility-administered medications on file prior to visit.     Allergies:  Allergies  Allergen Reactions  . Aleve [Naproxen Sodium] Rash       . Lipitor [Atorvastatin] Other (See Comments)  . Niacin And Related Anaphylaxis  . Omnaris [Ciclesonide] Other (See Comments)    headache  . Ceftin [Cefuroxime Axetil] Rash  . Other Other (See Comments)  . Levaquin [Levofloxacin In D5w]     Muscle cramps  . Simvastatin   . Zetia [Ezetimibe]      Medical History:  Past Medical History:  Diagnosis Date  . Carpal tunnel syndrome on right   . History of concussion    1972, 1977, 1983  .  Hyperlipidemia   . Hypertension   . Migraines    pain clinic [place]  . Osteoporosis    Family history- Reviewed and unchanged Social history- Reviewed and unchanged   Review of Systems:  Review of Systems  Constitutional: Negative for malaise/fatigue and weight loss.  HENT: Negative for hearing loss and tinnitus.   Eyes: Negative for blurred vision and double vision.  Respiratory: Negative for cough, sputum production, shortness of breath and wheezing.   Cardiovascular: Negative for chest pain, palpitations, orthopnea, claudication and leg swelling.  Gastrointestinal: Positive for heartburn. Negative for abdominal pain, blood in stool, constipation, diarrhea, melena, nausea and vomiting.  Genitourinary: Negative.   Musculoskeletal: Positive for back pain (chronic). Negative for joint pain and myalgias.  Skin: Negative for rash.  Neurological: Positive  for headaches (migraines; recently improved). Negative for dizziness, tingling, sensory change and weakness.  Endo/Heme/Allergies: Negative for environmental allergies and polydipsia.  Psychiatric/Behavioral: Negative for depression, memory loss and substance abuse. The patient has insomnia. The patient is not nervous/anxious.   All other systems reviewed and are negative.    Physical Exam: BP 120/74   Pulse 81   Temp (!) 97.3 F (36.3 C)   Wt 95 lb 8 oz (43.3 kg)   SpO2 99%   BMI 18.34 kg/m  Wt Readings from Last 3 Encounters:  01/21/20 95 lb 8 oz (43.3 kg)  11/25/19 99 lb 3.2 oz (45 kg)  10/20/19 102 lb (46.3 kg)   General Appearance: Well nourished, in no apparent distress. Eyes: PERRLA, EOMs, conjunctiva no swelling or erythema Sinuses: No Frontal/maxillary tenderness ENT/Mouth: Ext aud canals clear, TMs without erythema, bulging. No erythema, swelling, or exudate on post pharynx.  Tonsils not swollen or erythematous. Hearing normal.  Neck: Supple, thyroid normal.  Respiratory: Respiratory effort normal, BS equal bilaterally without rales, rhonchi, wheezing or stridor.  Cardio: RRR with no MRGs. Brisk peripheral pulses without edema.  Abdomen: Soft, + BS.  Non tender, no guarding, rebound, hernias, masses. Lymphatics: Non tender without lymphadenopathy.  Musculoskeletal: No obvious deformity, Full ROM, 5/5 strength, Normal gait Skin: Warm, dry without rashes, lesions, ecchymosis.  Neuro: Cranial nerves intact. No cerebellar symptoms.  Psych: Awake and oriented X 3, normal affect, Insight and Judgment appropriate.    Izora Ribas, NP 2:45 PM [facility]

## 2020-01-21 ENCOUNTER — Encounter: Payer: Self-pay | Admitting: Internal Medicine

## 2020-01-21 ENCOUNTER — Other Ambulatory Visit: Payer: Self-pay

## 2020-01-21 ENCOUNTER — Ambulatory Visit (INDEPENDENT_AMBULATORY_CARE_PROVIDER_SITE_OTHER): Payer: Medicare HMO | Admitting: Adult Health

## 2020-01-21 ENCOUNTER — Encounter: Payer: Self-pay | Admitting: Adult Health

## 2020-01-21 VITALS — BP 120/74 | HR 81 | Temp 97.3°F | Wt 95.5 lb

## 2020-01-21 DIAGNOSIS — I10 Essential (primary) hypertension: Secondary | ICD-10-CM

## 2020-01-21 DIAGNOSIS — E039 Hypothyroidism, unspecified: Secondary | ICD-10-CM

## 2020-01-21 DIAGNOSIS — E559 Vitamin D deficiency, unspecified: Secondary | ICD-10-CM | POA: Diagnosis not present

## 2020-01-21 DIAGNOSIS — Z79899 Other long term (current) drug therapy: Secondary | ICD-10-CM | POA: Diagnosis not present

## 2020-01-21 DIAGNOSIS — G4700 Insomnia, unspecified: Secondary | ICD-10-CM | POA: Diagnosis not present

## 2020-01-21 DIAGNOSIS — E782 Mixed hyperlipidemia: Secondary | ICD-10-CM | POA: Diagnosis not present

## 2020-01-21 DIAGNOSIS — G43809 Other migraine, not intractable, without status migrainosus: Secondary | ICD-10-CM

## 2020-01-21 DIAGNOSIS — K219 Gastro-esophageal reflux disease without esophagitis: Secondary | ICD-10-CM

## 2020-01-21 MED ORDER — FAMOTIDINE 20 MG PO TABS: ORAL_TABLET | ORAL | 1 refills | Status: DC

## 2020-01-21 NOTE — Patient Instructions (Addendum)
Goals    . DIET - INCREASE WATER INTAKE     Increase fluids aiming for 55-65+ fluid ounces daily.     . Exercise 150 min/wk Moderate Activity     30 min x 5 days a week    . LDL CALC < 100        Try taking famotidine (pepcid) at night - may help with morning acid reflux    Gastroesophageal Reflux Disease, Adult Gastroesophageal reflux (GER) happens when acid from the stomach flows up into the tube that connects the mouth and the stomach (esophagus). Normally, food travels down the esophagus and stays in the stomach to be digested. However, when a person has GER, food and stomach acid sometimes move back up into the esophagus. If this becomes a more serious problem, the person may be diagnosed with a disease called gastroesophageal reflux disease (GERD). GERD occurs when the reflux:  Happens often.  Causes frequent or severe symptoms.  Causes problems such as damage to the esophagus. When stomach acid comes in contact with the esophagus, the acid may cause soreness (inflammation) in the esophagus. Over time, GERD may create small holes (ulcers) in the lining of the esophagus. What are the causes? This condition is caused by a problem with the muscle between the esophagus and the stomach (lower esophageal sphincter, or LES). Normally, the LES muscle closes after food passes through the esophagus to the stomach. When the LES is weakened or abnormal, it does not close properly, and that allows food and stomach acid to go back up into the esophagus. The LES can be weakened by certain dietary substances, medicines, and medical conditions, including:  Tobacco use.  Pregnancy.  Having a hiatal hernia.  Alcohol use.  Certain foods and beverages, such as coffee, chocolate, onions, and peppermint. What increases the risk? You are more likely to develop this condition if you:  Have an increased body weight.  Have a connective tissue disorder.  Use NSAID medicines. What are the  signs or symptoms? Symptoms of this condition include:  Heartburn.  Difficult or painful swallowing.  The feeling of having a lump in the throat.  Abitter taste in the mouth.  Bad breath.  Having a large amount of saliva.  Having an upset or bloated stomach.  Belching.  Chest pain. Different conditions can cause chest pain. Make sure you see your health care provider if you experience chest pain.  Shortness of breath or wheezing.  Ongoing (chronic) cough or a night-time cough.  Wearing away of tooth enamel.  Weight loss. How is this diagnosed? Your health care provider will take a medical history and perform a physical exam. To determine if you have mild or severe GERD, your health care provider may also monitor how you respond to treatment. You may also have tests, including:  A test to examine your stomach and esophagus with a small camera (endoscopy).  A test thatmeasures the acidity level in your esophagus.  A test thatmeasures how much pressure is on your esophagus.  A barium swallow or modified barium swallow test to show the shape, size, and functioning of your esophagus. How is this treated? The goal of treatment is to help relieve your symptoms and to prevent complications. Treatment for this condition may vary depending on how severe your symptoms are. Your health care provider may recommend:  Changes to your diet.  Medicine.  Surgery. Follow these instructions at home: Eating and drinking   Follow a diet as recommended by  your health care provider. This may involve avoiding foods and drinks such as: ? Coffee and tea (with or without caffeine). ? Drinks that containalcohol. ? Energy drinks and sports drinks. ? Carbonated drinks or sodas. ? Chocolate and cocoa. ? Peppermint and mint flavorings. ? Garlic and onions. ? Horseradish. ? Spicy and acidic foods, including peppers, chili powder, curry powder, vinegar, hot sauces, and barbecue  sauce. ? Citrus fruit juices and citrus fruits, such as oranges, lemons, and limes. ? Tomato-based foods, such as red sauce, chili, salsa, and pizza with red sauce. ? Fried and fatty foods, such as donuts, french fries, potato chips, and high-fat dressings. ? High-fat meats, such as hot dogs and fatty cuts of red and white meats, such as rib eye steak, sausage, ham, and bacon. ? High-fat dairy items, such as whole milk, butter, and cream cheese.  Eat small, frequent meals instead of large meals.  Avoid drinking large amounts of liquid with your meals.  Avoid eating meals during the 2-3 hours before bedtime.  Avoid lying down right after you eat.  Do not exercise right after you eat. Lifestyle   Do not use any products that contain nicotine or tobacco, such as cigarettes, e-cigarettes, and chewing tobacco. If you need help quitting, ask your health care provider.  Try to reduce your stress by using methods such as yoga or meditation. If you need help reducing stress, ask your health care provider.  If you are overweight, reduce your weight to an amount that is healthy for you. Ask your health care provider for guidance about a safe weight loss goal. General instructions  Pay attention to any changes in your symptoms.  Take over-the-counter and prescription medicines only as told by your health care provider. Do not take aspirin, ibuprofen, or other NSAIDs unless your health care provider told you to do so.  Wear loose-fitting clothing. Do not wear anything tight around your waist that causes pressure on your abdomen.  Raise (elevate) the head of your bed about 6 inches (15 cm).  Avoid bending over if this makes your symptoms worse.  Keep all follow-up visits as told by your health care provider. This is important. Contact a health care provider if:  You have: ? New symptoms. ? Unexplained weight loss. ? Difficulty swallowing or it hurts to swallow. ? Wheezing or a persistent  cough. ? A hoarse voice.  Your symptoms do not improve with treatment. Get help right away if you:  Have pain in your arms, neck, jaw, teeth, or back.  Feel sweaty, dizzy, or light-headed.  Have chest pain or shortness of breath.  Vomit and your vomit looks like blood or coffee grounds.  Faint.  Have stool that is bloody or black.  Cannot swallow, drink, or eat. Summary  Gastroesophageal reflux happens when acid from the stomach flows up into the esophagus. GERD is a disease in which the reflux happens often, causes frequent or severe symptoms, or causes problems such as damage to the esophagus.  Treatment for this condition may vary depending on how severe your symptoms are. Your health care provider may recommend diet and lifestyle changes, medicine, or surgery.  Contact a health care provider if you have new or worsening symptoms.  Take over-the-counter and prescription medicines only as told by your health care provider. Do not take aspirin, ibuprofen, or other NSAIDs unless your health care provider told you to do so.  Keep all follow-up visits as told by your health care provider. This  is important. This information is not intended to replace advice given to you by your health care provider. Make sure you discuss any questions you have with your health care provider. Document Revised: 01/23/2018 Document Reviewed: 01/23/2018 Elsevier Patient Education  [place].

## 2020-01-22 ENCOUNTER — Other Ambulatory Visit: Payer: Self-pay | Admitting: Adult Health

## 2020-01-22 LAB — COMPLETE METABOLIC PANEL WITH GFR
AG Ratio: 1.9 (calc) (ref 1.0–2.5)
ALT: 11 U/L (ref 6–29)
AST: 23 U/L (ref 10–35)
Albumin: 4.6 g/dL (ref 3.6–5.1)
Alkaline phosphatase (APISO): 25 U/L — ABNORMAL LOW (ref 37–153)
BUN: 19 mg/dL (ref 7–25)
CO2: 29 mmol/L (ref 20–32)
Calcium: 9.9 mg/dL (ref 8.6–10.4)
Chloride: 101 mmol/L (ref 98–110)
Creat: 0.96 mg/dL (ref 0.50–0.99)
GFR, Est African American: 72 mL/min/{1.73_m2} (ref >=60)
GFR, Est Non African American: 62 mL/min/{1.73_m2} (ref >=60)
Globulin: 2.4 g/dL (calc) (ref 1.9–3.7)
Glucose, Bld: 123 mg/dL — ABNORMAL HIGH (ref 65–99)
Potassium: 3.8 mmol/L (ref 3.5–5.3)
Sodium: 138 mmol/L (ref 135–146)
Total Bilirubin: 0.4 mg/dL (ref 0.2–1.2)
Total Protein: 7 g/dL (ref 6.1–8.1)

## 2020-01-22 LAB — CBC WITH DIFFERENTIAL/PLATELET
Absolute Monocytes: 729 cells/uL (ref 200–950)
Basophils Absolute: 27 cells/uL (ref 0–200)
Basophils Relative: 0.3 %
Eosinophils Absolute: 54 cells/uL (ref 15–500)
Eosinophils Relative: 0.6 %
HCT: 44 % (ref 35.0–45.0)
Hemoglobin: 14.5 g/dL (ref 11.7–15.5)
Lymphs Abs: 1854 cells/uL (ref 850–3900)
MCH: 28.8 pg (ref 27.0–33.0)
MCHC: 33 g/dL (ref 32.0–36.0)
MCV: 87.5 fL (ref 80.0–100.0)
MPV: 10.4 fL (ref 7.5–12.5)
Monocytes Relative: 8.1 %
Neutro Abs: 6336 cells/uL (ref 1500–7800)
Neutrophils Relative %: 70.4 %
Platelets: 275 10*3/uL (ref 140–400)
RBC: 5.03 10*6/uL (ref 3.80–5.10)
RDW: 13.1 % (ref 11.0–15.0)
Total Lymphocyte: 20.6 %
WBC: 9 10*3/uL (ref 3.8–10.8)

## 2020-01-22 LAB — LIPID PANEL
Cholesterol: 182 mg/dL (ref <200)
HDL: 78 mg/dL (ref >=50)
LDL Cholesterol (Calc): 87 mg/dL (calc)
Non-HDL Cholesterol (Calc): 104 mg/dL (calc) (ref <130)
Total CHOL/HDL Ratio: 2.3 (calc) (ref <5.0)
Triglycerides: 80 mg/dL (ref <150)

## 2020-01-22 LAB — TSH: TSH: 0.81 mIU/L (ref 0.40–4.50)

## 2020-01-22 LAB — VITAMIN D 25 HYDROXY (VIT D DEFICIENCY, FRACTURES): Vit D, 25-Hydroxy: 41 ng/mL (ref 30–100)

## 2020-01-22 LAB — MAGNESIUM: Magnesium: 2.3 mg/dL (ref 1.5–2.5)

## 2020-01-22 MED ORDER — CHOLECALCIFEROL 25 MCG (1000 UT) PO CAPS: 1000.0000 [IU] | ORAL_CAPSULE | Freq: Every day | ORAL | Status: AC

## 2020-03-01 DIAGNOSIS — R69 Illness, unspecified: Secondary | ICD-10-CM | POA: Diagnosis not present

## 2020-03-02 DIAGNOSIS — G4700 Insomnia, unspecified: Secondary | ICD-10-CM | POA: Diagnosis not present

## 2020-03-02 DIAGNOSIS — R413 Other amnesia: Secondary | ICD-10-CM | POA: Diagnosis not present

## 2020-03-02 DIAGNOSIS — G43709 Chronic migraine without aura, not intractable, without status migrainosus: Secondary | ICD-10-CM | POA: Diagnosis not present

## 2020-03-25 ENCOUNTER — Other Ambulatory Visit: Payer: Self-pay | Admitting: Internal Medicine

## 2020-03-25 DIAGNOSIS — G4700 Insomnia, unspecified: Secondary | ICD-10-CM

## 2020-03-25 MED ORDER — TRAZODONE HCL 50 MG PO TABS: ORAL_TABLET | ORAL | 0 refills | Status: DC

## 2020-03-28 NOTE — Patient Instructions (Signed)
Recurrent Migraine Headache ° °Migraines are a type of headache, and they are usually stronger and more sudden than normal headaches (tension headaches). Migraines are characterized by an intense pulsing, throbbing pain that is usually only present on one side of the head. Sometimes, migraine headaches can cause nausea, vomiting, sensitivity to light and sound, and vision changes. Recurrent migraines keep coming back (recurring). A migraine can last from 4 hours up to 3 days. °What are the causes? °The exact cause of this condition is not known. However, a migraine may be caused when nerves in the brain become irritated and release chemicals that cause inflammation of blood vessels. This inflammation causes pain. °Certain things may also trigger migraines, such as: °· A disruption in your regular eating and sleeping schedule. °· Smoking. °· Stress. °· Menstruation. °· Certain foods and drinks, such as: °? Aged cheese. °? Chocolate. °? Alcohol. °? Caffeine. °? Foods or drinks that contain nitrates, glutamate, aspartame, MSG, or tyramine. °· Lack of sleep. °· Hunger. °· Physical exertion. °· Fatigue. °· High altitude. °· Weather changes. °· Medicines, such as: °? Nitroglycerin, which is used to treat chest pain. °? Birth control pills. °? Estrogen. °? Some blood pressure medicines. °What are the signs or symptoms? °Symptoms of this condition vary for each person and may include: °· Pain that is usually only present on one side of the head. In some cases, the pain may be on both sides of the head or around the head or neck. °· Pulsating or throbbing pain. °· Severe pain that prevents daily activities. °· Pain that is aggravated by any physical activity. °· Nausea, vomiting, or both. °· Dizziness. °· Pain with exposure to bright lights, loud noises, or activity. °· General sensitivity to bright lights, loud noises, or smells. °Before you get a migraine, you may get warning signs that a migraine is coming (aura). An aura  may include: °· Seeing flashing lights. °· Seeing bright spots, halos, or zigzag lines. °· Having tunnel vision or blurred vision. °· Having numbness or a tingling feeling. °· Having trouble talking. °· Having muscle weakness. °· Smelling a certain odor. °How is this diagnosed? °This condition is often diagnosed based on: °· Your symptoms and medical history. °· A physical exam. °You may also have tests, including: °· A CT scan or MRI of your brain. These imaging tests cannot diagnose migraines, but they can help to rule out other causes of headaches. °· Blood tests. °How is this treated? °This condition is treated with: °· Medicines. These are used for: °? Lessening pain and nausea. °? Preventing recurrent migraines. °· Lifestyle changes, such as changes to your diet or sleeping patterns. °· Behavior therapy, such as relaxation training or biofeedback. Biofeedback is a treatment that involves teaching you to relax and use your brain to lower your heart rate and control your breathing. °Follow these instructions at home: °Medicines °· Take over-the-counter and prescription medicines only as told by your health care provider. °· Do not drive or use heavy machinery while taking prescription pain medicine. °Lifestyle °· Do not use any products that contain nicotine or tobacco, such as cigarettes and e-cigarettes. If you need help quitting, ask your health care provider. °· Limit alcohol intake to no more than 1 drink a day for nonpregnant women and 2 drinks a day for men. One drink equals 12 oz of beer, 5 oz of wine, or 1½ oz of hard liquor. °· Get 7-9 hours of sleep each night, or the amount of   sleep recommended by your health care provider. °· Limit your stress. Talk with your health care provider if you need help with stress management. °· Maintain a healthy weight. If you need help losing weight, ask your health care provider. °· Exercise regularly. Aim for 150 minutes of moderate-intensity exercise (walking,  biking, yoga) or 75 minutes of vigorous exercise (running, circuit training, swimming) each week. °General instructions ° °· Keep a journal to find out what triggers your migraine headaches so you can avoid these triggers. For example, write down: °? What you eat and drink. °? How much sleep you get. °? Any change to your diet or medicines. °· Lie down in a dark, quiet room when you have a migraine. °· Try placing a cool towel over your head when you have a migraine. °· Keep lights dim, if bright lights bother you and make your migraines worse. °· Keep all follow-up visits as told by your health care provider. This is important. °Contact a health care provider if: °· Your pain does not improve, even with medicine. °· Your migraines continue to return, even with medicine. °· You have a fever. °· You have weight loss. °Get help right away if: °· Your migraine becomes severe and medicine does not help. °· You have a stiff neck. °· You have a loss of vision. °· You have muscle weakness or loss of muscle control. °· You start losing your balance or have trouble walking. °· You feel faint or you pass out. °· You develop new, severe symptoms. °· You start having abrupt severe headaches that last for a second or less, like a thunderclap. °Summary °· Migraine headaches are usually stronger and more sudden than normal headaches (tension headaches). Migraines are characterized by an intense pulsing, throbbing pain that is usually only present on one side of the head. °· The exact cause of this condition is not known. However, a migraine may be caused when nerves in the brain become irritated and release chemicals that cause inflammation of blood vessels. °· Certain things may trigger migraines, such as changes to diet or sleeping patterns, smoking, certain foods, alcohol, stress, and certain medicines. °· Sometimes, migraine headaches can cause nausea, vomiting, sensitivity to light and sound, and vision changes. °· Migraines  are often diagnosed based on your symptoms, medical history, and a physical exam. °This information is not intended to replace advice given to you by your health care provider. Make sure you discuss any questions you have with your health care provider. °Document Revised: 07/20/2017 Document Reviewed: 04/28/2016 °Elsevier Patient Education © 2020 [employer] ° °

## 2020-03-28 NOTE — Progress Notes (Signed)
History of Present Illness:     Patient is a very nice 64 yo [name] with hx/o labile HTN and chronic Vascular HA's,  HLD,  GERD, Hypothyroidism, Osteoporosis  and Vitamin D Deficiency. Patient is also followed at [facility] for chronic  shoulder pains.         Her new Neurologist at [facility] for her chronic HA's advised stopping Diazepam and replacing with Doxepin & Atenolol. Patient wanted to try Trazodone for her sleep on which her husband has had good success coming off of chronic Alprazolam. She reports intolerance to Doxepin / Sinequan. She also desires to stop the Atenolol and resume her Amlodipine 5 mg.      Medications  Current Outpatient Medications (Endocrine & Metabolic):  .  denosumab (PROLIA) 60 MG/ML SOSY injection, Inject 60 mg into the skin every 6 (six) months. .  levothyroxine (SYNTHROID) 25 MCG tablet, Take 1 tablet daily on an empty stomach with only water for 30 minutes & no Antacid meds, Calcium or Magnesium for 4 hours & avoid Biotin  Current Outpatient Medications (Cardiovascular):  .  rosuvastatin (CRESTOR) 20 MG tablet, Take 1 tablet Daily for Cholesterol .  amLODipine (NORVASC) 5 MG tablet, TAKE 1 TABLET BY MOUTH EVERY DAY (Patient not taking: Reported on 03/29/2020)   Current Outpatient Medications (Analgesics):  .  zolmitriptan (ZOMIG-ZMT) 2.5 MG disintegrating tablet, DIS ONE T PO UTD PRF MIGRAINE .  AIMOVIG 140 MG/ML SOAJ,    Current Outpatient Medications (Other):  .  famotidine (PEPCID) 20 MG tablet, Take 1 tab at bedtime daily for reflux. .  metaxalone (SKELAXIN) 800 MG tablet, Take 800 mg by mouth as needed.  .  Cholecalciferol 25 MCG (1000 UT) capsule, Take 1 capsule (1,000 Units total) by mouth daily. (Patient not taking: Reported on 03/29/2020) .  diazepam (VALIUM) 10 MG tablet, Take 5 mg by mouth at bedtime as needed for anxiety.  (Patient not taking: Reported on 03/29/2020) .  traZODone (DESYREL) 50 MG tablet, Take 1/2 tab up to 1 tab, 2 or 3  tablets    1 hour before Bedtime for Sleep (Patient not taking: Reported on 03/29/2020)  Problem list She has Recurrent acute sinusitis; Hypertension; Hyperlipidemia; GERD (gastroesophageal reflux disease); Cervical spondylosis without myelopathy; Headache, migraine; History of prediabetes; Vitamin D deficiency; Medication management; Age-related osteoporosis without current pathological fracture; Hypothyroidism; Insomnia; and Right bundle branch block (RBBB) on electrocardiogram (ECG) on their problem list.   Observations/Objective:   BP 128/82   Pulse 72   Temp 97.9 F (36.6 C)   Wt 90 lb (40.8 kg)   SpO2 98%   BMI 17.29 kg/m   HEENT - WNL. Neck - supple.  Chest - Clear equal BS. Cor - Nl HS. RRR w/o sig MGR. PP 1(+). No edema. MS- FROM w/o deformities.  Gait Nl. Neuro -  Nl w/o focal abnormalities.  Assessment and Plan:  1. Essential hypertension  - amLODipine (NORVASC) 5 MG tablet; Take 1 tablet     Daily     to Prevent Migraines  Dispense: 90 tablet; Refill: 0  2. Chronic migraine without aura without status migrainosus, not intractable  - amLODipine  5 MG; Take 1 tab    Daily     to Prevent Migraines  Disp: 90 tab;   3. Insomnia  - traZODone  50 MG; Take 1/2 tab up to 1 tab, 2 or 3 tablets 1 hr before Bedtime  Disp: 90 tab   Follow Up Instructions:  I discussed the assessment and treatment plan with the patient & husband. The patient was provided an opportunity to ask questions and all were answered. The patient agreed with the plan and demonstrated an understanding of the instructions.      The patient  & husband were advised to call back or seek an in-person evaluation if the symptoms worsen or if the condition fails to improve as anticipated.   Kirtland Bouchard, MD

## 2020-03-29 ENCOUNTER — Encounter: Payer: Self-pay | Admitting: Internal Medicine

## 2020-03-29 ENCOUNTER — Ambulatory Visit (INDEPENDENT_AMBULATORY_CARE_PROVIDER_SITE_OTHER): Payer: Medicare HMO | Admitting: Internal Medicine

## 2020-03-29 ENCOUNTER — Other Ambulatory Visit: Payer: Self-pay

## 2020-03-29 VITALS — BP 128/82 | HR 72 | Temp 97.9°F | Ht 61.0 in | Wt 90.0 lb

## 2020-03-29 DIAGNOSIS — G4700 Insomnia, unspecified: Secondary | ICD-10-CM | POA: Diagnosis not present

## 2020-03-29 DIAGNOSIS — G43709 Chronic migraine without aura, not intractable, without status migrainosus: Secondary | ICD-10-CM

## 2020-03-29 DIAGNOSIS — I10 Essential (primary) hypertension: Secondary | ICD-10-CM | POA: Diagnosis not present

## 2020-03-29 MED ORDER — AMLODIPINE BESYLATE 5 MG PO TABS: ORAL_TABLET | ORAL | 0 refills | Status: DC

## 2020-03-29 MED ORDER — TRAZODONE HCL 50 MG PO TABS: ORAL_TABLET | ORAL | 0 refills | Status: DC

## 2020-04-02 DIAGNOSIS — Z01419 Encounter for gynecological examination (general) (routine) without abnormal findings: Secondary | ICD-10-CM | POA: Diagnosis not present

## 2020-04-02 DIAGNOSIS — Z975 Presence of (intrauterine) contraceptive device: Secondary | ICD-10-CM | POA: Diagnosis not present

## 2020-04-02 DIAGNOSIS — Z1231 Encounter for screening mammogram for malignant neoplasm of breast: Secondary | ICD-10-CM | POA: Diagnosis not present

## 2020-04-02 LAB — HM MAMMOGRAPHY

## 2020-04-28 ENCOUNTER — Encounter: Payer: Self-pay | Admitting: Internal Medicine

## 2020-04-28 ENCOUNTER — Other Ambulatory Visit: Payer: Self-pay

## 2020-04-28 ENCOUNTER — Ambulatory Visit (INDEPENDENT_AMBULATORY_CARE_PROVIDER_SITE_OTHER): Payer: Medicare HMO | Admitting: Internal Medicine

## 2020-04-28 ENCOUNTER — Other Ambulatory Visit: Payer: Self-pay | Admitting: *Deleted

## 2020-04-28 VITALS — BP 118/82 | HR 73 | Temp 97.0°F | Resp 16 | Ht 60.5 in | Wt 91.4 lb

## 2020-04-28 DIAGNOSIS — E782 Mixed hyperlipidemia: Secondary | ICD-10-CM

## 2020-04-28 DIAGNOSIS — E039 Hypothyroidism, unspecified: Secondary | ICD-10-CM | POA: Diagnosis not present

## 2020-04-28 DIAGNOSIS — I10 Essential (primary) hypertension: Secondary | ICD-10-CM

## 2020-04-28 DIAGNOSIS — R634 Abnormal weight loss: Secondary | ICD-10-CM | POA: Diagnosis not present

## 2020-04-28 DIAGNOSIS — K2960 Other gastritis without bleeding: Secondary | ICD-10-CM

## 2020-04-28 MED ORDER — ROSUVASTATIN CALCIUM 20 MG PO TABS: ORAL_TABLET | ORAL | 1 refills | Status: DC

## 2020-04-28 MED ORDER — ESOMEPRAZOLE MAGNESIUM 40 MG PO CPDR: DELAYED_RELEASE_CAPSULE | ORAL | 1 refills | Status: DC

## 2020-04-28 MED ORDER — MIRTAZAPINE 15 MG PO TABS: ORAL_TABLET | ORAL | 1 refills | Status: DC

## 2020-04-28 NOTE — Progress Notes (Signed)
History of Present Illness:     This very nice 64 yo [name] is brought in today by her spouse for concern re: weight loss. Patient relates that she has no appetite , some nausea and slight EG heartburn. A second problem is "memory issues" . Husband relates she has poor short term memory issues. Apparently had also seen Dr Reggy Eye at [facility] for same complaints. Apparently , she never took the mirtazapine that he prescribed her.    Wt Readings from Last 3 Encounters:  04/28/20 91 lb 6.4 oz (41.5 kg)  03/29/20 90 lb (40.8 kg)  01/21/20 95 lb 8 oz (43.3 kg)   Medications  .  denosumab (PROLIA) 60 MG/ML SOSY injection, Inject 60 mg into the skin every 6 (six) months. .  levothyroxine (SYNTHROID) 25 MCG tablet, Take 1 tablet daily on an empty stomach with only water for 30 minutes & no Antacid meds, Calcium or Magnesium for 4 hours & avoid Biotin .  amLODipine (NORVASC) 5 MG tablet, Take 1 tablet     Daily     to Prevent Migraines .  rosuvastatin (CRESTOR) 20 MG tablet, Take 1 tablet Daily for Cholesterol .  AIMOVIG 140 MG/ML SOAJ,  .  zolmitriptan (ZOMIG-ZMT) 2.5 MG disintegrating tablet, DIS ONE T PO UTD PRF MIGRAINE .  Cholecalciferol 25 MCG (1000 UT) capsule, Take 1 capsule (1,000 Units total) by mouth daily. (Patient not taking: Reported on 03/29/2020) .  diazepam (VALIUM) 10 MG tablet, Take 5 mg by mouth at bedtime as needed for anxiety.  (Patient not taking: Reported on 03/29/2020) .  famotidine (PEPCID) 20 MG tablet, Take 1 tab at bedtime daily for reflux. .  metaxalone (SKELAXIN) 800 MG tablet, Take 800 mg by mouth as needed.  .  traZODone (DESYREL) 50 MG tablet, Take 1/2 tab up to 1 tab, 2 or 3 tablets    1 hour before Bedtime for Sleep  Problem list She has Recurrent acute sinusitis; Hypertension; Hyperlipidemia; GERD (gastroesophageal reflux disease); Cervical spondylosis without myelopathy; Headache, migraine; History of prediabetes; Vitamin D deficiency;  Medication management; Age-related osteoporosis without current pathological fracture; Hypothyroidism; Insomnia; and Right bundle branch block (RBBB) on electrocardiogram (ECG) on their problem list.   Observations/Objective:  BP 118/82   Pulse 73   Temp (!) 97 F (36.1 C)   Resp 16   Ht 5' 0.5" (1.537 m)   Wt 91 lb 6.4 oz (41.5 kg)   SpO2 99%   BMI 17.56 kg/m   HEENT - WNL. Neck - supple.  Chest - Clear equal BS. Cor - Nl HS. RRR w/o sig MGR. PP 1(+). No edema. Abd - Slight EG tenderness w/o guarding or rebound.  MS- FROM w/o deformities.  Gait Nl. Neuro -  Nl w/o focal abnormalities.  Assessment and Plan:  1. Weight loss, unintentional  - CBC with Differential/Platelet - COMPLETE METABOLIC PANEL WITH GFR - TSH - Cortisol - Sedimentation rate - C-reactive protein  - mirtazapine  15 MG tab; Take 1/2 to 1 tablet  1 hour Before Bedtime for Sleep & Appetite  Disp: 90; Rf: 1  2. Essential hypertension  - CBC with Differential/Platelet - COMPLETE METABOLIC PANEL WITH GFR - TSH  3. Hypothyroidism  - TSH  4. Other gastritis, suspected  - esomeprazole (NEXIUM) 40 MG capsule; Take     1 capsule     Daily        for Stomach Acid  Dispense: 30 capsule; Refill: 1  - Discussed  diet   Follow Up Instructions: Recc ROV 1 month to reassess       I discussed the assessment and treatment plan with the patient & spouse. They were provided an opportunity to ask questions and all were answered. They agreed with the plan and demonstrated an understanding of the instructions.   They were advised to call back or seek an in-person evaluation if the symptoms worsen or if the condition fails to improve as anticipated.   Kirtland Bouchard, MD

## 2020-04-29 ENCOUNTER — Other Ambulatory Visit: Payer: Self-pay | Admitting: Internal Medicine

## 2020-04-29 DIAGNOSIS — G4700 Insomnia, unspecified: Secondary | ICD-10-CM

## 2020-04-29 LAB — CBC WITH DIFFERENTIAL/PLATELET
Absolute Monocytes: 789 cells/uL (ref 200–950)
Basophils Absolute: 48 cells/uL (ref 0–200)
Basophils Relative: 0.5 %
Eosinophils Absolute: 57 cells/uL (ref 15–500)
Eosinophils Relative: 0.6 %
HCT: 41.8 % (ref 35.0–45.0)
Hemoglobin: 13.9 g/dL (ref 11.7–15.5)
Lymphs Abs: 2499 cells/uL (ref 850–3900)
MCH: 29.6 pg (ref 27.0–33.0)
MCHC: 33.3 g/dL (ref 32.0–36.0)
MCV: 88.9 fL (ref 80.0–100.0)
MPV: 10.4 fL (ref 7.5–12.5)
Monocytes Relative: 8.3 %
Neutro Abs: 6109 cells/uL (ref 1500–7800)
Neutrophils Relative %: 64.3 %
Platelets: 292 10*3/uL (ref 140–400)
RBC: 4.7 10*6/uL (ref 3.80–5.10)
RDW: 13.3 % (ref 11.0–15.0)
Total Lymphocyte: 26.3 %
WBC: 9.5 10*3/uL (ref 3.8–10.8)

## 2020-04-29 LAB — COMPLETE METABOLIC PANEL WITH GFR
AG Ratio: 2.2 (calc) (ref 1.0–2.5)
ALT: 11 U/L (ref 6–29)
AST: 21 U/L (ref 10–35)
Albumin: 4.6 g/dL (ref 3.6–5.1)
Alkaline phosphatase (APISO): 22 U/L — ABNORMAL LOW (ref 37–153)
BUN: 17 mg/dL (ref 7–25)
CO2: 29 mmol/L (ref 20–32)
Calcium: 9.6 mg/dL (ref 8.6–10.4)
Chloride: 101 mmol/L (ref 98–110)
Creat: 0.88 mg/dL (ref 0.50–0.99)
GFR, Est African American: 80 mL/min/{1.73_m2} (ref >=60)
GFR, Est Non African American: 69 mL/min/{1.73_m2} (ref >=60)
Globulin: 2.1 g/dL (calc) (ref 1.9–3.7)
Glucose, Bld: 87 mg/dL (ref 65–99)
Potassium: 4.3 mmol/L (ref 3.5–5.3)
Sodium: 138 mmol/L (ref 135–146)
Total Bilirubin: 0.5 mg/dL (ref 0.2–1.2)
Total Protein: 6.7 g/dL (ref 6.1–8.1)

## 2020-04-29 LAB — CORTISOL: Cortisol, Plasma: 7.8 ug/dL

## 2020-04-29 LAB — C-REACTIVE PROTEIN: CRP: 1.4 mg/L (ref <8.0)

## 2020-04-29 LAB — SEDIMENTATION RATE: Sed Rate: 6 mm/h (ref 0–30)

## 2020-04-29 LAB — TSH: TSH: 1.34 mIU/L (ref 0.40–4.50)

## 2020-04-29 NOTE — Progress Notes (Signed)
========================================================== -   Test results slightly outside the reference range are not unusual. If there is anything important, I will review this with you,  otherwise it is considered normal test values.  If you have further questions,  please do not hesitate to contact me at the office or via My Chart.  ==========================================================  -  CBC - Normal red cell count -No Anemia &               - WBC - Normal  - No Infection ==========================================================  -  Thyroid - Normal -  ==========================================================  -  Both ESR / Sed rate & CRP  (( tests for inflammatory conditions  - like cancers, autoimmune diseases or infections ))    are both Normal & OK   ==========================================================  -  All Else - Kidneys - Electrolytes - Liver   - all  Normal / OK ==========================================================

## 2020-05-30 ENCOUNTER — Encounter: Payer: Self-pay | Admitting: Internal Medicine

## 2020-05-30 NOTE — Progress Notes (Signed)
   History of Present Illness:      Patient is a nice 64 yo [name] returning for 1 month f/u of weight loss/. Labs were done and were unrevealing. She was advised to start the  that Dr Reggy Eye at [facility] had recently prescribed. She was also prescribed Nexium for Pyrosis sx's not controlled on Famotidine.       Patient relates that she stopped her Mirtazapine about 5 days ago (after 3 weeks) as she felt that it gave her Insomnia and since she regained 5#, she's  afraid of  "getting Fat'.  Her husband also reports that she seems now to be eating lager portion sizes. They both relate that since she stopped eating Bananas that her pyrosis has resolved w/u the Nexium.       She has parted ways from her previous neurologist and asked if we would refill her Migraine Rx's & I assured her that we would.   Wt Readings from Last 3 Encounters:  05/31/20 96 lb (43.5 kg)  04/28/20 91 lb 6.4 oz (41.5 kg)  03/29/20 90 lb (40.8 kg)    Medications  .  PROLIA, Inject 60 mg  every 6  months. .  levothyroxine 25 MCG tab, Take 1 tablet daily  .  amLODipine  5 MG tab, Take 1 tablet     Daily     to Prevent Migraines .  rosuvastatin (20 MG tablet, Take 1 tablet Daily for Cholesterol  .  zolmitriptan (ZOMIG-ZMT) 2.5 MG ODT, 1 tab SL PRF MIGRAINE .  Vitamin D 1000 U cap, Take 1 cap daily. Marland Kitchen  esomeprazole 40 MG capsule, Take     1 capsule     Daily        for Stomach Acid .  mirtazapine  15 MG tablet, Take     1/2 to 1 tablet       1 hour     Before Bedtime      for Sleep & Appetite  Problem list She has Recurrent acute sinusitis; Hypertension; Hyperlipidemia; GERD (gastroesophageal reflux disease); Cervical spondylosis without myelopathy; Headache, migraine; History of prediabetes; Vitamin D deficiency; Medication management; Age-related osteoporosis without current pathological fracture; Hypothyroidism; Insomnia; and Right bundle branch block (RBBB) on electrocardiogram (ECG) on their problem  list.   Observations/Objective:   BP 122/80   Pulse 75   Temp (!) 97.2 F (36.2 C)   Resp 16   Ht 5' 0.5" (1.537 m)   Wt 96 lb (43.5 kg)   BMI 18.44 kg/m   Appears Comfortable, Relaxed & in no Distress.  No formal Exam today - just extended discussion re: her diet /weight, Migraines, insomnia, Pyrosis.   Assessment and Plan:  1. Weight loss, unintentional  2. Gastroesophageal reflux disease, unspecified whether esophagitis present  3. Chronic migraine without aura without status migrainosus, not intractable   Follow Up Instructions:        I discussed the assessment and treatment plan with the patient. The patient was provided an opportunity to ask questions and all were answered. The patient agreed with the plan and demonstrated an understanding of the instructions. Between 15-20 minutes of counseling, chart review, and critical decision making was performed        The patient was advised to call back or seek an in-person evaluation if the symptoms worsen or if her condition fails to improve as anticipated.   Kirtland Bouchard, MD

## 2020-05-30 NOTE — Patient Instructions (Signed)

## 2020-05-31 ENCOUNTER — Other Ambulatory Visit: Payer: Self-pay

## 2020-05-31 ENCOUNTER — Ambulatory Visit (INDEPENDENT_AMBULATORY_CARE_PROVIDER_SITE_OTHER): Payer: Medicare HMO | Admitting: Internal Medicine

## 2020-05-31 VITALS — BP 122/80 | HR 75 | Temp 97.2°F | Resp 16 | Ht 60.5 in | Wt 96.0 lb

## 2020-05-31 DIAGNOSIS — R634 Abnormal weight loss: Secondary | ICD-10-CM | POA: Diagnosis not present

## 2020-05-31 DIAGNOSIS — G43709 Chronic migraine without aura, not intractable, without status migrainosus: Secondary | ICD-10-CM

## 2020-05-31 DIAGNOSIS — K219 Gastro-esophageal reflux disease without esophagitis: Secondary | ICD-10-CM

## 2020-06-02 ENCOUNTER — Encounter: Payer: Self-pay | Admitting: Internal Medicine

## 2020-06-06 ENCOUNTER — Other Ambulatory Visit: Payer: Self-pay | Admitting: Internal Medicine

## 2020-06-06 MED ORDER — TRAZODONE HCL 50 MG PO TABS
50.0000 mg | ORAL_TABLET | Freq: Every day | ORAL | 0 refills | Status: DC
Start: 2020-06-06 — End: 2020-07-21

## 2020-07-14 ENCOUNTER — Encounter: Payer: BLUE CROSS/BLUE SHIELD | Admitting: Adult Health

## 2020-07-21 ENCOUNTER — Ambulatory Visit (INDEPENDENT_AMBULATORY_CARE_PROVIDER_SITE_OTHER): Payer: Medicare HMO | Admitting: Adult Health

## 2020-07-21 ENCOUNTER — Encounter: Payer: Self-pay | Admitting: Adult Health

## 2020-07-21 ENCOUNTER — Other Ambulatory Visit: Payer: Self-pay

## 2020-07-21 VITALS — BP 132/84 | HR 65 | Temp 97.3°F | Ht 60.75 in | Wt 92.4 lb

## 2020-07-21 DIAGNOSIS — G43809 Other migraine, not intractable, without status migrainosus: Secondary | ICD-10-CM | POA: Diagnosis not present

## 2020-07-21 DIAGNOSIS — E559 Vitamin D deficiency, unspecified: Secondary | ICD-10-CM | POA: Diagnosis not present

## 2020-07-21 DIAGNOSIS — M47812 Spondylosis without myelopathy or radiculopathy, cervical region: Secondary | ICD-10-CM

## 2020-07-21 DIAGNOSIS — Z1159 Encounter for screening for other viral diseases: Secondary | ICD-10-CM

## 2020-07-21 DIAGNOSIS — Z131 Encounter for screening for diabetes mellitus: Secondary | ICD-10-CM | POA: Diagnosis not present

## 2020-07-21 DIAGNOSIS — Z1389 Encounter for screening for other disorder: Secondary | ICD-10-CM

## 2020-07-21 DIAGNOSIS — E782 Mixed hyperlipidemia: Secondary | ICD-10-CM

## 2020-07-21 DIAGNOSIS — I4510 Unspecified right bundle-branch block: Secondary | ICD-10-CM

## 2020-07-21 DIAGNOSIS — I10 Essential (primary) hypertension: Secondary | ICD-10-CM | POA: Diagnosis not present

## 2020-07-21 DIAGNOSIS — E039 Hypothyroidism, unspecified: Secondary | ICD-10-CM | POA: Diagnosis not present

## 2020-07-21 DIAGNOSIS — Z681 Body mass index (BMI) 19 or less, adult: Secondary | ICD-10-CM | POA: Insufficient documentation

## 2020-07-21 DIAGNOSIS — K219 Gastro-esophageal reflux disease without esophagitis: Secondary | ICD-10-CM | POA: Diagnosis not present

## 2020-07-21 DIAGNOSIS — Z0000 Encounter for general adult medical examination without abnormal findings: Secondary | ICD-10-CM

## 2020-07-21 DIAGNOSIS — Z79899 Other long term (current) drug therapy: Secondary | ICD-10-CM

## 2020-07-21 DIAGNOSIS — M810 Age-related osteoporosis without current pathological fracture: Secondary | ICD-10-CM

## 2020-07-21 DIAGNOSIS — Z87898 Personal history of other specified conditions: Secondary | ICD-10-CM

## 2020-07-21 DIAGNOSIS — Z136 Encounter for screening for cardiovascular disorders: Secondary | ICD-10-CM

## 2020-07-21 DIAGNOSIS — G4700 Insomnia, unspecified: Secondary | ICD-10-CM

## 2020-07-21 MED ORDER — TRAZODONE HCL 50 MG PO TABS: 75.0000 mg | ORAL_TABLET | Freq: Every day | ORAL | 1 refills | Status: DC

## 2020-07-21 NOTE — Progress Notes (Signed)
Complete Physical  Assessment and Plan:  Isabel [name] was seen today for annual exam.  Diagnoses and all orders for this visit:  Encounter for annual physical exam  Other osteoporosis without current pathological fracture -     VITAMIN D 25 Hydroxy (Vit-D Deficiency, Fractures) -     CBC with Differential/Platelet She is receiving Prolia Q62months via Dr. Cruzita Lederer and is taking Calcium and Vit D.  Hypothyroidism, unspecified type -     TSH continue medications the same, reminded to take on an empty stomach 30-24mins before food.  Will contact with results  Mixed hyperlipidemia -     Lipid panel Taking Crestor  decrease fatty foods increase activity.  Continue OTC Fish Oil  Other migraine without status migrainosus, not intractable Managed by neurology  She is receiving monthly Aimovig injections with good results Has Zolitriptan PRN  Hx of Prediabetes/screening diabetes -     Hemoglobin A1c  Vitamin D deficiency -     VITAMIN D 25 Hydroxy (Vit-D Deficiency, Fractures) Taking daily supplimentation, continue  Gastroesophageal reflux disease, esophagitis presence not specified Continue PPI/H2i; do food log to identify triggers Discussed diet, avoiding triggers and other lifestyle changes  Hypertension - continue medications, DASH diet, exercise and monitor at home. Call if greater than 130/80.  Patient to monitor at home and contact office if elevation is consistent        - EKG . RBBB on EKG IRBBB this year; continue to monitor   Medication management        -     Magnesium  -     TSH -     COMPLETE METABOLIC PANEL WITH GFR -     Urinalysis w microscopic + reflex cultur  BMI <19 Weight gain encouraged; boost, ensure, high protein calorie diet reviewed and recommended Aim to get back up to baseline 100 lb Check CMP/GFR   Orders Placed This Encounter  Procedures   CBC with Differential/Platelet   COMPLETE METABOLIC PANEL WITH GFR   Magnesium   Lipid panel    TSH   Hemoglobin A1c   VITAMIN D 25 Hydroxy (Vit-D Deficiency, Fractures)   Microalbumin / creatinine urine ratio   Urinalysis, Routine w reflex microscopic   Hepatitis C antibody   EKG 12-Lead     Discussed med's effects and SE's. Screening labs and tests as requested with regular follow-up as recommended. Over 40 minutes of exam, counseling, chart review, and complex, high level critical decision making was performed this visit.   Future Appointments  Date Time Provider [place]  10/27/2020  3:30 PM Liane Comber, NP [facility] None  11/25/2020  2:00 PM Philemon Kingdom, MD [facility] None  07/21/2021  3:00 PM Liane Comber, NP [facility] None     HPI  64 y.o. female  presents for a complete physical and follow up for has Hypertension; Hyperlipidemia; GERD (gastroesophageal reflux disease); Cervical spondylosis without myelopathy; Headache, migraine; History of prediabetes; Vitamin D deficiency; Medication management; Age-related osteoporosis without current pathological fracture; Hypothyroidism; Insomnia; Right bundle branch block (RBBB) on electrocardiogram (ECG); and Adult BMI <19 kg/sq m on their problem list.   She is married, no children, has 2 cats, 2 dogs, retired.   She follows with GYN every 2-3 years for PAP smears, Dr. Alan Ripper office, just saw a few months ago.   She has had migraines since she was 64 y/o; she is on amlodipine 5 mg and aimovig injection for migraines. She uses zolmitriptan as abortive which works well for her.  Recently improved.  She was following with Dr. Sima Matas but she moved, now prescribed meds from our office; she reports 1 migraine in the last 3 months.   She was able to transition off of valium for sleep with trazodone, taking 50 mg, does wake up a bit too early.   She is followed by [facility] for chronic back pain, getting injections which have been helpful. Planning a nerve ablation in Jan 2022.   she has a diagnosis of GERD  which is currently managed by omeprazole 20 mg PRN, estimates 2 days/week. she reports symptoms is currently well controlled, and denies breakthrough reflux, burning in chest, hoarseness or cough.    She follows with Dr. Cruzita Lederer for prolia injections, last DEXA 2019 lumbar T -3.2. Overdue for bone density, order in system, patient will schedule.   BMI is Body mass index is 17.6 kg/m., she has been working on diet and exercise, and walking more with her though limited by lower back pain. Admits was not eating as much for a period Wt Readings from Last 3 Encounters:  07/21/20 92 lb 6.4 oz (41.9 kg)  05/31/20 96 lb (43.5 kg)  04/28/20 91 lb 6.4 oz (41.5 kg)   Her blood pressure has been controlled at home, today their BP is BP: 132/84 She does workout in the form of walking. She denies chest pain, shortness of breath, dizziness.   She is on cholesterol medication (rosuvastatin 20 mg daily)  and denies myalgias. Her cholesterol is not at goal. The cholesterol last visit was:   Lab Results  Component Value Date   CHOL 182 01/21/2020   HDL 78 01/21/2020   LDLCALC 87 01/21/2020   TRIG 80 01/21/2020   CHOLHDL 2.3 01/21/2020   She has been working on diet and exercise for hx of prediabetes, she is not on bASA, she is not on ACE/ARB and denies nausea, polydipsia and polyuria. Last A1C in the office was:  Lab Results  Component Value Date   HGBA1C 5.5 07/21/2019   She is on thyroid medication. Her medication was not changed last visit.  Taking 25 mcg daily.  Lab Results  Component Value Date   TSH 1.34 04/28/2020   Last GFR: Lab Results  Component Value Date   GFRNONAA 69 04/28/2020   Patient is on Vitamin D supplement, now taking 1000 mcg, was recommende dreduced dose by Dr. Cruzita Lederer  Lab Results  Component Value Date   VD25OH 41 01/21/2020        Current Medications:  Current Outpatient Medications on File Prior to Visit  Medication Sig Dispense Refill   amLODipine (NORVASC) 5  MG tablet Take 1 tablet     Daily     to Prevent Migraines 90 tablet 0   Cholecalciferol 25 MCG (1000 UT) capsule Take 1 capsule (1,000 Units total) by mouth daily.     denosumab (PROLIA) 60 MG/ML SOSY injection Inject 60 mg into the skin every 6 (six) months.     Erenumab-aooe (AIMOVIG Shubert) Inject into the skin every 30 (thirty) days.     esomeprazole (NEXIUM) 40 MG capsule Take     1 capsule     Daily        for Stomach Acid (Patient taking differently: Take     1 capsule     Daily        for Stomach Acid) 30 capsule 1   famotidine (PEPCID) 20 MG tablet Take 1 tab at bedtime daily for reflux. 60 tablet 1  levothyroxine (SYNTHROID) 25 MCG tablet Take 1 tablet daily on an empty stomach with only water for 30 minutes & no Antacid meds, Calcium or Magnesium for 4 hours & avoid Biotin 90 tablet 3   rosuvastatin (CRESTOR) 20 MG tablet Take 1 tablet Daily for Cholesterol 90 tablet 1   zolmitriptan (ZOMIG-ZMT) 2.5 MG disintegrating tablet DIS ONE T PO UTD PRF MIGRAINE  5   No current facility-administered medications on file prior to visit.   Allergies:  Allergies  Allergen Reactions   Aleve [Naproxen Sodium] Rash        Lipitor [Atorvastatin] Other (See Comments)   Niacin And Related Anaphylaxis   Omnaris [Ciclesonide] Other (See Comments)    headache   Ceftin [Cefuroxime Axetil] Rash   Other Other (See Comments)   Levaquin [Levofloxacin In D5w]     Muscle cramps   Simvastatin    Zetia [Ezetimibe]    Medical History:  She has Hypertension; Hyperlipidemia; GERD (gastroesophageal reflux disease); Cervical spondylosis without myelopathy; Headache, migraine; History of prediabetes; Vitamin D deficiency; Medication management; Age-related osteoporosis without current pathological fracture; Hypothyroidism; Insomnia; Right bundle branch block (RBBB) on electrocardiogram (ECG); and Adult BMI <19 kg/sq m on their problem list. Health Maintenance:   Immunization History   Administered Date(s) Administered   Moderna Sars-Covid-2 Vaccination 09/12/2019, 10/10/2019, 05/22/2020   PPD Test 09/08/2013   Pneumococcal-Unspecified 08/01/2007   Tdap 07/31/2011   Zoster 11/21/2016    Tetanus: 2012, Due 2022 Pneumovax: 2009 Flu vaccine: Declined Zostavax: 2018 Covid 19: 3/3, 2021, moderna  LMP: Post-Menopausal Pap: 2017, due 2020, patient states will schedule follow up MGM: ? 04/02/2020 by GYN? Pending receipt of report DEXA: 2019 Colonoscopy: Due 2023  Last Dental Exam: 2020, encouraged to follow up Last Eye Exam:  2020, glasses   Patient Care Team: Unk Pinto, MD as PCP - General (Internal Medicine) Ladene Artist, MD as Consulting Physician (Gastroenterology) Fay Records, MD as Consulting Physician (Cardiology) Jessy Oto, MD as Consulting Physician (Orthopedic Surgery) Deneise Lever, MD as Consulting Physician (Pulmonary Disease) Amil Amen, MD as Referring Physician (Neurology) Dorene Ar, MD (Pain Medicine)  Surgical History:  She has a past surgical history that includes abdominal wall tear; Mouth surgery; fractured right wrist; Wrist surgery (Right, 1973); and LEEP. Family History:  Herfamily history includes Alzheimer's disease in her father; Depression in her mother; Hyperlipidemia in her father; Hypertension in her father; Migraines in her brother, mother, and sister; Stroke in her mother. Social History:  She reports that she quit smoking about 26 years ago. She started smoking about 46 years ago. She has a 19.00 pack-year smoking history. She has never used smokeless tobacco. She reports that she does not drink alcohol and does not use drugs.  Review of Systems: Review of Systems  Constitutional: Negative for chills, diaphoresis, fever, malaise/fatigue and weight loss.  HENT: Negative for congestion, ear discharge, ear pain, hearing loss, nosebleeds, sinus pain, sore throat and tinnitus.   Eyes: Negative for  blurred vision, double vision, photophobia, pain, discharge and redness.  Respiratory: Negative for cough (occasional, AM), hemoptysis, sputum production, shortness of breath, wheezing and stridor.   Cardiovascular: Negative for chest pain, palpitations, orthopnea, claudication, leg swelling and PND.  Gastrointestinal: Negative for abdominal pain, blood in stool, constipation, diarrhea, heartburn, melena, nausea and vomiting.  Genitourinary: Negative for dysuria, flank pain, frequency, hematuria and urgency.  Musculoskeletal: Positive for back pain. Negative for falls, joint pain, myalgias and neck pain.  Skin: Negative for itching and  rash.  Neurological: Positive for headaches. Negative for dizziness, tingling, tremors, sensory change, speech change, focal weakness, seizures, loss of consciousness and weakness.  Endo/Heme/Allergies: Negative for environmental allergies and polydipsia. Does not bruise/bleed easily.  Psychiatric/Behavioral: Negative for depression, hallucinations, memory loss, substance abuse and suicidal ideas. The patient is not nervous/anxious and does not have insomnia.     Physical Exam: Estimated body mass index is 17.6 kg/m as calculated from the following:   Height as of this encounter: 5' 0.75" (1.543 m).   Weight as of this encounter: 92 lb 6.4 oz (41.9 kg). BP 132/84    Pulse 65    Temp (!) 97.3 F (36.3 C)    Ht 5' 0.75" (1.543 m)    Wt 92 lb 6.4 oz (41.9 kg)    SpO2 98%    BMI 17.60 kg/m  General Appearance: Well nourished, in no apparent distress.  Eyes: PERRLA, EOMs, conjunctiva no swelling or erythema Sinuses: No Frontal/maxillary tenderness  ENT/Mouth: Ext aud canals clear, normal light reflex with TMs without erythema, bulging. Good dentition. No erythema, swelling, or exudate on post pharynx. Tonsils not swollen or erythematous. Hearing normal.   Neck: Supple, thyroid normal. No bruits  Respiratory: Respiratory effort normal, BS equal bilaterally without  rales, rhonchi, wheezing or stridor.  Cardio: RRR without murmurs, rubs or gallops. Brisk peripheral pulses without edema.  Chest: symmetric, with normal excursions and percussion.  Breasts: defer to GYN Abdomen: Soft, nontender, no guarding, rebound, hernias, masses, or organomegaly.  Lymphatics: Non tender without lymphadenopathy.  Genitourinary: defer to GYN Musculoskeletal: Full ROM all peripheral extremities,5/5 strength, and normal gait. Pain with palpation to cervical and lumbar spine. Skin: Warm, dry without rashes, lesions, ecchymosis. Neuro: Cranial nerves intact, reflexes equal bilaterally. Normal muscle tone, no cerebellar symptoms. Sensation intact.  Psych: Awake and oriented X 3, normal affect, Insight and Judgment appropriate.   EKG: Sinus rhythm, IRBBB     Izora Ribas, NP 4:23 PM [facility]

## 2020-07-21 NOTE — Patient Instructions (Addendum)
[name] , Thank you for taking time to come for your [facility] Visit. I appreciate your ongoing commitment to your health goals. Please review the following plan we discussed and let me know if I can assist you in the future.   These are the goals we discussed: Goals    . DIET - INCREASE WATER INTAKE     Increase fluids aiming for 55-65+ fluid ounces daily.     . Exercise 150 min/wk Moderate Activity     30 min x 5 days a week    . LDL CALC < 100       This is a list of the screening recommended for you and due dates:  Health Maintenance  Topic Date Due  .  Hepatitis C: One time screening is recommended by Center for Disease Control  (CDC) for  adults born from [birth year] through 1966.   Never done  . Mammogram  07/18/2013  . Pap Smear  12/20/2018  . Pneumonia vaccines (1 of 2 - PCV13) 09/06/2019  . Flu Shot  02/29/2020  . COVID-19 Vaccine (4 - Booster for Moderna series) 11/20/2020  . Tetanus Vaccine  07/30/2021  . Colon Cancer Screening  07/03/2022  . DEXA scan (bone density measurement)  Completed  . HIV Screening  Completed     HOW TO SCHEDULE A MAMMOGRAM  [facility]  7 a.m.-6:30 p.m., Monday 7 a.m.-5 p.m., Tuesday-Friday Schedule an appointment by calling [phone].     High-Protein and High-Calorie Diet Eating high-protein and high-calorie foods can help you to gain weight, heal after an injury, and recover after an illness or surgery. The specific amount of daily protein and calories you need depends on:  Your body weight.  The reason this diet is recommended for you. What is my plan? Generally, a high-protein, high-calorie diet involves:  Eating 250-500 extra calories each day.  Making sure that you get enough of your daily calories from protein. Ask your health care provider how many of your calories should come from protein. Talk with a health care provider, such as a diet and nutrition specialist  (dietitian), about how much protein and how many calories you need each day. Follow the diet as directed by your health care provider. What are tips for following this plan?  Preparing meals  Add whole milk, half-and-half, or heavy cream to cereal, pudding, soup, or hot cocoa.  Add whole milk to instant breakfast drinks.  Add peanut butter to oatmeal or smoothies.  Add powdered milk to baked goods, smoothies, or milkshakes.  Add powdered milk, cream, or butter to mashed potatoes.  Add cheese to cooked vegetables.  Make whole-milk yogurt parfaits. Top them with granola, fruit, or nuts.  Add cottage cheese to your fruit.  Add avocado, cheese, or both to sandwiches or salads.  Add meat, poultry, or seafood to rice, pasta, casseroles, salads, and soups.  Use mayonnaise when making egg salad, chicken salad, or tuna salad.  Use peanut butter as a dip for vegetables or as a topping for pretzels, celery, or crackers.  Add beans to casseroles, dips, and spreads.  Add pureed beans to sauces and soups.  Replace calorie-free drinks with calorie-containing drinks, such as milk and fruit juice.  Replace water with milk or heavy cream when making foods such as oatmeal, pudding, or cocoa. General instructions  Ask your health care provider if you should take a nutritional supplement.  Try to eat six small meals  each day instead of three large meals.  Eat a balanced diet. In each meal, include one food that is high in protein.  Keep nutritious snacks available, such as nuts, trail mixes, dried fruit, and yogurt.  If you have kidney disease or diabetes, talk with your health care provider about how much protein is safe for you. Too much protein may put extra stress on your kidneys.  Drink your calories. Choose high-calorie drinks and have them after your meals. What high-protein foods should I eat?  Vegetables Soybeans. Peas. Grains Quinoa. Bulgur wheat. Meats and other  proteins Beef, pork, and poultry. Fish and seafood. Eggs. Tofu. Textured vegetable protein (TVP). Peanut butter. Nuts and seeds. Dried beans. Protein powders. Dairy Whole milk. Whole-milk yogurt. Powdered milk. Cheese. [employer]. Eggnog. Beverages High-protein supplement drinks. Soy milk. Other foods Protein bars. The items listed above may not be a complete list of high-protein foods and beverages. Contact a dietitian for more options. What high-calorie foods should I eat? Fruits Dried fruit. Fruit leather. Canned fruit in syrup. Fruit juice. Avocado. Vegetables Vegetables cooked in oil or butter. Fried potatoes. Grains Pasta. Quick breads. Muffins. Pancakes. Ready-to-eat cereal. Meats and other proteins Peanut butter. Nuts and seeds. Dairy Heavy cream. Whipped cream. Cream cheese. Sour cream. Ice cream. Custard. Pudding. Beverages Meal-replacement beverages. Nutrition shakes. Fruit juice. Sugar-sweetened soft drinks. Seasonings and condiments Salad dressing. Mayonnaise. Alfredo sauce. Fruit preserves or jelly. Honey. Syrup. Sweets and desserts Cake. Cookies. Pie. Pastries. Candy bars. Chocolate. Fats and oils Butter or margarine. Oil. Gravy. Other foods Meal-replacement bars. The items listed above may not be a complete list of high-calorie foods and beverages. Contact a dietitian for more options. Summary  A high-protein, high-calorie diet can help you gain weight or heal faster after an injury, illness, or surgery.  To increase your protein and calories, add ingredients such as whole milk, peanut butter, cheese, beans, meat, or seafood to meal items.  To get enough extra calories each day, include high-calorie foods and beverages at each meal.  Adding a high-calorie drink or shake can be an easy way to help you get enough calories each day. Talk with your healthcare provider or dietitian about the best options for you. This information is not intended to replace  advice given to you by your health care provider. Make sure you discuss any questions you have with your health care provider. Document Revised: 06/29/2017 Document Reviewed: 05/29/2017 Elsevier Patient Education  2020 [employer].

## 2020-07-22 ENCOUNTER — Other Ambulatory Visit: Payer: Self-pay | Admitting: Adult Health

## 2020-07-22 ENCOUNTER — Encounter: Payer: Self-pay | Admitting: Adult Health

## 2020-07-22 DIAGNOSIS — N182 Chronic kidney disease, stage 2 (mild): Secondary | ICD-10-CM | POA: Insufficient documentation

## 2020-07-22 DIAGNOSIS — N289 Disorder of kidney and ureter, unspecified: Secondary | ICD-10-CM

## 2020-07-22 LAB — CBC WITH DIFFERENTIAL/PLATELET
Absolute Monocytes: 961 cells/uL — ABNORMAL HIGH (ref 200–950)
Basophils Absolute: 18 cells/uL (ref 0–200)
Basophils Relative: 0.2 %
Eosinophils Absolute: 107 cells/uL (ref 15–500)
Eosinophils Relative: 1.2 %
HCT: 43.1 % (ref 35.0–45.0)
Hemoglobin: 14.5 g/dL (ref 11.7–15.5)
Lymphs Abs: 2127 cells/uL (ref 850–3900)
MCH: 29.1 pg (ref 27.0–33.0)
MCHC: 33.6 g/dL (ref 32.0–36.0)
MCV: 86.5 fL (ref 80.0–100.0)
MPV: 10.6 fL (ref 7.5–12.5)
Monocytes Relative: 10.8 %
Neutro Abs: 5687 cells/uL (ref 1500–7800)
Neutrophils Relative %: 63.9 %
Platelets: 299 10*3/uL (ref 140–400)
RBC: 4.98 10*6/uL (ref 3.80–5.10)
RDW: 12.8 % (ref 11.0–15.0)
Total Lymphocyte: 23.9 %
WBC: 8.9 10*3/uL (ref 3.8–10.8)

## 2020-07-22 LAB — MAGNESIUM: Magnesium: 2.4 mg/dL (ref 1.5–2.5)

## 2020-07-22 LAB — COMPLETE METABOLIC PANEL WITH GFR
AG Ratio: 1.8 (calc) (ref 1.0–2.5)
ALT: 10 U/L (ref 6–29)
AST: 24 U/L (ref 10–35)
Albumin: 4.8 g/dL (ref 3.6–5.1)
Alkaline phosphatase (APISO): 26 U/L — ABNORMAL LOW (ref 37–153)
BUN/Creatinine Ratio: 18 (calc) (ref 6–22)
BUN: 19 mg/dL (ref 7–25)
CO2: 31 mmol/L (ref 20–32)
Calcium: 11 mg/dL — ABNORMAL HIGH (ref 8.6–10.4)
Chloride: 99 mmol/L (ref 98–110)
Creat: 1.08 mg/dL — ABNORMAL HIGH (ref 0.50–0.99)
GFR, Est African American: 62 mL/min/{1.73_m2} (ref >=60)
GFR, Est Non African American: 54 mL/min/{1.73_m2} — ABNORMAL LOW (ref >=60)
Globulin: 2.6 g/dL (calc) (ref 1.9–3.7)
Glucose, Bld: 84 mg/dL (ref 65–99)
Potassium: 4.1 mmol/L (ref 3.5–5.3)
Sodium: 140 mmol/L (ref 135–146)
Total Bilirubin: 0.5 mg/dL (ref 0.2–1.2)
Total Protein: 7.4 g/dL (ref 6.1–8.1)

## 2020-07-22 LAB — URINALYSIS, ROUTINE W REFLEX MICROSCOPIC
Bacteria, UA: NONE SEEN /HPF
Bilirubin Urine: NEGATIVE
Glucose, UA: NEGATIVE
Hgb urine dipstick: NEGATIVE
Hyaline Cast: NONE SEEN /LPF
Ketones, ur: NEGATIVE
Nitrite: NEGATIVE
Protein, ur: NEGATIVE
Specific Gravity, Urine: 1.013 (ref 1.001–1.03)
Squamous Epithelial / HPF: NONE SEEN /HPF (ref <=5)
pH: 7 (ref 5.0–8.0)

## 2020-07-22 LAB — LIPID PANEL
Cholesterol: 184 mg/dL (ref <200)
HDL: 72 mg/dL (ref >=50)
LDL Cholesterol (Calc): 90 mg/dL (calc)
Non-HDL Cholesterol (Calc): 112 mg/dL (calc) (ref <130)
Total CHOL/HDL Ratio: 2.6 (calc) (ref <5.0)
Triglycerides: 123 mg/dL (ref <150)

## 2020-07-22 LAB — MICROALBUMIN / CREATININE URINE RATIO
Creatinine, Urine: 81 mg/dL (ref 20–275)
Microalb Creat Ratio: 26 mcg/mg creat (ref <30)
Microalb, Ur: 2.1 mg/dL

## 2020-07-22 LAB — HEMOGLOBIN A1C
Hgb A1c MFr Bld: 5.7 % of total Hgb — ABNORMAL HIGH (ref <5.7)
Mean Plasma Glucose: 117 mg/dL
eAG (mmol/L): 6.5 mmol/L

## 2020-07-22 LAB — TSH: TSH: 1.53 mIU/L (ref 0.40–4.50)

## 2020-07-22 LAB — HEPATITIS C ANTIBODY
Hepatitis C Ab: NONREACTIVE
SIGNAL TO CUT-OFF: 0.01 (ref <1.00)

## 2020-07-22 LAB — VITAMIN D 25 HYDROXY (VIT D DEFICIENCY, FRACTURES): Vit D, 25-Hydroxy: 62 ng/mL (ref 30–100)

## 2020-09-02 ENCOUNTER — Other Ambulatory Visit: Payer: Self-pay

## 2020-09-02 ENCOUNTER — Ambulatory Visit (INDEPENDENT_AMBULATORY_CARE_PROVIDER_SITE_OTHER): Payer: Medicare HMO

## 2020-09-02 DIAGNOSIS — N289 Disorder of kidney and ureter, unspecified: Secondary | ICD-10-CM

## 2020-09-02 NOTE — Progress Notes (Signed)
Labs were entered in by ordering provider.  Patient had no concerns at this time.

## 2020-09-03 LAB — BASIC METABOLIC PANEL WITH GFR
BUN: 23 mg/dL (ref 7–25)
CO2: 31 mmol/L (ref 20–32)
Calcium: 10 mg/dL (ref 8.6–10.4)
Chloride: 99 mmol/L (ref 98–110)
Creat: 0.9 mg/dL (ref 0.50–0.99)
GFR, Est African American: 78 mL/min/{1.73_m2} (ref >=60)
GFR, Est Non African American: 67 mL/min/{1.73_m2} (ref >=60)
Glucose, Bld: 67 mg/dL (ref 65–99)
Potassium: 4 mmol/L (ref 3.5–5.3)
Sodium: 138 mmol/L (ref 135–146)

## 2020-09-10 ENCOUNTER — Other Ambulatory Visit: Payer: Self-pay | Admitting: Internal Medicine

## 2020-09-11 ENCOUNTER — Other Ambulatory Visit: Payer: Self-pay | Admitting: Internal Medicine

## 2020-09-11 DIAGNOSIS — E039 Hypothyroidism, unspecified: Secondary | ICD-10-CM

## 2020-09-11 MED ORDER — LEVOTHYROXINE SODIUM 25 MCG PO TABS
ORAL_TABLET | ORAL | 0 refills | Status: DC
Start: 2020-09-11 — End: 2020-09-13

## 2020-09-13 ENCOUNTER — Other Ambulatory Visit: Payer: Self-pay

## 2020-09-13 DIAGNOSIS — E039 Hypothyroidism, unspecified: Secondary | ICD-10-CM

## 2020-09-13 MED ORDER — LEVOTHYROXINE SODIUM 25 MCG PO TABS: ORAL_TABLET | ORAL | 0 refills | Status: DC

## 2020-09-13 NOTE — Progress Notes (Signed)
Levothyroxine sent to the wrong pharmacy.

## 2020-09-14 ENCOUNTER — Encounter: Payer: Self-pay | Admitting: Internal Medicine

## 2020-09-17 ENCOUNTER — Telehealth: Payer: Self-pay

## 2020-09-17 NOTE — Telephone Encounter (Signed)
Prolia VOB initiated via [employer] Portal.

## 2020-09-21 NOTE — Telephone Encounter (Signed)
Benefit Verification In Progress

## 2020-09-29 ENCOUNTER — Other Ambulatory Visit: Payer: Self-pay | Admitting: Adult Health

## 2020-09-29 DIAGNOSIS — G43709 Chronic migraine without aura, not intractable, without status migrainosus: Secondary | ICD-10-CM

## 2020-09-29 DIAGNOSIS — I10 Essential (primary) hypertension: Secondary | ICD-10-CM

## 2020-09-29 NOTE — Telephone Encounter (Signed)
PA REQUIRED  Prolia:20% coinsurance Admin fee: 20%  PRIMARY MEDICAL BENEFIT DETAILS (PHYSICIAN PURCHASE, OR REFERRAL TO TREATING SITE) COVERAGE AVAILABLE: Yes  COVERAGE DETAILS: For the primary MD purchase option, Prolia and administration will be subject to a 20% coinsurance up to a $5500 out of pocket max ($45 met). Deductible does not apply. Referral is not required. We have provided in network benefits only.  AUTHORIZATION REQUIRED: Yes  PA PROCESS DETAILS: PA is required and is currently not on file. Call [phone] complete the PA form available at [email or web address]

## 2020-10-01 NOTE — Telephone Encounter (Signed)
PA initiated via CoverMyMeds.  Key: [id number]

## 2020-10-26 ENCOUNTER — Other Ambulatory Visit: Payer: Self-pay | Admitting: Internal Medicine

## 2020-10-26 DIAGNOSIS — G3184 Mild cognitive impairment, so stated: Secondary | ICD-10-CM

## 2020-10-26 DIAGNOSIS — G43709 Chronic migraine without aura, not intractable, without status migrainosus: Secondary | ICD-10-CM

## 2020-10-27 ENCOUNTER — Other Ambulatory Visit: Payer: Self-pay

## 2020-10-27 ENCOUNTER — Encounter: Payer: Self-pay | Admitting: Adult Health

## 2020-10-27 ENCOUNTER — Ambulatory Visit (INDEPENDENT_AMBULATORY_CARE_PROVIDER_SITE_OTHER): Payer: Medicare HMO | Admitting: Adult Health

## 2020-10-27 VITALS — BP 124/76 | HR 82 | Temp 96.8°F | Wt 93.0 lb

## 2020-10-27 DIAGNOSIS — R413 Other amnesia: Secondary | ICD-10-CM

## 2020-10-27 DIAGNOSIS — Z681 Body mass index (BMI) 19 or less, adult: Secondary | ICD-10-CM | POA: Diagnosis not present

## 2020-10-27 DIAGNOSIS — E559 Vitamin D deficiency, unspecified: Secondary | ICD-10-CM | POA: Diagnosis not present

## 2020-10-27 DIAGNOSIS — Z79899 Other long term (current) drug therapy: Secondary | ICD-10-CM | POA: Diagnosis not present

## 2020-10-27 DIAGNOSIS — R7303 Prediabetes: Secondary | ICD-10-CM | POA: Diagnosis not present

## 2020-10-27 DIAGNOSIS — G43809 Other migraine, not intractable, without status migrainosus: Secondary | ICD-10-CM

## 2020-10-27 DIAGNOSIS — I10 Essential (primary) hypertension: Secondary | ICD-10-CM

## 2020-10-27 DIAGNOSIS — E782 Mixed hyperlipidemia: Secondary | ICD-10-CM | POA: Diagnosis not present

## 2020-10-27 DIAGNOSIS — E538 Deficiency of other specified B group vitamins: Secondary | ICD-10-CM | POA: Diagnosis not present

## 2020-10-27 DIAGNOSIS — G4700 Insomnia, unspecified: Secondary | ICD-10-CM

## 2020-10-27 DIAGNOSIS — E039 Hypothyroidism, unspecified: Secondary | ICD-10-CM | POA: Diagnosis not present

## 2020-10-27 MED ORDER — AIMOVIG 140 MG/ML ~~LOC~~ SOAJ
140.0000 mg | SUBCUTANEOUS | 9 refills | Status: DC
Start: 2020-10-27 — End: 2021-08-22

## 2020-10-27 NOTE — Progress Notes (Signed)
FOLLOW UP  Assessment and Plan:   Migraines significantly improved with aimovig Abortive works well if needed Monitor  Hypertension Well controlled with current medications  Monitor blood pressure at home; patient to call if consistently greater than 130/80 Continue DASH diet.   Reminder to go to the ER if any CP, SOB, nausea, dizziness, severe HA, changes vision/speech, left arm numbness and tingling and jaw pain.  Cholesterol Currently at goal; continue statin LDL goal <100;  Continue low cholesterol diet and exercise.  Check lipid panel.   BMi <19 Continue to recommend diet heavy in fruits and veggies and low in animal meats, cheeses, and dairy products, appropriate calorie intake Discuss exercise recommendations routinely Continue to monitor weight at each visit  Vitamin D Def Stopped supplement; restart previous - unsure dose, ? 1000 IU daily   continue to recommend supplementation for goal of 60-100 Defer vitamin D level to next vsiti  Insomnia Doing well off of valium with trazodone  GERD Currently well managed by lifestyle Discussed diet, avoiding triggers and other lifestyle changes  Memory changes Gradual over last 2-3 years; poor short term recall and attention, 25/30 on MMSE today. Strong family history of Alzheimer's. Pending evaluation by neuro. Recent TSH and B12 supplemented.  Continue diet and meds as discussed. Further disposition pending results of labs. Discussed med's effects and SE's.   Over 30 minutes of exam, counseling, chart review, and critical decision making was performed.   Future Appointments  Date Time Provider [place]  11/25/2020  2:00 PM Philemon Kingdom, MD [facility] None  02/02/2021  4:00 PM Unk Pinto, MD [facility] None  07/21/2021  3:00 PM Liane Comber, NP [facility] None    ----------------------------------------------------------------------------------------------------------------------  HPI 65 y.o.  female  presents for 3 month follow up on hypertension, cholesterol, glucose, weight and vitamin D deficiency.   She has had migraines since she was 65 y/o; she is on amlodipine 5 mg and aimovig injection for migraines. She uses zolmitriptan as abortive which works well for her but needs rarely since on aimovig. Dr. Sima Matas moved, now prescribed out of our office. Recenty reported memory changes, persistent poor short term recall over the pandemic and recently referred to est with new neurologist.   She is off of valium, doing well with 75 mg daily for sleep.   She is seeing a new provider at [facility] for chronic back pain, getting injections which have been helpful, but doesn't last very long.   she has a diagnosis of GERD which is currently managed by lifestyle, rare breakthrough, takes tums occasionally.   BMI is Body mass index is 17.72 kg/m., she has been working on diet and exercise, walks daily with 2 border collies, walks in woods behind her house.  She pushes fruits/vegetables, avoids red meat.  She admits to poor water intake, working to increase Typically eats light breakfast and lunch, large dinner, eats when she's hungry  Reports long history of weight in 80s-90s, this is baseline for her Wt Readings from Last 3 Encounters:  10/27/20 93 lb (42.2 kg)  09/02/20 94 lb (42.6 kg)  07/21/20 92 lb 6.4 oz (41.9 kg)   Today their BP is BP: 124/76  She does workout. She denies chest pain, shortness of breath, dizziness.   She is on cholesterol medication Rosuvastatin 10 mg daily and denies myalgias. Her LDL cholesterol is at goal. The cholesterol last visit was:   Lab Results  Component Value Date   CHOL 184 07/21/2020   HDL 72  07/21/2020   LDLCALC 90 07/21/2020   TRIG 123 07/21/2020   CHOLHDL 2.6 07/21/2020    She has been working on diet and exercise for glucose management, and denies increased appetite, nausea, paresthesia of the feet, polydipsia, polyuria, visual disturbances and  vomiting. Last A1C in the office was:  Lab Results  Component Value Date   HGBA1C 5.7 (H) 07/21/2020   She is on thyroid medication. Her medication was not changed last visit.  25 mcg daily.  Lab Results  Component Value Date   TSH 1.53 07/21/2020   Patient was on Vitamin D supplement but reports ran out, unsure how much she was taking  She is getting prolia injections at Dr. Arman Filter office for osteoporosis  Lab Results  Component Value Date   VD25OH 62 07/21/2020       Current Medications:  Current Outpatient Medications on File Prior to Visit  Medication Sig  . amLODipine (NORVASC) 5 MG tablet TAKE ONE TABLET BY MOUTH ONE TIME DAILY  . Cholecalciferol 25 MCG (1000 UT) capsule Take 1 capsule (1,000 Units total) by mouth daily.  Marland Kitchen denosumab (PROLIA) 60 MG/ML SOSY injection Inject 60 mg into the skin every 6 (six) months.  . famotidine (PEPCID) 20 MG tablet Take 1 tab at bedtime daily for reflux.  Marland Kitchen levothyroxine (SYNTHROID) 25 MCG tablet Take 1 tablet  Daily  on an empty stomach with only water for 30 minutes & no Antacid meds, Calcium or Magnesium for 4 hours & avoid Biotin  . rosuvastatin (CRESTOR) 20 MG tablet Take 1 tablet Daily for Cholesterol  . traZODone (DESYREL) 50 MG tablet TAKE 1 TABLET(50 MG) BY MOUTH 1 HOUR BEFORE BEDTIME AS NEEDED FOR SLEEP  . zolmitriptan (ZOMIG-ZMT) 2.5 MG disintegrating tablet DIS ONE T PO UTD PRF MIGRAINE  . esomeprazole (NEXIUM) 40 MG capsule Take     1 capsule     Daily        for Stomach Acid (Patient not taking: Reported on 10/27/2020)   No current facility-administered medications on file prior to visit.     Allergies:  Allergies  Allergen Reactions  . Aleve [Naproxen Sodium] Rash       . Lipitor [Atorvastatin] Other (See Comments)  . Niacin And Related Anaphylaxis  . Omnaris [Ciclesonide] Other (See Comments)    headache  . Ceftin [Cefuroxime Axetil] Rash  . Other Other (See Comments)  . Levaquin [Levofloxacin In D5w]      Muscle cramps  . Simvastatin   . Zetia [Ezetimibe]      Medical History:  Past Medical History:  Diagnosis Date  . Carpal tunnel syndrome on right   . History of concussion    1972, 1977, 1983  . Hyperlipidemia   . Hypertension   . Migraines    pain clinic [place]  . Osteoporosis    Family history- Reviewed and unchanged Social history- Reviewed and unchanged   Review of Systems:  Review of Systems  Constitutional: Negative for malaise/fatigue and weight loss.  HENT: Negative for hearing loss and tinnitus.   Eyes: Negative for blurred vision and double vision.  Respiratory: Negative for cough, shortness of breath and wheezing.   Cardiovascular: Negative for chest pain, palpitations, orthopnea, claudication and leg swelling.  Gastrointestinal: Negative for abdominal pain, blood in stool, constipation, diarrhea, heartburn, melena, nausea and vomiting.  Genitourinary: Negative.   Musculoskeletal: Positive for back pain (getting injections by specialist). Negative for joint pain and myalgias.  Skin: Negative for rash.  Neurological: Positive for  headaches (well controlled migraines). Negative for dizziness, tingling, sensory change and weakness.  Endo/Heme/Allergies: Negative for polydipsia.  Psychiatric/Behavioral: Positive for memory loss. Negative for depression, hallucinations and substance abuse. The patient is not nervous/anxious and does not have insomnia.   All other systems reviewed and are negative.    Physical Exam: BP 124/76   Pulse 82   Temp (!) 96.8 F (36 C)   Wt 93 lb (42.2 kg)   SpO2 99%   BMI 17.72 kg/m  Wt Readings from Last 3 Encounters:  10/27/20 93 lb (42.2 kg)  09/02/20 94 lb (42.6 kg)  07/21/20 92 lb 6.4 oz (41.9 kg)   General Appearance: Well nourished, in no apparent distress. Eyes: PERRLA, EOMs, conjunctiva no swelling or erythema Sinuses: No Frontal/maxillary tenderness ENT/Mouth: Ext aud canals clear, TMs without erythema, bulging. No  erythema, swelling, or exudate on post pharynx.  Tonsils not swollen or erythematous. Hearing normal.  Neck: Supple, thyroid normal.  Respiratory: Respiratory effort normal, BS equal bilaterally without rales, rhonchi, wheezing or stridor.  Cardio: RRR with no MRGs. Brisk peripheral pulses without edema.  Abdomen: Soft, + BS.  Non tender, no guarding, rebound, hernias, masses. Lymphatics: Non tender without lymphadenopathy.  Musculoskeletal: No obvious deformity, Full ROM, 5/5 strength, Normal gait Skin: Warm, dry without rashes, lesions, ecchymosis.  Neuro: Cranial nerves intact. No cerebellar symptoms. Poor short term recall, 1/3 word recall and attention.  Psych: Awake and oriented X 3, normal affect, Insight and Judgment appropriate.    Izora Ribas, NP 5:02 PM [facility]

## 2020-10-27 NOTE — Patient Instructions (Addendum)
  Goals    . DIET - INCREASE WATER INTAKE     Increase fluids working up to 55-65+ fluid ounces daily.     . Exercise 150 min/wk Moderate Activity     15-20 min walking daily     . LDL CALC < 100

## 2020-10-28 ENCOUNTER — Other Ambulatory Visit: Payer: Self-pay

## 2020-10-28 DIAGNOSIS — E782 Mixed hyperlipidemia: Secondary | ICD-10-CM

## 2020-10-28 LAB — COMPLETE METABOLIC PANEL WITH GFR
AG Ratio: 2.2 (calc) (ref 1.0–2.5)
ALT: 11 U/L (ref 6–29)
AST: 23 U/L (ref 10–35)
Albumin: 4.9 g/dL (ref 3.6–5.1)
Alkaline phosphatase (APISO): 26 U/L — ABNORMAL LOW (ref 37–153)
BUN: 18 mg/dL (ref 7–25)
CO2: 32 mmol/L (ref 20–32)
Calcium: 10.6 mg/dL — ABNORMAL HIGH (ref 8.6–10.4)
Chloride: 98 mmol/L (ref 98–110)
Creat: 0.97 mg/dL (ref 0.50–0.99)
GFR, Est African American: 71 mL/min/{1.73_m2} (ref >=60)
GFR, Est Non African American: 61 mL/min/{1.73_m2} (ref >=60)
Globulin: 2.2 g/dL (calc) (ref 1.9–3.7)
Glucose, Bld: 96 mg/dL (ref 65–99)
Potassium: 4 mmol/L (ref 3.5–5.3)
Sodium: 139 mmol/L (ref 135–146)
Total Bilirubin: 0.6 mg/dL (ref 0.2–1.2)
Total Protein: 7.1 g/dL (ref 6.1–8.1)

## 2020-10-28 LAB — LIPID PANEL
Cholesterol: 176 mg/dL (ref <200)
HDL: 70 mg/dL (ref >=50)
LDL Cholesterol (Calc): 90 mg/dL (calc)
Non-HDL Cholesterol (Calc): 106 mg/dL (calc) (ref <130)
Total CHOL/HDL Ratio: 2.5 (calc) (ref <5.0)
Triglycerides: 70 mg/dL (ref <150)

## 2020-10-28 LAB — CBC WITH DIFFERENTIAL/PLATELET
Absolute Monocytes: 703 cells/uL (ref 200–950)
Basophils Absolute: 38 cells/uL (ref 0–200)
Basophils Relative: 0.4 %
Eosinophils Absolute: 67 cells/uL (ref 15–500)
Eosinophils Relative: 0.7 %
HCT: 43.1 % (ref 35.0–45.0)
Hemoglobin: 14.2 g/dL (ref 11.7–15.5)
Lymphs Abs: 2755 cells/uL (ref 850–3900)
MCH: 29.5 pg (ref 27.0–33.0)
MCHC: 32.9 g/dL (ref 32.0–36.0)
MCV: 89.4 fL (ref 80.0–100.0)
MPV: 10.5 fL (ref 7.5–12.5)
Monocytes Relative: 7.4 %
Neutro Abs: 5938 cells/uL (ref 1500–7800)
Neutrophils Relative %: 62.5 %
Platelets: 298 10*3/uL (ref 140–400)
RBC: 4.82 10*6/uL (ref 3.80–5.10)
RDW: 13.1 % (ref 11.0–15.0)
Total Lymphocyte: 29 %
WBC: 9.5 10*3/uL (ref 3.8–10.8)

## 2020-10-28 LAB — VITAMIN B12: Vitamin B-12: 633 pg/mL (ref 200–1100)

## 2020-10-28 LAB — TSH: TSH: 1.41 mIU/L (ref 0.40–4.50)

## 2020-10-28 LAB — MAGNESIUM: Magnesium: 2.2 mg/dL (ref 1.5–2.5)

## 2020-10-28 MED ORDER — ROSUVASTATIN CALCIUM 20 MG PO TABS: ORAL_TABLET | ORAL | 1 refills | Status: DC

## 2020-10-29 ENCOUNTER — Other Ambulatory Visit: Payer: Self-pay | Admitting: Internal Medicine

## 2020-10-29 DIAGNOSIS — E782 Mixed hyperlipidemia: Secondary | ICD-10-CM

## 2020-11-05 NOTE — Telephone Encounter (Addendum)
Prior Auth re-submitted  Tava Work Portia (Key: [id number] help? Call us at [phone] Status Sent to [employer] The plan will fax you a determination, typically within 1 to 5 business days.

## 2020-11-05 NOTE — Telephone Encounter (Signed)
Hi Isabel [name],  Please let me know the outcome once prior authorization determination fax has been received.   Thank you!

## 2020-11-08 NOTE — Telephone Encounter (Signed)
Fax received from [facility] referral number: [id number]  [place] request was already approved valid from 10/01/2020 through 10/01/2021 Any questions call [phone]

## 2020-11-09 NOTE — Telephone Encounter (Signed)
Pt ready for scheduling (or can receive Prolia inj at upcoming visit with Dr. Cruzita Lederer)  Out-of-pocket cost due at time of visit: $280  Prolia co-insurance: 20% ($255) Admin fee co-insurance: 20% ($25) Deductible does not apply

## 2020-11-24 ENCOUNTER — Encounter: Payer: Self-pay | Admitting: Internal Medicine

## 2020-11-25 ENCOUNTER — Ambulatory Visit: Payer: Medicare HMO | Admitting: Internal Medicine

## 2020-11-25 ENCOUNTER — Encounter: Payer: Self-pay | Admitting: Internal Medicine

## 2020-11-25 ENCOUNTER — Other Ambulatory Visit: Payer: Self-pay

## 2020-11-25 VITALS — BP 118/78 | HR 74 | Ht 60.75 in | Wt 90.2 lb

## 2020-11-25 DIAGNOSIS — M810 Age-related osteoporosis without current pathological fracture: Secondary | ICD-10-CM

## 2020-11-25 DIAGNOSIS — E559 Vitamin D deficiency, unspecified: Secondary | ICD-10-CM | POA: Diagnosis not present

## 2020-11-25 DIAGNOSIS — R748 Abnormal levels of other serum enzymes: Secondary | ICD-10-CM

## 2020-11-25 MED ORDER — DENOSUMAB 60 MG/ML ~~LOC~~ SOSY
60.0000 mg | PREFILLED_SYRINGE | Freq: Once | SUBCUTANEOUS | Status: AC
  Administered 2020-11-25: 60 mg via SUBCUTANEOUS

## 2020-11-25 NOTE — Progress Notes (Signed)
Patient ID: Isabel [name] Work Isabel [name], female   DOB: 1956/04/26, 65 y.o.   MRN: [id number]   This visit occurred during the SARS-CoV-2 public health emergency.  Safety protocols were in place, including screening questions prior to the visit, additional usage of staff PPE, and extensive cleaning of exam room while observing appropriate contact time as indicated for disinfecting solutions.   HPI  Candis Kabel Work Isabel [name] is a 65 y.o.-year-old female, returning for follow-up for osteoporosis and history of vitamin D deficiency.  Last visit 1 year ago.  Interim history: No fall/fractures since last OV. No blurry vision/dizziness. She is not exercising but working in the garden. She did not schedule another bone density since last visit.  Pt was dx with OP in 2013.  Reviewed DXA scan reports: Date L1-L4 T score FN T score  02/04/2018 ([place]) -3.2 RFN: -2.0 LFN: -2.4  10/15/2015 [facility])  -3.0  RFN: -1.8 LFN: -2.1   10/07/2013 [facility])  -3.1 (-5.6%*)  RFN: -1.9 LFN: -2.2   07/10/2011 [facility])  -2.8  RFN: n/a LFN: n/a   + h/o one fracture as a child: - R arm - in 82 (55 y/o)  She fell from the kitchen counter in 2000.  No fractures as an adult.  Treatments reviewed: - Alendronate: for many years >> stopped because of possible ONJ.  Pain was migratory-questionable nerve pain. - Prolia -05/02/2018, 11/25/2018, 06/11/2019, 01/02/2020  After our last visit, she was reticent to continue with Prolia.  I referred her to Dr. Edmonia James at [facility] for a second opinion.  He suggested to continue with Prolia versus using Actonel.  Patient decided to continue with Prolia.  Reviewed the reports  of her 3-D reconstructed images of her mouth from 09/06/2016: "The region of interest demonstrates radiographic signs that may be suggestive of osteonecrosis/osteomyelitis. If the patient has a history of taking bisphosphonate drugs, the region of interest would correlate with  osteonecrosis. Otherwise, the region of interest may be osteomyelitis. Clinical correlation is recommended".  Her N-telopeptide was not suppressed in 2018 (ideally <10), but not higher than 18: Component     Latest Ref Rng & Units 11/23/2016  N-Telopeptide     6.2 - 19.0 nM BCE 15.4   Vitamin D level normal at last check but this was high in the past: Lab Results  Component Value Date   VD25OH 62 07/21/2020   VD25OH 41 01/21/2020   VD25OH >120 11/25/2019   VD25OH 86 07/21/2019   VD25OH 113.35 ([place]) 11/25/2018   VD25OH 73 06/04/2018   VD25OH 101.31 ([facility]) 03/21/2018   VD25OH 67 11/08/2016   VD25OH 56 05/03/2016   VD25OH 65 05/04/2015   She is on: - Calcium 500 mg daily - Vitamin D 6000 >> 8000 >> 6000 >> 3000 >> ?  units daily-she is not sure (MVI + supplement)  No weightbearing exercises. She was practicing yoga x 9 years >> then stopped b/c pain with movement.  She walks her dog daily in the woods behind her home.  No history of high vitamin A intake.  It is unclear when she started menopause as she was taking hormonal tx for severe HAs (from severe cervical OA). She was also on Methadone, tried Botox.  She has [facility] burns at [facility].  Her mother has a history of a dowager hump.  No history of kidney stones.  She had 2 slightly high calcium levels since last visit:  Lab Results  Component Value Date   CALCIUM 10.6 (  H) 10/27/2020   CALCIUM 10.0 09/02/2020   CALCIUM 11.0 (H) 07/21/2020   CALCIUM 9.6 04/28/2020   CALCIUM 9.9 01/21/2020   CALCIUM 10.1 10/20/2019   CALCIUM 9.8 07/21/2019   CALCIUM 9.9 04/03/2019   CALCIUM 9.6 01/13/2019   CALCIUM 10.2 10/02/2018   No history of hyperthyroidism.  She takes levothyroxine 25 mcg daily.  Reviewed previous TFTs:: Lab Results  Component Value Date   TSH 1.41 10/27/2020   TSH 1.53 07/21/2020   TSH 1.34 04/28/2020   TSH 0.81 01/21/2020   TSH 1.12 10/20/2019   No CKD.  Last BUN/creatinine: Lab Results  Component Value Date    BUN 18 10/27/2020   CREATININE 0.97 10/27/2020   She has a history of low alkaline phosphatase: Lab Results  Component Value Date   ALKPHOS 25 (L) 11/25/2019   ALKPHOS 22 (L) 11/25/2018   ALKPHOS 26 (L) 02/26/2017   ALKPHOS 23 (L) 11/08/2016   ALKPHOS 27 (L) 05/03/2016   ALKPHOS 19 (L) 11/03/2015   ALKPHOS 24 (L) 05/04/2015   ALKPHOS 22 (L) 12/17/2014   ALKPHOS 23 (L) 09/02/2014   ALKPHOS 27 (L) 04/23/2014   ALKPHOS 29 (L) 12/16/2013   ALKPHOS 40 09/08/2013   She has extensive FH of Alzheimer on her father's side.  ROS: Constitutional: no weight gain/no weight loss, no fatigue, no subjective hyperthermia, no subjective hypothermia Eyes: no blurry vision, no xerophthalmia ENT: no sore throat, no nodules palpated in neck, no dysphagia, no odynophagia, no hoarseness Cardiovascular: no CP/no SOB/no palpitations/no leg swelling Respiratory: no cough/no SOB/no wheezing Gastrointestinal: no N/no V/no D/no C/no acid reflux Musculoskeletal: no muscle aches/no joint aches Skin: no rashes, no hair loss Neurological: no tremors/no numbness/no tingling/no dizziness  I reviewed pt's medications, allergies, PMH, social hx, family hx, and changes were documented in the history of present illness. Otherwise, unchanged from my initial visit note.  Past Medical History:  Diagnosis Date  . Carpal tunnel syndrome on right   . History of concussion    1972, 1977, 1983  . Hyperlipidemia   . Hypertension   . Migraines    pain clinic [place]  . Osteoporosis    Past Surgical History:  Procedure Laterality Date  . 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 fractured right wrist    . LEEP     early 20's  . MOUTH SURGERY    . WRIST SURGERY Right 1973   Social History   Social History  . Marital status: Married    Spouse name: N/A  . Number of children: 0   Occupational History  . Home maker   Social History Main Topics  . Smoking status: Former Smoker     Quit date: 06/07/1994  . Smokeless tobacco: Never Used  . Alcohol use Yes     Comment: rare  . Drug use: No   Current Outpatient Medications on File Prior to Visit  Medication Sig Dispense Refill  . AIMOVIG 140 MG/ML SOAJ Inject 140 mg into the skin every 30 (thirty) days. 1.12 mL 9  . amLODipine (NORVASC) 5 MG tablet TAKE ONE TABLET BY MOUTH ONE TIME DAILY 90 tablet 3  . Cholecalciferol 25 MCG (1000 UT) capsule Take 1 capsule (1,000 Units total) by mouth daily.    Marland Kitchen denosumab (PROLIA) 60 MG/ML SOSY injection Inject 60 mg into the skin every 6 (six) months.    . esomeprazole (NEXIUM) 40 MG capsule Take     1 capsule  Daily        for Stomach Acid (Patient not taking: Reported on 10/27/2020) 30 capsule 1  . famotidine (PEPCID) 20 MG tablet Take 1 tab at bedtime daily for reflux. 60 tablet 1  . levothyroxine (SYNTHROID) 25 MCG tablet Take 1 tablet  Daily  on an empty stomach with only water for 30 minutes & no Antacid meds, Calcium or Magnesium for 4 hours & avoid Biotin 90 tablet 0  . rosuvastatin (CRESTOR) 20 MG tablet TAKE ONE TABLET BY MOUTH ONE TIME DAILY FOR CHOLESTEROL 90 tablet 1  . traZODone (DESYREL) 50 MG tablet TAKE 1 TABLET(50 MG) BY MOUTH 1 HOUR BEFORE BEDTIME AS NEEDED FOR SLEEP 90 tablet 1  . zolmitriptan (ZOMIG-ZMT) 2.5 MG disintegrating tablet DIS ONE T PO UTD PRF MIGRAINE  5   No current facility-administered medications on file prior to visit.   Allergies  Allergen Reactions  . Aleve [Naproxen Sodium] Rash       . Lipitor [Atorvastatin] Other (See Comments)  . Niacin And Related Anaphylaxis  . Omnaris [Ciclesonide] Other (See Comments)    headache  . Ceftin [Cefuroxime Axetil] Rash  . Other Other (See Comments)  . Levaquin [Levofloxacin In D5w]     Muscle cramps  . Simvastatin   . Zetia [Ezetimibe]    Family History  Problem Relation Age of Onset  . Depression Mother   . Migraines Mother   . Stroke Mother   . Alzheimer's disease Father   . Hyperlipidemia  Father   . Hypertension Father   . Migraines Sister   . Migraines Brother   . Colon cancer Neg Hx    PE: BP 118/78 (BP Location: Right Arm, Patient Position: Sitting, Cuff Size: Normal)   Pulse 74   Ht 5' 0.75" (1.543 m)   Wt 90 lb 3.2 oz (40.9 kg)   SpO2 99%   BMI 17.18 kg/m  Wt Readings from Last 3 Encounters:  11/25/20 90 lb 3.2 oz (40.9 kg)  10/27/20 93 lb (42.2 kg)  09/02/20 94 lb (42.6 kg)   Constitutional: Thin, in NAD Eyes: PERRLA, EOMI, no exophthalmos ENT: moist mucous membranes, no thyromegaly, no cervical lymphadenopathy Cardiovascular: RRR, No MRG Respiratory: CTA B Gastrointestinal: abdomen soft, NT, ND, BS+ Musculoskeletal: no deformities, strength intact in all 4 Skin: moist, warm, no rashes Neurological: no tremor with outstretched hands, DTR normal in all 4  Assessment: 1. Osteoporosis - Of note, she finished 1 year of osteo-strong skeletal loading.  Discussed with her $89 per month and was not covered by insurance  2. Vit D deficiency  3. Low alkaline phosphatase  4.  Hypercalcemia  Other tx'ing Dr's: Dr. Amil Amen Dr. Lazaro Arms  Plan: 1. Osteoporosis -Likely age-related/postmenopausal +/- family history of osteoporosis -On the latest bone mineral density report, her T-scores were slightly lower than before, however, the last report was generated on another densitometer, so the scores are not directly comparable.  They were low enough in the osteoporosis range to recommend treatment, though.  At last visit I advised her to schedule another bone density but she did not do this yet.  I reordered this today. -She was initially very reticent to start osteoporosis treatment, as she has a high risk for ONJ (she was diagnosed with this in the past after being on Fosamax).  After discussion about different options for treatment including benefits and possible side effects, she initially opted for Forteo, but after it became off label, and not likely  to be  covered by her insurance, she decided to proceed with Prolia.  First infusion was on 05/02/2018.  She had 3 more doses, 11/25/2018, 06/11/2019, 01/02/2020. -Before last visit she contacted me about doubts continue with Prolia and at that time I suggested a second opinion at [facility], with Dr. Edmonia James.  He saw the patient in consultation and recommended to continue with Prolia versus Actonel.  She decided to continue with Prolia. -She tolerates Prolia well w/o jaw/thigh/hip pain -We can continue Prolia for up to 10 years, if needed.  We will give her an injection today -I did advise her not to wait more than 7 months between injections so that she does not lose BMD and develop fractures -We will recheck a CMP and vitamin D level today  2. Vit D deficiency -We gradually decreased her vitamin D daily dose from 8000 to 6000 to 3000 units daily as her vit D was >120 in 10/2019 -Her latest vitamin D level was normal in 06/2020, at 62. -At this visit, she tells me that she thinks she is taking less vitamin D but she does not know exactly how much.  I advised her to look at home and let me know -We will repeat this today  3. Low Aphos -Latest level was reviewed and this was still low, at 26. -Possible causes are: hypophosphatasia, blood collected with EDTA or oxalate anticoagulant, hypothyroidism, vitamin C and B12 deficiency, Milk alkali syndrome, protein/calorie malnutrition, zinc and magnesium deficiency. -Her TSH was normal.  She is taking magnesium and B12.  No previous history of fractures or tooth problems to suggest hypophosphatasia.  She had a high vitamin D level, but her alkaline phosphatase remains low even after correcting this. -We will recheck the level today -We started a multivitamin daily  4.  Hypercalcemia -She has 2 instances of slightly low calcium recently -We will recheck her calcium, vitamin D and will add a PTH level today  Component     Latest Ref Rng & Units  11/25/2020          Glucose     65 - 99 mg/dL 75  BUN     7 - 25 mg/dL 15  Creatinine     0.50 - 0.99 mg/dL 0.88  GFR, Est Non African American     > OR = 60 mL/min/1.36m2 68  GFR, Est African American     > OR = 60 mL/min/1.63m2 79  BUN/Creatinine Ratio     6 - 22 (calc) NOT APPLICABLE  Sodium     [id number] - 146 mmol/L 140  Potassium     3.5 - 5.3 mmol/L 3.8  Chloride     98 - 110 mmol/L 100  CO2     20 - 32 mmol/L 30  Calcium     8.6 - 10.4 mg/dL 9.8  Total Protein     6.1 - 8.1 g/dL 6.8  Albumin MSPROF     3.6 - 5.1 g/dL 4.7  Globulin     1.9 - 3.7 g/dL (calc) 2.1  AG Ratio     1.0 - 2.5 (calc) 2.2  Total Bilirubin     0.2 - 1.2 mg/dL 0.5  Alkaline phosphatase (APISO)     37 - 153 U/L 23 (L)  AST     10 - 35 U/L 27  ALT     6 - 29 U/L 15  Vitamin D, 25-Hydroxy     30.0 - 100.0 ng/mL 59.6  PTH, Intact  15 - 65 pg/mL 31   Vitamin D, calcium, PTH, all normal. Kidney function normal. Alkaline phosphatase, still slightly low.  Philemon Kingdom, MD PhD [facility] Endocrinology

## 2020-11-25 NOTE — Patient Instructions (Addendum)
Please stop at the lab.  Please continue vitamin D + multivitamin.  Please call and schedule bone density scan at the [facility]: [phone].  Please come back for a follow-up appointment in 1 year.

## 2020-11-25 NOTE — Progress Notes (Signed)
Prolia injection administered to pt's right arm. Pt tolerated well.

## 2020-11-26 LAB — COMPLETE METABOLIC PANEL WITH GFR
AG Ratio: 2.2 (calc) (ref 1.0–2.5)
ALT: 15 U/L (ref 6–29)
AST: 27 U/L (ref 10–35)
Albumin: 4.7 g/dL (ref 3.6–5.1)
Alkaline phosphatase (APISO): 23 U/L — ABNORMAL LOW (ref 37–153)
BUN: 15 mg/dL (ref 7–25)
CO2: 30 mmol/L (ref 20–32)
Calcium: 9.8 mg/dL (ref 8.6–10.4)
Chloride: 100 mmol/L (ref 98–110)
Creat: 0.88 mg/dL (ref 0.50–0.99)
GFR, Est African American: 79 mL/min/{1.73_m2} (ref >=60)
GFR, Est Non African American: 68 mL/min/{1.73_m2} (ref >=60)
Globulin: 2.1 g/dL (calc) (ref 1.9–3.7)
Glucose, Bld: 75 mg/dL (ref 65–99)
Potassium: 3.8 mmol/L (ref 3.5–5.3)
Sodium: 140 mmol/L (ref 135–146)
Total Bilirubin: 0.5 mg/dL (ref 0.2–1.2)
Total Protein: 6.8 g/dL (ref 6.1–8.1)

## 2020-11-26 LAB — VITAMIN D 25 HYDROXY (VIT D DEFICIENCY, FRACTURES): Vit D, 25-Hydroxy: 59.6 ng/mL (ref 30.0–100.0)

## 2020-11-26 LAB — PARATHYROID HORMONE, INTACT (NO CA): PTH: 31 pg/mL (ref 15–65)

## 2020-11-28 ENCOUNTER — Other Ambulatory Visit: Payer: Self-pay | Admitting: Internal Medicine

## 2020-11-28 DIAGNOSIS — E039 Hypothyroidism, unspecified: Secondary | ICD-10-CM

## 2021-01-19 ENCOUNTER — Other Ambulatory Visit: Payer: Self-pay | Admitting: Internal Medicine

## 2021-01-19 DIAGNOSIS — F5101 Primary insomnia: Secondary | ICD-10-CM

## 2021-01-19 MED ORDER — MIRTAZAPINE 30 MG PO TABS: ORAL_TABLET | ORAL | 0 refills | Status: DC

## 2021-01-27 ENCOUNTER — Other Ambulatory Visit: Payer: Self-pay | Admitting: Adult Health

## 2021-02-02 ENCOUNTER — Ambulatory Visit (INDEPENDENT_AMBULATORY_CARE_PROVIDER_SITE_OTHER): Payer: Medicare HMO | Admitting: Internal Medicine

## 2021-02-02 ENCOUNTER — Other Ambulatory Visit: Payer: Self-pay

## 2021-02-02 ENCOUNTER — Encounter: Payer: Self-pay | Admitting: Internal Medicine

## 2021-02-02 VITALS — BP 140/80 | HR 86 | Temp 97.7°F | Resp 17 | Ht 61.0 in | Wt 90.2 lb

## 2021-02-02 DIAGNOSIS — R7309 Other abnormal glucose: Secondary | ICD-10-CM | POA: Diagnosis not present

## 2021-02-02 DIAGNOSIS — E559 Vitamin D deficiency, unspecified: Secondary | ICD-10-CM

## 2021-02-02 DIAGNOSIS — R0989 Other specified symptoms and signs involving the circulatory and respiratory systems: Secondary | ICD-10-CM | POA: Diagnosis not present

## 2021-02-02 DIAGNOSIS — G43709 Chronic migraine without aura, not intractable, without status migrainosus: Secondary | ICD-10-CM

## 2021-02-02 DIAGNOSIS — E039 Hypothyroidism, unspecified: Secondary | ICD-10-CM | POA: Diagnosis not present

## 2021-02-02 DIAGNOSIS — Z79899 Other long term (current) drug therapy: Secondary | ICD-10-CM | POA: Diagnosis not present

## 2021-02-02 DIAGNOSIS — E782 Mixed hyperlipidemia: Secondary | ICD-10-CM

## 2021-02-02 DIAGNOSIS — G3184 Mild cognitive impairment, so stated: Secondary | ICD-10-CM | POA: Diagnosis not present

## 2021-02-02 NOTE — Patient Instructions (Signed)

## 2021-02-02 NOTE — Progress Notes (Signed)
Future Appointments  Date Time Provider [place]  02/02/2021  4:00 PM Unk Pinto, MD [facility] None  07/21/2021  3:00 PM Liane Comber, NP [facility] None  11/25/2021  2:00 PM Philemon Kingdom, MD [facility] None    History of Present Illness:       This very nice 65 y.o. [name] presents for 3 month follow up with hx/o labile HTN and chronic Vascular HA's,  HLD,  GERD, Hypothyroidism, Osteoporosis  and Vitamin D Deficiency.       Patient presents again with c/o poor short term memory recall and poor attention & focus. Scored 25/30 on MMSE in Mar 2022. Patient was referred for Neuro eval and subsequently declined to schedule appt, but now requests referral again.            Patient is followed expectantly for labile HTN & BP has been controlled at home. Today's BP is at goal  - 140/80. Patient has had no complaints of any cardiac type chest pain, palpitations, dyspnea / orthopnea / PND, dizziness, claudication, or dependent edema.       Hyperlipidemia is controlled with diet & meds. Patient denies myalgias or other med SE's. Last Lipids were  Lab Results  Component Value Date   CHOL 176 10/27/2020   HDL 70 10/27/2020   LDLCALC 90 10/27/2020   TRIG 70 10/27/2020   CHOLHDL 2.5 10/27/2020        Patient was dx'd Hypothyroid in 2014 and since has been on thyroid replacement therapy.    Also, the patient has history of PreDiabetes and has had no symptoms of reactive hypoglycemia, diabetic polys, paresthesias or visual blurring.  Last A1c was borderline:  Lab Results  Component Value Date   HGBA1C 5.7 (H) 07/21/2020                                                        Further, the patient also has history of Vitamin D Deficiency and supplements vitamin D without any suspected side-effects. Last vitamin D was at goal:  Lab Results  Component Value Date   VD25OH 59.6 11/25/2020     Current Outpatient Medications on File Prior to Visit  Medication  Sig   mirtazapine  30 MG tablet Take  1/2 to 1 tablet  1 hour before Bedtime  for Appetite & Sleep   AIMOVIG 140 MG/ML  Inject 140 mg every 30 (thirty) days.   amLODipine 5 MG tablet TAKE ONE TABLET DAILY   Vitamin D 1,000 u  cap Take 1 capsule daily.   PROLIA) 60 MG/ML SOSY injec Inject 60 mg  every 6  months.   levothyroxine  25 MCG tablet Take  1 tablet  Daily     rosuvastatin 20 MG tablet TAKE ONE TABLET  DAILY    zolmitriptan (ZOMIG-ZMT) 2.5 MG disintegrating tablet DIS ONE T PO UTD PRF MIGRAINE    Allergies  Allergen Reactions   Aleve [Naproxen Sodium] Rash        Lipitor [Atorvastatin] Other (See Comments)   Niacin And Related Anaphylaxis   Omnaris [Ciclesonide] Other (See Comments)    headache   Ceftin [Cefuroxime Axetil] Rash   Other Other (See Comments)   Levaquin [Levofloxacin In D5w]     Muscle cramps   Simvastatin  Zetia [Ezetimibe]      PMHx:   Past Medical History:  Diagnosis Date   Carpal tunnel syndrome on right    History of concussion    1972, 1977, 1983   Hyperlipidemia    Hypertension    Migraines    pain clinic [facility]   Osteoporosis    Immunization History  Administered Date(s) Administered   Moderna Sars-Covid-2 Vaccination 09/12/2019, 10/10/2019, 05/22/2020   PPD Test 09/08/2013   Pneumococcal-23 08/01/2007   Tdap 07/31/2011   Zoster, Live 11/21/2016     Past Surgical History:  Procedure Laterality Date   abdominal wall tear     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 Eyes: Denies redness, blurred vision, diplopia, discharge, itchy, watery eyes.  ENT: Denies discharge, congestion, post nasal drip, epistaxis, sore throat, earache, hearing  loss, dental pain, tinnitus, vertigo, sinus pain, snoring.  CV: Denies chest pain, palpitations, irregular heartbeat, syncope, dyspnea, diaphoresis, orthopnea, PND, claudication or edema. Respiratory: denies cough, dyspnea, DOE, pleurisy, hoarseness, laryngitis, wheezing.  Gastrointestinal: Denies dysphagia, odynophagia, heartburn, reflux, water brash, abdominal pain or cramps, nausea, vomiting, bloating, diarrhea, constipation, hematemesis, melena, hematochezia  or hemorrhoids. Genitourinary: Denies dysuria, frequency, urgency, nocturia, hesitancy, discharge, hematuria or flank pain. Musculoskeletal: Denies arthralgias, myalgias, stiffness, jt. swelling, pain, limping or strain/sprain.  Skin: Denies pruritus, rash, hives, warts, acne, eczema or change in skin lesion(s). Neuro: No weakness, tremor, incoordination, spasms, paresthesia or pain. Psychiatric: Denies confusion, memory loss or sensory loss. Endo: Denies change in weight, skin or hair change.  Heme/Lymph: No excessive bleeding, bruising or enlarged lymph nodes.  Physical Exam  BP 140/80   Pulse 86   Temp 97.7 F (36.5 C)   Resp 17   Ht 5\' 1"  (1.549 m)   Wt 90 lb 3.2 oz (40.9 kg)   SpO2 98%   BMI 17.04 kg/m   Appears  well nourished, well groomed  and in no distress.  Eyes: PERRLA, EOMs, conjunctiva no swelling or erythema. Sinuses: No frontal/maxillary tenderness ENT/Mouth: EAC's clear, TM's nl w/o erythema, bulging. Nares clear w/o erythema, swelling, exudates. Oropharynx clear without erythema or exudates. Oral hygiene is good. Tongue normal, non obstructing. Hearing intact.  Neck: Supple. Thyroid not palpable. Car 2+/2+ without bruits, nodes or JVD. Chest: Respirations nl with BS clear & equal w/o rales, rhonchi, wheezing or stridor.  Cor: Heart sounds normal w/ regular rate and rhythm without sig. murmurs, gallops, clicks or rubs. Peripheral pulses normal and equal  without edema.  Abdomen: Soft & bowel sounds normal.  Non-tender w/o guarding, rebound, hernias, masses or organomegaly.  Lymphatics: Unremarkable.  Musculoskeletal: Full ROM all peripheral extremities, joint stability, 5/5 strength and normal gait.  Skin: Warm, dry without exposed rashes, lesions or ecchymosis apparent.  Neuro: Cranial nerves intact, reflexes equal bilaterally. Sensory-motor testing grossly intact. Tendon reflexes grossly intact.  Pysch: Alert & oriented x 3.  Insight and judgement nl & appropriate. Poor ST recall.   Assessment and Plan:  1. Labile hypertension  - Continue medication, monitor blood pressure at home.  - Continue DASH diet.  Reminder to go to the ER if any CP,  SOB, nausea, dizziness, severe HA, changes vision/speech.   - CBC with Differential/Platelet - COMPLETE METABOLIC PANEL WITH GFR - Magnesium - TSH  2. Hyperlipidemia, mixed  - Continue diet/meds, exercise,& lifestyle modifications.  - Continue monitor periodic cholesterol/liver & renal functions    - Lipid panel - TSH  3. Abnormal glucose  - Continue diet, exercise  - Lifestyle modifications.  - Monitor appropriate labs   - Hemoglobin A1c - Insulin, random  4. Vitamin D deficiency  - Continue supplementation   - VITAMIN D 25 Hydroxy   5. Hypothyroidism, unspecified type  - TSH  6. Chronic migraine   - Ambulatory referral to Neurology  7. Mild cognitive impairment with memory loss  - Ambulatory referral to Neurology  8. Medication management  - CBC with Differential/Platelet - COMPLETE METABOLIC PANEL WITH GFR - Magnesium - Lipid panel - TSH - Hemoglobin A1c - Insulin, random - VITAMIN D 25 Hydroxy         Discussed  regular exercise, BP monitoring, weight control to achieve/maintain BMI less than 25 and discussed med and SE's. Recommended labs to assess and monitor clinical status with further disposition pending results of labs.  I discussed the assessment and treatment plan with the patient. The patient was  provided an opportunity to ask questions and all were answered. The patient agreed with the plan and demonstrated an understanding of the instructions.  I provided over 30 minutes of exam, counseling, chart review and  complex critical decision making.        The patient was advised to call back or seek an in-person evaluation if the symptoms worsen or if the condition fails to improve as anticipated.   Kirtland Bouchard, MD

## 2021-02-03 LAB — COMPLETE METABOLIC PANEL WITH GFR
AG Ratio: 1.9 (calc) (ref 1.0–2.5)
ALT: 21 U/L (ref 6–29)
AST: 27 U/L (ref 10–35)
Albumin: 4.4 g/dL (ref 3.6–5.1)
Alkaline phosphatase (APISO): 27 U/L — ABNORMAL LOW (ref 37–153)
BUN: 23 mg/dL (ref 7–25)
CO2: 29 mmol/L (ref 20–32)
Calcium: 9.4 mg/dL (ref 8.6–10.4)
Chloride: 101 mmol/L (ref 98–110)
Creat: 0.9 mg/dL (ref 0.50–0.99)
GFR, Est African American: 77 mL/min/{1.73_m2} (ref >=60)
GFR, Est Non African American: 67 mL/min/{1.73_m2} (ref >=60)
Globulin: 2.3 g/dL (calc) (ref 1.9–3.7)
Glucose, Bld: 78 mg/dL (ref 65–99)
Potassium: 4.1 mmol/L (ref 3.5–5.3)
Sodium: 140 mmol/L (ref 135–146)
Total Bilirubin: 0.4 mg/dL (ref 0.2–1.2)
Total Protein: 6.7 g/dL (ref 6.1–8.1)

## 2021-02-03 LAB — LIPID PANEL
Cholesterol: 198 mg/dL (ref <200)
HDL: 75 mg/dL (ref >=50)
LDL Cholesterol (Calc): 99 mg/dL (calc)
Non-HDL Cholesterol (Calc): 123 mg/dL (calc) (ref <130)
Total CHOL/HDL Ratio: 2.6 (calc) (ref <5.0)
Triglycerides: 139 mg/dL (ref <150)

## 2021-02-03 LAB — CBC WITH DIFFERENTIAL/PLATELET
Absolute Monocytes: 872 cells/uL (ref 200–950)
Basophils Absolute: 20 cells/uL (ref 0–200)
Basophils Relative: 0.2 %
Eosinophils Absolute: 108 cells/uL (ref 15–500)
Eosinophils Relative: 1.1 %
HCT: 43.1 % (ref 35.0–45.0)
Hemoglobin: 13.9 g/dL (ref 11.7–15.5)
Lymphs Abs: 2068 cells/uL (ref 850–3900)
MCH: 29 pg (ref 27.0–33.0)
MCHC: 32.3 g/dL (ref 32.0–36.0)
MCV: 90 fL (ref 80.0–100.0)
MPV: 10.3 fL (ref 7.5–12.5)
Monocytes Relative: 8.9 %
Neutro Abs: 6733 cells/uL (ref 1500–7800)
Neutrophils Relative %: 68.7 %
Platelets: 305 10*3/uL (ref 140–400)
RBC: 4.79 10*6/uL (ref 3.80–5.10)
RDW: 12.7 % (ref 11.0–15.0)
Total Lymphocyte: 21.1 %
WBC: 9.8 10*3/uL (ref 3.8–10.8)

## 2021-02-03 LAB — INSULIN, RANDOM: Insulin: 8 u[IU]/mL

## 2021-02-03 LAB — TSH: TSH: 1.33 mIU/L (ref 0.40–4.50)

## 2021-02-03 LAB — HEMOGLOBIN A1C
Hgb A1c MFr Bld: 5.5 % of total Hgb (ref <5.7)
Mean Plasma Glucose: 111 mg/dL
eAG (mmol/L): 6.2 mmol/L

## 2021-02-03 LAB — VITAMIN D 25 HYDROXY (VIT D DEFICIENCY, FRACTURES): Vit D, 25-Hydroxy: 54 ng/mL (ref 30–100)

## 2021-02-03 LAB — MAGNESIUM: Magnesium: 2.5 mg/dL (ref 1.5–2.5)

## 2021-02-05 NOTE — Progress Notes (Signed)
============================================================ -   Test results slightly outside the reference range are not unusual. If there is anything important, I will review this with you,  otherwise it is considered normal test values.  If you have further questions,  please do not hesitate to contact me at the office or via My Chart.  ============================================================ ============================================================  -  Total Chol = 199 & LDL Chol = 99 - Both  Excellent   - Very low risk for Heart Attack  / Stroke ============================================================ ============================================================  -  A1c - back in Normal NonDiabetic Range - Great ! ============================================================ ============================================================  -  Vitamin D =  54 - Great ! ============================================================ ============================================================  -  All Else - CBC - Kidneys - Electrolytes - Liver - Magnesium & Thyroid    - all  Normal / OK ============================================================ ============================================================

## 2021-03-31 NOTE — Telephone Encounter (Signed)
Prolia VOB initiated via [email or web address]  Last OV: 11/25/20 Next OV: 11/25/21 Last Prolia inj: 11/25/20 Next Prolia inj DUE: 05/28/21

## 2021-04-05 NOTE — Telephone Encounter (Signed)
Pt ready for scheduling on or after 05/28/21  Out-of-pocket cost due at time of visit: $280  Primary: [employer] Prolia co-insurance: 20% (approximately $255) Admin fee co-insurance: 20% (approximately $25)  Secondary: n/a Prolia co-insurance:  Admin fee co-insurance:   Deductible: does not apply  Prior Auth: APPROVED PA# [id number]  Valid: 10/01/20-10/01/21  ** This summary of benefits is an estimation of the patient's out-of-pocket cost. Exact cost may very based on individual plan coverage.

## 2021-04-05 NOTE — Telephone Encounter (Signed)
Fax received from [facility] referral number: [id number]  [place] request was already approved valid from 10/01/2020 through 10/01/2021 Any questions call [phone]

## 2021-04-07 ENCOUNTER — Other Ambulatory Visit: Payer: Self-pay | Admitting: Internal Medicine

## 2021-04-07 MED ORDER — TRAZODONE HCL 150 MG PO TABS: ORAL_TABLET | ORAL | 1 refills | Status: DC

## 2021-05-04 ENCOUNTER — Telehealth: Payer: Self-pay | Admitting: Neurology

## 2021-05-04 ENCOUNTER — Ambulatory Visit: Payer: Medicare HMO | Admitting: Neurology

## 2021-05-04 ENCOUNTER — Encounter: Payer: Self-pay | Admitting: Neurology

## 2021-05-04 ENCOUNTER — Ambulatory Visit: Payer: BLUE CROSS/BLUE SHIELD | Admitting: Neurology

## 2021-05-04 VITALS — BP 143/80 | HR 84 | Ht 61.0 in | Wt 98.0 lb

## 2021-05-04 DIAGNOSIS — R4189 Other symptoms and signs involving cognitive functions and awareness: Secondary | ICD-10-CM | POA: Diagnosis not present
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
## 2021-05-04 NOTE — Telephone Encounter (Signed)
[employer] medicare order sent to [facility]. They will reach out to the patient to schedule and obtain the auth.

## 2021-05-15 ENCOUNTER — Ambulatory Visit
Admission: RE | Admit: 2021-05-15 | Discharge: 2021-05-15 | Disposition: A | Discharge status: Home / Self Care | Payer: Medicare HMO | Source: Ambulatory Visit | Attending: Neurology | Admitting: Neurology

## 2021-05-15 DIAGNOSIS — R4189 Other symptoms and signs involving cognitive functions and awareness: Secondary | ICD-10-CM | POA: Diagnosis not present

## 2021-05-16 ENCOUNTER — Telehealth: Payer: Self-pay | Admitting: Neurology

## 2021-05-16 NOTE — Telephone Encounter (Signed)
Left message for a return call

## 2021-05-16 NOTE — Telephone Encounter (Signed)
Patient had MRI at [facility] on Sunday 05/15/21. Since it is a Medicare plan, [facility] will go over the cost with them.

## 2021-05-16 NOTE — Telephone Encounter (Signed)
Spoke to pt and husband, results given, Husband would like to know if pt should start Namenda 10 mg twice a day and Aricept 10 mg a day.  Please advise

## 2021-05-16 NOTE — Telephone Encounter (Signed)
IMPRESSION: This MRI of the brain without contrast shows the following: 1.   Mild generalized cortical atrophy and minimal corpus callosum atrophy.  The extent is more than typical for age. 2.   Scattered T2/FLAIR hyperintense foci in the hemispheres consistent with mild chronic microvascular ischemic changes, a little more than typical for age. 3.   No acute findings.  Please call patient, MRI of the brain showed generalized atrophy, mild supratentorium small vessel disease, more than age-appropriate peer,  Correlate with her complaints of memory loss.

## 2021-05-17 MED ORDER — DONEPEZIL HCL 10 MG PO TABS: 10.0000 mg | ORAL_TABLET | Freq: Every day | ORAL | 11 refills | Status: DC

## 2021-05-17 MED ORDER — MEMANTINE HCL 10 MG PO TABS: 10.0000 mg | ORAL_TABLET | Freq: Two times a day (BID) | ORAL | 11 refills | Status: DC

## 2021-05-17 NOTE — Telephone Encounter (Signed)
Message from Dr Krista Blue given to pt.  Pt voiced understanding and asked for instructions to be sent through [facility]

## 2021-05-17 NOTE — Telephone Encounter (Signed)
Meds ordered this encounter  Medications   memantine (NAMENDA) 10 MG tablet    Sig: Take 1 tablet (10 mg total) by mouth 2 (two) times daily.    Dispense:  60 tablet    Refill:  11   donepezil (ARICEPT) 10 MG tablet    Sig: Take 1 tablet (10 mg total) by mouth at bedtime.    Dispense:  30 tablet    Refill:  11     Please let husband know, I have called in both prescription, but start with Namenda 10 mg twice a day for at least 2 weeks or longer, make sure her body agrees with the medication first, before add on Aricept 10 mg daily after meal, which has more GI side effect,

## 2021-05-17 NOTE — Addendum Note (Signed)
Addended by: Marcial Pacas on: 05/17/2021 10:31 AM   Modules accepted: Orders

## 2021-06-21 ENCOUNTER — Ambulatory Visit (INDEPENDENT_AMBULATORY_CARE_PROVIDER_SITE_OTHER): Payer: Medicare HMO

## 2021-06-21 ENCOUNTER — Other Ambulatory Visit: Payer: Self-pay

## 2021-06-21 DIAGNOSIS — E039 Hypothyroidism, unspecified: Secondary | ICD-10-CM

## 2021-06-21 MED ORDER — DENOSUMAB 60 MG/ML ~~LOC~~ SOSY
60.0000 mg | PREFILLED_SYRINGE | Freq: Once | SUBCUTANEOUS | Status: AC
  Administered 2021-06-21: 60 mg via SUBCUTANEOUS

## 2021-06-21 NOTE — Progress Notes (Signed)
Prolia injection administered to pt's left arm. Pt tolerated well. °

## 2021-06-26 ENCOUNTER — Encounter: Payer: Self-pay | Admitting: Neurology

## 2021-06-27 DIAGNOSIS — G894 Chronic pain syndrome: Secondary | ICD-10-CM | POA: Diagnosis not present

## 2021-06-27 DIAGNOSIS — M47896 Other spondylosis, lumbar region: Secondary | ICD-10-CM | POA: Diagnosis not present

## 2021-07-02 NOTE — Telephone Encounter (Signed)
Last Prolia inj 06/21/21 Next Prolia inj due 12/20/21

## 2021-07-07 ENCOUNTER — Encounter: Payer: Self-pay | Admitting: Internal Medicine

## 2021-07-21 ENCOUNTER — Encounter: Payer: Medicare HMO | Admitting: Adult Health

## 2021-07-21 NOTE — Progress Notes (Deleted)
Complete Physical  Assessment and Plan:  Isabel [name] was seen today for annual exam.  Diagnoses and all orders for this visit:  Encounter for Annual Physical Exam with abnormal findings Due annually  Health Maintenance reviewed Healthy lifestyle reviewed and goals set ***  Other osteoporosis without current pathological fracture -     VITAMIN D 25 Hydroxy (Vit-D Deficiency, Fractures) -     CBC with Differential/Platelet She is receiving Prolia Q49months via Dr. Cruzita Lederer and is taking Calcium and Vit D.  Hypothyroidism, unspecified type -     TSH continue medications the same, reminded to take on an empty stomach 30-57mins before food.  Will contact with results  Mixed hyperlipidemia -     Lipid panel Taking Crestor  decrease fatty foods increase activity.  Continue OTC Fish Oil  Other migraine without status migrainosus, not intractable Managed by neurology  She is receiving monthly Aimovig injections with good results Has Zolitriptan PRN  Hx of Prediabetes/screening diabetes -     Hemoglobin A1c  Vitamin D deficiency -     VITAMIN D 25 Hydroxy (Vit-D Deficiency, Fractures) Taking daily supplimentation, continue  Gastroesophageal reflux disease, esophagitis presence not specified Continue PPI/H2i; do food log to identify triggers Discussed diet, avoiding triggers and other lifestyle changes  Hypertension - continue medications, DASH diet, exercise and monitor at home. Call if greater than 130/80.  Patient to monitor at home and contact office if elevation is consistent        - EKG . RBBB on EKG IRBBB this year; continue to monitor   Medication management        -     Magnesium  -     TSH -     COMPLETE METABOLIC PANEL WITH GFR -     Urinalysis w microscopic + reflex cultur  BMI <19 Weight gain encouraged; boost, ensure, high protein calorie diet reviewed and recommended Aim to get back up to baseline 100 lb Check CMP/GFR   No orders of the defined types  were placed in this encounter.    Discussed med's effects and SE's. Screening labs and tests as requested with regular follow-up as recommended. Over 40 minutes of exam, counseling, chart review, and complex, high level critical decision making was performed this visit.   Future Appointments  Date Time Provider [place]  07/21/2021  3:00 PM Liane Comber, NP [facility] None  08/22/2021  1:30 PM Marcial Pacas, MD [facility] None  11/25/2021  2:00 PM Philemon Kingdom, MD [facility] None  07/26/2022  3:00 PM Liane Comber, NP [facility] None     HPI  65 y.o. female  presents for a complete physical and follow up for has Hypertension; Hyperlipidemia; GERD (gastroesophageal reflux disease); Cervical spondylosis without myelopathy; Headache, migraine; Prediabetes; Vitamin D deficiency; Medication management; Age-related osteoporosis without current pathological fracture; Hypothyroidism; Insomnia; Right bundle branch block (RBBB) on electrocardiogram (ECG); Adult BMI <19 kg/sq m; CKD (chronic kidney disease) stage 2, GFR 60-89 ml/min; and Cognitive impairment on their problem list.   She is married, no children, has 2 cats, 2 dogs, retired.   She follows with GYN every 2-3 years for PAP smears, Dr. Alan Ripper office, just saw a few months ago.   She has had migraines since she was 65 y/o; she is on amlodipine 5 mg and aimovig injection for migraines. She uses zolmitriptan as abortive which works well for her but needs rarely since on aimovig. Dr. Sima Matas moved, now prescribed out of our office. Recenty reported memory changes,  persistent poor short term recall over the pandemic and recently referred to est with new neurologist [name], had MRI showing advanced for age microvascular changes, was dx with cognitive impairement and has been initiated on aricept/nemenda ***  She is off of valium, doing well with trazodone 75 mg daily for sleep.   She is seeing a new provider at [facility] for chronic back  pain, getting injections which have been helpful, but doesn't last very long.   she has a diagnosis of GERD which is currently managed by lifestyle, rare breakthrough, takes tums occasionally.   BMI is There is no height or weight on file to calculate BMI., she has been working on diet and exercise, and walking more with her though limited by lower back pain. Admits was not eating as much for a period Wt Readings from Last 3 Encounters:  05/04/21 98 lb (44.5 kg)  02/02/21 90 lb 3.2 oz (40.9 kg)  11/25/20 90 lb 3.2 oz (40.9 kg)   Her blood pressure has been controlled at home, today their BP is   She does workout in the form of walking. She denies chest pain, shortness of breath, dizziness.   She is on cholesterol medication (rosuvastatin 20 mg daily)  and denies myalgias. Her cholesterol is not at goal. The cholesterol last visit was:   Lab Results  Component Value Date   CHOL 198 02/02/2021   HDL 75 02/02/2021   LDLCALC 99 02/02/2021   TRIG 139 02/02/2021   CHOLHDL 2.6 02/02/2021   She has been working on diet and exercise for hx of prediabetes, she is not on bASA, she is not on ACE/ARB and denies nausea, polydipsia and polyuria. Last A1C in the office was:  Lab Results  Component Value Date   HGBA1C 5.5 02/02/2021   She is on thyroid medication. Her medication was not changed last visit.  Taking 25 mcg daily.  Lab Results  Component Value Date   TSH 1.33 02/02/2021   Last GFR: Lab Results  Component Value Date   GFRNONAA 67 02/02/2021   Patient is on Vitamin D supplement, now taking 1000 mcg, was recommende dreduced dose by Dr. Cruzita Lederer. She manages osteoporosis, getting prolia injections. Last DEXA was *** 2019, Dr. Darnell Level has ordered and pending scheduling Lab Results  Component Value Date   VD25OH 54 02/02/2021      Lab Results  Component Value Date   [id number] 220 10/27/2020       Current Medications:  Current Outpatient Medications on File Prior to Visit   Medication Sig Dispense Refill   AIMOVIG 140 MG/ML SOAJ Inject 140 mg into the skin every 30 (thirty) days. 1.12 mL 9   amLODipine (NORVASC) 5 MG tablet TAKE ONE TABLET BY MOUTH ONE TIME DAILY 90 tablet 3   Cholecalciferol 25 MCG (1000 UT) capsule Take 1 capsule (1,000 Units total) by mouth daily.     denosumab (PROLIA) 60 MG/ML SOSY injection Inject 60 mg into the skin every 6 (six) months.     donepezil (ARICEPT) 10 MG tablet Take 1 tablet (10 mg total) by mouth at bedtime. 30 tablet 11   levothyroxine (SYNTHROID) 25 MCG tablet Take  1 tablet  Daily  on an empty stomach with only water for 30 minutes & no Antacid meds, Calcium or Magnesium for 4 hours & avoid Biotin 90 tablet 3   memantine (NAMENDA) 10 MG tablet Take 1 tablet (10 mg total) by mouth 2 (two) times daily. 60 tablet 11  rosuvastatin (CRESTOR) 20 MG tablet TAKE ONE TABLET BY MOUTH ONE TIME DAILY FOR CHOLESTEROL 90 tablet 1   traZODone (DESYREL) 150 MG tablet Take  1/2 to 1 tablet  1 hour  before Bedtime  as needed for Sleep 90 tablet 1   zolmitriptan (ZOMIG-ZMT) 2.5 MG disintegrating tablet DIS ONE T PO UTD PRF MIGRAINE  5   No current facility-administered medications on file prior to visit.   Allergies:  Allergies  Allergen Reactions   Aleve [Naproxen Sodium] Rash        Lipitor [Atorvastatin] Other (See Comments)   Niacin And Related Anaphylaxis   Omnaris [Ciclesonide] Other (See Comments)    headache   Ceftin [Cefuroxime Axetil] Rash   Other Other (See Comments)   Levaquin [Levofloxacin In D5w]     Muscle cramps   Simvastatin    Zetia [Ezetimibe]    Medical History:  She has Hypertension; Hyperlipidemia; GERD (gastroesophageal reflux disease); Cervical spondylosis without myelopathy; Headache, migraine; Prediabetes; Vitamin D deficiency; Medication management; Age-related osteoporosis without current pathological fracture; Hypothyroidism; Insomnia; Right bundle branch block (RBBB) on electrocardiogram (ECG); Adult  BMI <19 kg/sq m; CKD (chronic kidney disease) stage 2, GFR 60-89 ml/min; and Cognitive impairment on their problem list. Health Maintenance:   Immunization History  Administered Date(s) Administered   Moderna Sars-Covid-2 Vaccination 09/12/2019, 10/10/2019, 05/22/2020   PPD Test 09/08/2013   Pneumococcal-Unspecified 08/01/2007   Tdap 07/31/2011   Zoster, Live 11/21/2016    Tetanus: 2012, Due 2022 Pneumovax: 2009 Flu vaccine: Declined Zostavax: 2018 Covid 19: 3/3, 2021, moderna  LMP: Post-Menopausal Pap: 2017, due 2020, patient states will schedule follow up MGM: ? 04/02/2020 by GYN? Pending receipt of report DEXA: 2019 Colonoscopy: Due 2023  Last Dental Exam: 2020, encouraged to follow up Last Eye Exam:  2020, glasses   Patient Care Team: Unk Pinto, MD as PCP - General (Internal Medicine) Ladene Artist, MD as Consulting Physician (Gastroenterology) Fay Records, MD as Consulting Physician (Cardiology) Jessy Oto, MD as Consulting Physician (Orthopedic Surgery) Deneise Lever, MD as Consulting Physician (Pulmonary Disease) Amil Amen, MD as Referring Physician (Neurology) Dorene Ar, MD (Pain Medicine)  Surgical History:  She has a past surgical history that includes abdominal wall tear; Mouth surgery; fractured right wrist; Wrist surgery (Right, 1973); and LEEP. Family History:  Herfamily history includes Alzheimer's disease in her father; Depression in her mother; Hyperlipidemia in her father; Hypertension in her father; Migraines in her brother, mother, and sister; Stroke in her mother. Social History:  She reports that she quit smoking about 27 years ago. Her smoking use included cigarettes. She started smoking about 47 years ago. She has a 19.00 pack-year smoking history. She has never used smokeless tobacco. She reports that she does not drink alcohol and does not use drugs.  Review of Systems: Review of Systems  Constitutional:  Negative for  malaise/fatigue and weight loss.  HENT:  Negative for hearing loss and tinnitus.   Eyes:  Negative for blurred vision and double vision.  Respiratory:  Negative for cough, sputum production, shortness of breath and wheezing.   Cardiovascular:  Negative for chest pain, palpitations, orthopnea, claudication, leg swelling and PND.  Gastrointestinal:  Negative for abdominal pain, blood in stool, constipation, diarrhea, heartburn, melena, nausea and vomiting.  Genitourinary: Negative.   Musculoskeletal:  Positive for back pain. Negative for falls, joint pain and myalgias.  Skin:  Negative for rash.  Neurological:  Positive for headaches. Negative for dizziness, tingling, sensory change and weakness.  Endo/Heme/Allergies:  Negative for polydipsia.  Psychiatric/Behavioral:  Positive for memory loss (memory changes). Negative for depression, substance abuse and suicidal ideas. The patient is not nervous/anxious and does not have insomnia.   All other systems reviewed and are negative.  Physical Exam: Estimated body mass index is 18.52 kg/m as calculated from the following:   Height as of 05/04/21: 5\' 1"  (1.549 m).   Weight as of 05/04/21: 98 lb (44.5 kg). There were no vitals taken for this visit. General Appearance: Well nourished, in no apparent distress.  Eyes: PERRLA, EOMs, conjunctiva no swelling or erythema Sinuses: No Frontal/maxillary tenderness  ENT/Mouth: Ext aud canals clear, normal light reflex with TMs without erythema, bulging. Good dentition. No erythema, swelling, or exudate on post pharynx. Tonsils not swollen or erythematous. Hearing normal.   Neck: Supple, thyroid normal. No bruits  Respiratory: Respiratory effort normal, BS equal bilaterally without rales, rhonchi, wheezing or stridor.  Cardio: RRR without murmurs, rubs or gallops. Brisk peripheral pulses without edema.  Chest: symmetric, with normal excursions and percussion.  Breasts: defer to GYN Abdomen: Soft, nontender, no  guarding, rebound, hernias, masses, or organomegaly.  Lymphatics: Non tender without lymphadenopathy.  Genitourinary: defer to GYN Musculoskeletal: Full ROM all peripheral extremities,5/5 strength, and normal gait. Pain with palpation to cervical and lumbar spine. Skin: Warm, dry without rashes, lesions, ecchymosis. Neuro: Cranial nerves intact, reflexes equal bilaterally. Normal muscle tone, no cerebellar symptoms. Sensation intact.  Psych: Awake and oriented X 3, normal affect, Insight and Judgment appropriate.   EKG: Sinus rhythm, IRBBB     Izora Ribas, NP 1:26 PM [facility]

## 2021-07-26 ENCOUNTER — Other Ambulatory Visit: Payer: Self-pay | Admitting: Adult Health

## 2021-07-26 DIAGNOSIS — E782 Mixed hyperlipidemia: Secondary | ICD-10-CM

## 2021-07-27 NOTE — Progress Notes (Deleted)
Complete Physical  Assessment and Plan:  Isabel [name] was seen today for annual exam.  Diagnoses and all orders for this visit:  Encounter for annual physical exam Due Yearly  Other osteoporosis without current pathological fracture -     VITAMIN D 25 Hydroxy (Vit-D Deficiency, Fractures) -     CBC with Differential/Platelet She is receiving Prolia Q2months via Dr. Cruzita Lederer and is taking Calcium and Vit D.  Hypothyroidism, unspecified type -     TSH continue medications the same, reminded to take on an empty stomach 30-92mins before food.  Will contact with results  Mixed hyperlipidemia -     Lipid panel - CMP Taking Crestor  decrease fatty foods increase activity.  Continue OTC Fish Oil  Other migraine without status migrainosus, not intractable Managed by neurology  She is receiving monthly Aimovig injections with good results Has Zolitriptan PRN  Hx of Prediabetes/screening diabetes -     Hemoglobin A1c  Vitamin D deficiency -     VITAMIN D 25 Hydroxy (Vit-D Deficiency, Fractures) Taking daily supplimentation, continue  Gastroesophageal reflux disease, esophagitis presence not specified Continue PPI/H2i; do food log to identify triggers Discussed diet, avoiding triggers and other lifestyle changes  Hypertension - continue medications, DASH diet, exercise and monitor at home. Call if greater than 130/80.  Patient to monitor at home and contact office if elevation is consistent        - EKG . RBBB on EKG IRBBB this year; continue to monitor   Medication management        -     Magnesium  -     TSH -     COMPLETE METABOLIC PANEL WITH GFR -     Urinalysis w microscopic + reflex cultur  BMI <19 Weight gain encouraged; boost, ensure, high protein calorie diet reviewed and recommended Aim to get back up to baseline 100 lb Check CMP/GFR  Screening for ischemic heart disease EKG  Screening for hematuria or proteinuria - Urine routine with reflex microscopic -  Microalbumin/Creatinine urine ratio   No orders of the defined types were placed in this encounter.    Discussed med's effects and SE's. Screening labs and tests as requested with regular follow-up as recommended. Over 40 minutes of exam, counseling, chart review, and complex, high level critical decision making was performed this visit.   Future Appointments  Date Time Provider [place]  07/28/2021  3:00 PM Magda Bernheim, NP [facility] None  08/22/2021  1:30 PM Marcial Pacas, MD [facility] None  11/25/2021  2:00 PM Philemon Kingdom, MD [facility] None     HPI  65 y.o. female  presents for a complete physical and follow up for has Hypertension; Hyperlipidemia; GERD (gastroesophageal reflux disease); Cervical spondylosis without myelopathy; Headache, migraine; Other abnormal glucose (prediabetes); Vitamin D deficiency; Medication management; Age-related osteoporosis without current pathological fracture; Hypothyroidism; Insomnia; Right bundle branch block (RBBB) on electrocardiogram (ECG); Adult BMI <19 kg/sq m; CKD (chronic kidney disease) stage 2, GFR 60-89 ml/min; and Cognitive impairment on their problem list.   She is married, no children, has 2 cats, 2 dogs, retired.   She follows with GYN every 2-3 years for PAP smears, Dr. Alan Ripper office, just saw a few months ago.   She has had migraines since she was 65 y/o; she is on amlodipine 5 mg and aimovig injection for migraines. She uses zolmitriptan as abortive which works well for her. Recently improved.  She was following with Dr. Sima Matas but she moved, now prescribed  meds from our office; she reports 1 migraine in the last 3 months.   She was able to transition off of valium for sleep with trazodone, taking 50 mg, does wake up a bit too early.   She is followed by [facility] for chronic back pain, getting injections which have been helpful. Planning a nerve ablation in Jan 2022.   she has a diagnosis of GERD which is currently managed by  omeprazole 20 mg PRN, estimates 2 days/week. she reports symptoms is currently well controlled, and denies breakthrough reflux, burning in chest, hoarseness or cough.    She follows with Dr. Cruzita Lederer for prolia injections, last DEXA 2019 lumbar T -3.2. Overdue for bone density, order in system, patient will schedule.   BMI is There is no height or weight on file to calculate BMI., she has been working on diet and exercise, and walking more with her though limited by lower back pain. Admits was not eating as much for a period Wt Readings from Last 3 Encounters:  05/04/21 98 lb (44.5 kg)  02/02/21 90 lb 3.2 oz (40.9 kg)  11/25/20 90 lb 3.2 oz (40.9 kg)   Her blood pressure has been controlled at home, today their BP is   She does workout in the form of walking. She denies chest pain, shortness of breath, dizziness.   She is on cholesterol medication (rosuvastatin 20 mg daily)  and denies myalgias. Her cholesterol is not at goal. The cholesterol last visit was:   Lab Results  Component Value Date   CHOL 198 02/02/2021   HDL 75 02/02/2021   LDLCALC 99 02/02/2021   TRIG 139 02/02/2021   CHOLHDL 2.6 02/02/2021   She has been working on diet and exercise for hx of prediabetes, she is not on bASA, she is not on ACE/ARB and denies nausea, polydipsia and polyuria. Last A1C in the office was:  Lab Results  Component Value Date   HGBA1C 5.5 02/02/2021   She is on thyroid medication. Her medication was not changed last visit.  Taking 25 mcg daily.  Lab Results  Component Value Date   TSH 1.33 02/02/2021   Last GFR: Lab Results  Component Value Date   GFRNONAA 67 02/02/2021   Patient is on Vitamin D supplement, now taking 1000 mcg, was recommende dreduced dose by Dr. Cruzita Lederer  Lab Results  Component Value Date   VD25OH 54 02/02/2021        Current Medications:  Current Outpatient Medications on File Prior to Visit  Medication Sig Dispense Refill   AIMOVIG 140 MG/ML SOAJ Inject 140 mg  into the skin every 30 (thirty) days. 1.12 mL 9   amLODipine (NORVASC) 5 MG tablet TAKE ONE TABLET BY MOUTH ONE TIME DAILY 90 tablet 3   Cholecalciferol 25 MCG (1000 UT) capsule Take 1 capsule (1,000 Units total) by mouth daily.     denosumab (PROLIA) 60 MG/ML SOSY injection Inject 60 mg into the skin every 6 (six) months.     donepezil (ARICEPT) 10 MG tablet Take 1 tablet (10 mg total) by mouth at bedtime. 30 tablet 11   levothyroxine (SYNTHROID) 25 MCG tablet Take  1 tablet  Daily  on an empty stomach with only water for 30 minutes & no Antacid meds, Calcium or Magnesium for 4 hours & avoid Biotin 90 tablet 3   memantine (NAMENDA) 10 MG tablet Take 1 tablet (10 mg total) by mouth 2 (two) times daily. 60 tablet 11   rosuvastatin (CRESTOR) 20  MG tablet TAKE ONE TABLET BY MOUTH ONE TIME DAILY FOR FOR CHOLESTEROL 90 tablet 1   traZODone (DESYREL) 150 MG tablet Take  1/2 to 1 tablet  1 hour  before Bedtime  as needed for Sleep 90 tablet 1   zolmitriptan (ZOMIG-ZMT) 2.5 MG disintegrating tablet DIS ONE T PO UTD PRF MIGRAINE  5   No current facility-administered medications on file prior to visit.   Allergies:  Allergies  Allergen Reactions   Aleve [Naproxen Sodium] Rash        Lipitor [Atorvastatin] Other (See Comments)   Niacin And Related Anaphylaxis   Omnaris [Ciclesonide] Other (See Comments)    headache   Ceftin [Cefuroxime Axetil] Rash   Other Other (See Comments)   Levaquin [Levofloxacin In D5w]     Muscle cramps   Simvastatin    Zetia [Ezetimibe]    Medical History:  She has Hypertension; Hyperlipidemia; GERD (gastroesophageal reflux disease); Cervical spondylosis without myelopathy; Headache, migraine; Other abnormal glucose (prediabetes); Vitamin D deficiency; Medication management; Age-related osteoporosis without current pathological fracture; Hypothyroidism; Insomnia; Right bundle branch block (RBBB) on electrocardiogram (ECG); Adult BMI <19 kg/sq m; CKD (chronic kidney  disease) stage 2, GFR 60-89 ml/min; and Cognitive impairment on their problem list. Health Maintenance:   Immunization History  Administered Date(s) Administered   Moderna Sars-Covid-2 Vaccination 09/12/2019, 10/10/2019, 05/22/2020   PPD Test 09/08/2013   Pneumococcal-Unspecified 08/01/2007   Tdap 07/31/2011   Zoster, Live 11/21/2016    Tetanus: 2012, Due 2022 Pneumovax: 2009 Flu vaccine: Declined Zostavax: 2018 Covid 19: 3/3, 2021, moderna  LMP: Post-Menopausal Pap: 2017, due 2020, patient states will schedule follow up MGM: ? 04/02/2020 by GYN? Pending receipt of report DEXA: 2019 Colonoscopy: Due 2023  Last Dental Exam: 2020, encouraged to follow up Last Eye Exam:  2020, glasses   Patient Care Team: Unk Pinto, MD as PCP - General (Internal Medicine) Ladene Artist, MD as Consulting Physician (Gastroenterology) Fay Records, MD as Consulting Physician (Cardiology) Jessy Oto, MD as Consulting Physician (Orthopedic Surgery) Deneise Lever, MD as Consulting Physician (Pulmonary Disease) Amil Amen, MD as Referring Physician (Neurology) Dorene Ar, MD (Pain Medicine)  Surgical History:  She has a past surgical history that includes abdominal wall tear; Mouth surgery; fractured right wrist; Wrist surgery (Right, 1973); and LEEP. Family History:  Herfamily history includes Alzheimer's disease in her father; Depression in her mother; Hyperlipidemia in her father; Hypertension in her father; Migraines in her brother, mother, and sister; Stroke in her mother. Social History:  She reports that she quit smoking about 27 years ago. Her smoking use included cigarettes. She started smoking about 47 years ago. She has a 19.00 pack-year smoking history. She has never used smokeless tobacco. She reports that she does not drink alcohol and does not use drugs.  Review of Systems: Review of Systems  Constitutional:  Negative for chills and fever.  HENT:  Negative  for congestion, hearing loss, sinus pain, sore throat and tinnitus.   Eyes:  Negative for blurred vision and double vision.  Respiratory:  Negative for cough, hemoptysis, sputum production, shortness of breath and wheezing.   Cardiovascular:  Negative for chest pain, palpitations and leg swelling.  Gastrointestinal:  Negative for abdominal pain, constipation, diarrhea, heartburn, nausea and vomiting.  Genitourinary:  Negative for dysuria and urgency.  Musculoskeletal:  Negative for back pain, falls, joint pain, myalgias and neck pain.  Skin:  Negative for rash.  Neurological:  Negative for dizziness, tingling, tremors, weakness and headaches.  Endo/Heme/Allergies:  Does not bruise/bleed easily.  Psychiatric/Behavioral:  Negative for depression and suicidal ideas. The patient is not nervous/anxious and does not have insomnia.    Physical Exam: Estimated body mass index is 18.52 kg/m as calculated from the following:   Height as of 05/04/21: 5\' 1"  (1.549 m).   Weight as of 05/04/21: 98 lb (44.5 kg). There were no vitals taken for this visit. General Appearance: Well nourished, in no apparent distress.  Eyes: PERRLA, EOMs, conjunctiva no swelling or erythema Sinuses: No Frontal/maxillary tenderness  ENT/Mouth: Ext aud canals clear, normal light reflex with TMs without erythema, bulging. Good dentition. No erythema, swelling, or exudate on post pharynx. Tonsils not swollen or erythematous. Hearing normal.   Neck: Supple, thyroid normal. No bruits  Respiratory: Respiratory effort normal, BS equal bilaterally without rales, rhonchi, wheezing or stridor.  Cardio: RRR without murmurs, rubs or gallops. Brisk peripheral pulses without edema.  Chest: symmetric, with normal excursions and percussion.  Breasts: defer to GYN Abdomen: Soft, nontender, no guarding, rebound, hernias, masses, or organomegaly.  Lymphatics: Non tender without lymphadenopathy.  Genitourinary: defer to GYN Musculoskeletal:  Full ROM all peripheral extremities,5/5 strength, and normal gait. Pain with palpation to cervical and lumbar spine. Skin: Warm, dry without rashes, lesions, ecchymosis. Neuro: Cranial nerves intact, reflexes equal bilaterally. Normal muscle tone, no cerebellar symptoms. Sensation intact.  Psych: Awake and oriented X 3, normal affect, Insight and Judgment appropriate.   EKG: Sinus rhythm, IRBBB     Jordanna Hendrie Kathyrn Drown, NP 12:47 PM [facility]

## 2021-07-28 ENCOUNTER — Encounter: Payer: Medicare HMO | Admitting: Nurse Practitioner

## 2021-08-05 ENCOUNTER — Encounter: Payer: Self-pay | Admitting: Adult Health

## 2021-08-05 ENCOUNTER — Ambulatory Visit (INDEPENDENT_AMBULATORY_CARE_PROVIDER_SITE_OTHER): Payer: Medicare HMO | Admitting: Adult Health

## 2021-08-05 ENCOUNTER — Other Ambulatory Visit: Payer: Self-pay

## 2021-08-05 VITALS — BP 126/90 | HR 82 | Temp 97.5°F | Ht 61.0 in | Wt 90.6 lb

## 2021-08-05 DIAGNOSIS — Z79899 Other long term (current) drug therapy: Secondary | ICD-10-CM | POA: Diagnosis not present

## 2021-08-05 DIAGNOSIS — E039 Hypothyroidism, unspecified: Secondary | ICD-10-CM

## 2021-08-05 DIAGNOSIS — Z131 Encounter for screening for diabetes mellitus: Secondary | ICD-10-CM | POA: Diagnosis not present

## 2021-08-05 DIAGNOSIS — E782 Mixed hyperlipidemia: Secondary | ICD-10-CM

## 2021-08-05 DIAGNOSIS — K219 Gastro-esophageal reflux disease without esophagitis: Secondary | ICD-10-CM

## 2021-08-05 DIAGNOSIS — G43809 Other migraine, not intractable, without status migrainosus: Secondary | ICD-10-CM

## 2021-08-05 DIAGNOSIS — R7309 Other abnormal glucose: Secondary | ICD-10-CM

## 2021-08-05 DIAGNOSIS — Z1329 Encounter for screening for other suspected endocrine disorder: Secondary | ICD-10-CM | POA: Diagnosis not present

## 2021-08-05 DIAGNOSIS — Z0000 Encounter for general adult medical examination without abnormal findings: Secondary | ICD-10-CM

## 2021-08-05 DIAGNOSIS — Z136 Encounter for screening for cardiovascular disorders: Secondary | ICD-10-CM | POA: Diagnosis not present

## 2021-08-05 DIAGNOSIS — I4510 Unspecified right bundle-branch block: Secondary | ICD-10-CM | POA: Diagnosis not present

## 2021-08-05 DIAGNOSIS — I10 Essential (primary) hypertension: Secondary | ICD-10-CM | POA: Diagnosis not present

## 2021-08-05 DIAGNOSIS — M810 Age-related osteoporosis without current pathological fracture: Secondary | ICD-10-CM

## 2021-08-05 DIAGNOSIS — G4700 Insomnia, unspecified: Secondary | ICD-10-CM

## 2021-08-05 DIAGNOSIS — Z23 Encounter for immunization: Secondary | ICD-10-CM | POA: Diagnosis not present

## 2021-08-05 DIAGNOSIS — E559 Vitamin D deficiency, unspecified: Secondary | ICD-10-CM | POA: Diagnosis not present

## 2021-08-05 DIAGNOSIS — D649 Anemia, unspecified: Secondary | ICD-10-CM

## 2021-08-05 DIAGNOSIS — Z0001 Encounter for general adult medical examination with abnormal findings: Secondary | ICD-10-CM

## 2021-08-05 DIAGNOSIS — N182 Chronic kidney disease, stage 2 (mild): Secondary | ICD-10-CM | POA: Diagnosis not present

## 2021-08-05 DIAGNOSIS — M47812 Spondylosis without myelopathy or radiculopathy, cervical region: Secondary | ICD-10-CM

## 2021-08-05 DIAGNOSIS — Z681 Body mass index (BMI) 19 or less, adult: Secondary | ICD-10-CM

## 2021-08-05 MED ORDER — MIRTAZAPINE 7.5 MG PO TABS: 7.5000 mg | ORAL_TABLET | Freq: Every day | ORAL | 0 refills | Status: DC

## 2021-08-05 MED ORDER — TRAZODONE HCL 150 MG PO TABS: ORAL_TABLET | ORAL | 1 refills | Status: DC

## 2021-08-05 NOTE — Progress Notes (Signed)
Complete Physical  Assessment and Plan:  Patric was seen today for annual exam.  Diagnoses and all orders for this visit:  Encounter for Annual Physical Exam with abnormal findings Due annually  Health Maintenance reviewed Healthy lifestyle reviewed and goals set  Other osteoporosis without current pathological fracture -     VITAMIN D 25 Hydroxy (Vit-D Deficiency, Fractures) -     CBC with Differential/Platelet She is receiving Prolia Q24months via Dr. Cruzita Lederer and is taking Calcium and Vit D. Has follow up DEXA planned next visit.   Hypothyroidism, unspecified type -     TSH continue medications the same, reminded to take on an empty stomach 30-39mins before food.  Will contact with results  Mixed hyperlipidemia -     Lipid panel Taking Crestor  decrease fatty foods increase activity.  Continue OTC Fish Oil  Other migraine without status migrainosus, not intractable Managed by neurology  She is receiving monthly Aimovig injections with good results Wants to try med break, ok to hold aimovig and monitor results. Contact office if frequent recurrent migraine and will restart Has Zolitriptan PRN  Hx of Prediabetes/screening diabetes -     Hemoglobin A1c  Vitamin D deficiency -     VITAMIN D 25 Hydroxy (Vit-D Deficiency, Fractures) Taking daily supplimentation, continue  Gastroesophageal reflux disease, esophagitis presence not specified Continue PPI/H2i; do food log to identify triggers Discussed diet, avoiding triggers and other lifestyle changes  Hypertension - continue medications, DASH diet, exercise and monitor at home. Call if greater than 130/80.  Patient to monitor at home and contact office if elevation is consistent        - EKG . RBBB on EKG IRBBB, monitor   Medication management        -     Magnesium  -     TSH -     COMPLETE METABOLIC PANEL WITH GFR -     Urinalysis w microscopic + reflex cultur  BMI <19 Weight gain encouraged; boost, ensure,  high protein calorie diet reviewed and recommended Aim to get back up to baseline 100 lb Will restart low dose remeron, has done well in the past, try 7.5 mg Check CMP/GFR  Cognitive impairement Poor short term memory and attention/calculation [name] following Continue aricept/namenda Does have chronic microvascular changes - vascular health, exercise, mind diet, memory exercises reviewed Monitor  Need for influenza vaccine High dose quadrivalent administered without complication today   Need for pneumonia vaccination - plan at next OV   Orders Placed This Encounter  Procedures   Flu vaccine HIGH DOSE PF (Fluzone High dose)   CBC with Differential/Platelet   COMPLETE METABOLIC PANEL WITH GFR   Magnesium   Lipid panel   TSH   Hemoglobin A1c   Microalbumin / creatinine urine ratio   Urinalysis, Routine w reflex microscopic   Vitamin B12   Iron, TIBC and Ferritin Panel   VITAMIN D 25 Hydroxy (Vit-D Deficiency, Fractures)   EKG 12-Lead     Discussed med's effects and SE's. Screening labs and tests as requested with regular follow-up as recommended. Over 40 minutes of exam, counseling, chart review, and complex, high level critical decision making was performed this visit.   Future Appointments  Date Time Provider [place]  08/22/2021  1:30 PM Marcial Pacas, MD [facility] None  11/25/2021  2:00 PM Philemon Kingdom, MD [facility] None  12/14/2021  2:30 PM Liane Comber, NP [facility] None  08/08/2022 10:00 AM Liane Comber, NP [facility] None  HPI  66 y.o. female  presents for a complete physical and follow up for has Hypertension; Hyperlipidemia; GERD (gastroesophageal reflux disease); Cervical spondylosis without myelopathy; Headache, migraine; Other abnormal glucose (prediabetes); Vitamin D deficiency; Medication management; Age-related osteoporosis without current pathological fracture; Hypothyroidism; Insomnia; Right bundle branch block (RBBB) on  electrocardiogram (ECG); Adult BMI <19 kg/sq m; CKD (chronic kidney disease) stage 2, GFR 60-89 ml/min; and Cognitive impairment on their problem list.   She is married, no children, has 2 cats, 2 dogs, retired.   She follows with GYN every 2-3 years for PAP smears, Dr. Alan Ripper office, saw her 03/2020.   She has had migraines since she was 66 y/o; she is on amlodipine 5 mg and aimovig injection for migraines. She uses zolmitriptan as abortive which works well for her but needs rarely since on aimovig. Dr. Sima Matas moved, now prescribed out of our office. She reports no migraines in many months now, aimovig is expensive, would like to try coming off.   Significant memory changes in recent years, persistent poor short term recall over the pandemic and recently referred to est with new neurologist [name], had MRI showing advanced for age microvascular changes, was dx with cognitive impairement and has been initiated on aricept/nemenda. Had MoCA of 12/30 on 05/04/2021 by [name], [place] of 24/30 today. Trying to make healthy lifestyle changes, diet and exercise.   She is off of valium, doing well with trazodone 75 mg daily for sleep.   She is seeing a new provider at [facility] for chronic back pain, getting injections/ablations which have been helpful, but doesn't last very long. Recently had an ablation that made pain significantly worse, was very unactive and eating poorly for several months, lost a lot of weight, has improved in last few weeks but interested in restarting remeron which worked well for her in the past.   she has a diagnosis of GERD which is currently managed by lifestyle, rare breakthrough, takes tums occasionally.   BMI is Body mass index is 17.12 kg/m., she has been working on diet and exercise, and walking more with her though limited by lower back pain. Admits was not eating as much for a period Wt Readings from Last 3 Encounters:  08/05/21 90 lb 9.6 oz (41.1 kg)  05/04/21 98 lb (44.5 kg)   02/02/21 90 lb 3.2 oz (40.9 kg)   Her blood pressure has been controlled at home, today their BP is BP: 126/90 She does workout in the form of walking. She denies chest pain, shortness of breath, dizziness.   She is on cholesterol medication (rosuvastatin 20 mg daily)  and denies myalgias. Her cholesterol is not at goal. The cholesterol last visit was:   Lab Results  Component Value Date   CHOL 198 02/02/2021   HDL 75 02/02/2021   LDLCALC 99 02/02/2021   TRIG 139 02/02/2021   CHOLHDL 2.6 02/02/2021   She has been working on diet and exercise for hx of prediabetes, she is not on bASA, she is not on ACE/ARB and denies nausea, polydipsia and polyuria. Last A1C in the office was:  Lab Results  Component Value Date   HGBA1C 5.5 02/02/2021   She is on thyroid medication. Her medication was not changed last visit.  Taking 25 mcg daily.  Lab Results  Component Value Date   TSH 1.33 02/02/2021   Last GFR: Lab Results  Component Value Date   GFRNONAA 67 02/02/2021   Patient is on Vitamin D supplement, now  taking 1000 mcg, was recommende dreduced dose by Dr. Cruzita Lederer. She manages osteoporosis, getting prolia injections. Last DEXA was 2019, Dr. Darnell Level has ordered and planning to get at 10/2021 follow up.  Lab Results  Component Value Date   VD25OH 54 02/02/2021       Current Medications:  Current Outpatient Medications on File Prior to Visit  Medication Sig Dispense Refill   AIMOVIG 140 MG/ML SOAJ Inject 140 mg into the skin every 30 (thirty) days. 1.12 mL 9   amLODipine (NORVASC) 5 MG tablet TAKE ONE TABLET BY MOUTH ONE TIME DAILY 90 tablet 3   Cholecalciferol 25 MCG (1000 UT) capsule Take 1 capsule (1,000 Units total) by mouth daily.     denosumab (PROLIA) 60 MG/ML SOSY injection Inject 60 mg into the skin every 6 (six) months.     donepezil (ARICEPT) 10 MG tablet Take 1 tablet (10 mg total) by mouth at bedtime. 30 tablet 11   levothyroxine (SYNTHROID) 25 MCG tablet Take  1 tablet  Daily   on an empty stomach with only water for 30 minutes & no Antacid meds, Calcium or Magnesium for 4 hours & avoid Biotin 90 tablet 3   memantine (NAMENDA) 10 MG tablet Take 1 tablet (10 mg total) by mouth 2 (two) times daily. 60 tablet 11   rosuvastatin (CRESTOR) 20 MG tablet TAKE ONE TABLET BY MOUTH ONE TIME DAILY FOR FOR CHOLESTEROL 90 tablet 1   zolmitriptan (ZOMIG-ZMT) 2.5 MG disintegrating tablet DIS ONE T PO UTD PRF MIGRAINE (Patient not taking: Reported on 08/05/2021)  5   No current facility-administered medications on file prior to visit.   Allergies:  Allergies  Allergen Reactions   Aleve [Naproxen Sodium] Rash        Lipitor [Atorvastatin] Other (See Comments)   Niacin And Related Anaphylaxis   Omnaris [Ciclesonide] Other (See Comments)    headache   Ceftin [Cefuroxime Axetil] Rash   Other Other (See Comments)   Levaquin [Levofloxacin In D5w]     Muscle cramps   Simvastatin    Zetia [Ezetimibe]    Medical History:  She has Hypertension; Hyperlipidemia; GERD (gastroesophageal reflux disease); Cervical spondylosis without myelopathy; Headache, migraine; Other abnormal glucose (prediabetes); Vitamin D deficiency; Medication management; Age-related osteoporosis without current pathological fracture; Hypothyroidism; Insomnia; Right bundle branch block (RBBB) on electrocardiogram (ECG); Adult BMI <19 kg/sq m; CKD (chronic kidney disease) stage 2, GFR 60-89 ml/min; and Cognitive impairment on their problem list. Health Maintenance:   Immunization History  Administered Date(s) Administered   Influenza, High Dose Seasonal PF 08/05/2021   Moderna Covid-19 Vaccine Bivalent Booster 36yrs & up 04/06/2021   Moderna SARS-COV2 Booster Vaccination 10/30/2020   Moderna Sars-Covid-2 Vaccination 09/12/2019, 10/10/2019, 05/22/2020   PPD Test 09/08/2013   Pneumococcal-Unspecified 08/01/2007   Tdap 07/31/2011   Zoster, Live 11/21/2016    Tetanus: 2012, will boost PRN Pneumovax: 2009 Prevnar  20: Next visit Flu vaccine: Today Zostavax: 2018 Covid 19: 2/2, 2021, moderna + boosted last 03/2021  LMP: Post-Menopausal Pap: last 03/2020, Dr. Harrington Challenger MGM: 04/02/2020 by GYN, call to schedule  DEXA: 2019 ,has upcoming appointment with Dr. Cruzita Lederer Colonoscopy: Due 06/2022 per Dr. Fuller Plan  Last Dental Exam: 2022, encouraged to follow up Last Eye Exam:  2020, glasses   Patient Care Team: Unk Pinto, MD as PCP - General (Internal Medicine) Ladene Artist, MD as Consulting Physician (Gastroenterology) Fay Records, MD as Consulting Physician (Cardiology) Jessy Oto, MD as Consulting Physician (Orthopedic Surgery) Deneise Lever, MD  as Consulting Physician (Pulmonary Disease) Amil Amen, MD as Referring Physician (Neurology) Dorene Ar, MD (Pain Medicine)  Surgical History:  She has a past surgical history that includes abdominal wall tear; Mouth surgery; fractured right wrist; Wrist surgery (Right, 1973); and LEEP. Family History:  Herfamily history includes Alzheimer's disease in her father; Depression in her mother; Hyperlipidemia in her father; Hypertension in her father; Migraines in her brother, maternal grandmother, mother, and sister; Stroke in her mother. Social History:  She reports that she quit smoking about 27 years ago. Her smoking use included cigarettes. She started smoking about 47 years ago. She has a 19.00 pack-year smoking history. She has never used smokeless tobacco. She reports that she does not drink alcohol and does not use drugs.  Review of Systems: Review of Systems  Constitutional:  Negative for malaise/fatigue and weight loss.  HENT:  Negative for hearing loss and tinnitus.   Eyes:  Negative for blurred vision and double vision.  Respiratory:  Negative for cough, sputum production, shortness of breath and wheezing.   Cardiovascular:  Negative for chest pain, palpitations, orthopnea, claudication, leg swelling and PND.  Gastrointestinal:   Negative for abdominal pain, blood in stool, constipation, diarrhea, heartburn, melena, nausea and vomiting.  Genitourinary: Negative.   Musculoskeletal:  Positive for back pain. Negative for falls, joint pain and myalgias.  Skin:  Negative for rash.  Neurological:  Negative for dizziness, tingling, sensory change, weakness and headaches (none in over 6 months).  Endo/Heme/Allergies:  Negative for polydipsia.  Psychiatric/Behavioral:  Positive for memory loss (memory changes, poor short term memory). Negative for depression, substance abuse and suicidal ideas. The patient is not nervous/anxious and does not have insomnia.   All other systems reviewed and are negative.  Physical Exam: Estimated body mass index is 17.12 kg/m as calculated from the following:   Height as of this encounter: 5\' 1"  (1.549 m).   Weight as of this encounter: 90 lb 9.6 oz (41.1 kg). BP 126/90    Pulse 82    Temp (!) 97.5 F (36.4 C)    Ht 5\' 1"  (1.549 m)    Wt 90 lb 9.6 oz (41.1 kg)    SpO2 99%    BMI 17.12 kg/m  General Appearance: Well nourished, in no apparent distress.  Eyes: PERRLA, EOMs, conjunctiva no swelling or erythema Sinuses: No Frontal/maxillary tenderness  ENT/Mouth: Ext aud canals clear, normal light reflex with TMs without erythema, bulging. Good dentition. No erythema, swelling, or exudate on post pharynx. Tonsils not swollen or erythematous. Hearing normal.   Neck: Supple, thyroid normal. No bruits  Respiratory: Respiratory effort normal, BS equal bilaterally without rales, rhonchi, wheezing or stridor.  Cardio: RRR without murmurs, rubs or gallops. Brisk peripheral pulses without edema.  Chest: symmetric, with normal excursions and percussion.  Breasts: defer to GYN Abdomen: Soft, nontender, no guarding, rebound, hernias, masses, or organomegaly.  Lymphatics: Non tender without lymphadenopathy.  Genitourinary: defer to GYN Musculoskeletal: Full ROM all peripheral extremities,5/5 strength, and  normal gait. Pain with palpation to cervical and lumbar spine. Skin: Warm, dry without rashes, lesions, ecchymosis. Neuro: Cranial nerves intact, reflexes equal bilaterally. Normal muscle tone, no cerebellar symptoms. Sensation intact. Poor short term memory, poor attention, MMSE 24/30  Psych: Awake and oriented X 3, normal affect, Insight and Judgment appropriate.   MMSE - Mini Mental State Exam 08/05/2021 10/27/2020  Orientation to time 4 5  Orientation to Place 5 5  Registration 3 3  Attention/ Calculation 2 2  Recall  0 1  Language- name 2 objects 2 2  Language- repeat 1 1  Language- follow 3 step command 3 3  Language- read & follow direction 1 1  Write a sentence 1 1  Copy design 1 1  Total score 23 25    EKG: Sinus rhythm, IRBBB     Izora Ribas, NP-C 1:10 PM [facility]

## 2021-08-05 NOTE — Patient Instructions (Addendum)
Isabel [name] Isabel [name] , Thank you for taking time to come for your Annual Wellness Visit. I appreciate your ongoing commitment to your health goals. Please review the following plan we discussed and let me know if I can assist you in the future.   These are the goals we discussed:  Goals      DIET - INCREASE WATER INTAKE     Increase fluids working up to 55-65+ fluid ounces daily.      Exercise 150 min/wk Moderate Activity     15-20 min walking daily      LDL CALC < 100        This is a list of the screening recommended for you and due dates:  Health Maintenance  Topic Date Due   Flu Shot  02/28/2021   Pneumonia Vaccine (1 - PCV) 10/03/2021*   Zoster (Shingles) Vaccine (1 of 2) 11/03/2021*   Tetanus Vaccine  08/05/2022*   Mammogram  04/02/2022   Colon Cancer Screening  07/03/2022   DEXA scan (bone density measurement)  Completed   COVID-19 Vaccine  Completed   Hepatitis C Screening: USPSTF Recommendation to screen - Ages 44-79 yo.  Completed   HPV Vaccine  Aged Out  *Topic was postponed. The date shown is not the original due date.       Please schedule follow up with Dr. Harrington Challenger (GYN)  Please schedule eye exam     Know what a healthy weight is for you (roughly BMI <25) and aim to maintain this  Aim for 7+ servings of fruits and vegetables daily  65-80+ fluid ounces of water or unsweet tea for healthy kidneys  Limit to max 1 drink of alcohol per day; avoid smoking/tobacco  Limit animal fats in diet for cholesterol and heart health - choose grass fed whenever available  Avoid highly processed foods, and foods high in saturated/trans fats  Aim for low stress - take time to unwind and care for your mental health  Aim for 150 min of moderate intensity exercise weekly for heart health, and weights twice weekly for bone health  Aim for 7-9 hours of sleep daily     Management of Memory Problems  There are some general things you can do to help manage your memory  problems.  Your memory may not in fact recover, but by using techniques and strategies you will be able to manage your memory difficulties better.  1)  Establish a routine. Try to establish and then stick to a regular routine.  By doing this, you will get used to what to expect and you will reduce the need to rely on your memory.  Also, try to do things at the same time of day, such as taking your medication or checking your calendar first thing in the morning. Think about think that you can do as a part of a regular routine and make a list.  Then enter them into a daily planner to remind you.  This will help you establish a routine.  2)  Organize your environment. Organize your environment so that it is uncluttered.  Decrease visual stimulation.  Place everyday items such as keys or cell phone in the same place every day (ie.  Basket next to front door) Use post it notes with a brief message to yourself (ie. Turn off light, lock the door) Use labels to indicate where things go (ie. Which cupboards are for food, dishes, etc.) Keep a notepad and pen by the telephone to take  messages  3)  Memory Aids A diary or journal/notebook/daily planner Making a list (shopping list, chore list, to do list that needs to be done) Using an alarm as a reminder (kitchen timer or cell phone alarm) Using cell phone to store information (Notes, Calendar, Reminders) Calendar/White board placed in a prominent position Post-it notes  In order for memory aids to be useful, you need to have good habits.  It's no good remembering to make a note in your journal if you don't remember to look in it.  Try setting aside a certain time of day to look in journal.  4)  Improving mood and managing fatigue. There may be other factors that contribute to memory difficulties.  Factors, such as anxiety, depression and tiredness can affect memory. Regular gentle exercise can help improve your mood and give you more energy. Simple  relaxation techniques may help relieve symptoms of anxiety Try to get back to completing activities or hobbies you enjoyed doing in the past. Learn to pace yourself through activities to decrease fatigue. Find out about some local support groups where you can share experiences with others. Try and achieve 7-8 hours of sleep at night.

## 2021-08-06 LAB — URINALYSIS, ROUTINE W REFLEX MICROSCOPIC
Bilirubin Urine: NEGATIVE
Glucose, UA: NEGATIVE
Hgb urine dipstick: NEGATIVE
Ketones, ur: NEGATIVE
Leukocytes,Ua: NEGATIVE
Nitrite: NEGATIVE
Protein, ur: NEGATIVE
Specific Gravity, Urine: 1.009 (ref 1.001–1.035)
pH: 8 (ref 5.0–8.0)

## 2021-08-06 LAB — CBC WITH DIFFERENTIAL/PLATELET
Absolute Monocytes: 691 cells/uL (ref 200–950)
Basophils Absolute: 29 cells/uL (ref 0–200)
Basophils Relative: 0.4 %
Eosinophils Absolute: 58 cells/uL (ref 15–500)
Eosinophils Relative: 0.8 %
HCT: 44.7 % (ref 35.0–45.0)
Hemoglobin: 15 g/dL (ref 11.7–15.5)
Lymphs Abs: 1735 cells/uL (ref 850–3900)
MCH: 30.3 pg (ref 27.0–33.0)
MCHC: 33.6 g/dL (ref 32.0–36.0)
MCV: 90.3 fL (ref 80.0–100.0)
MPV: 10.2 fL (ref 7.5–12.5)
Monocytes Relative: 9.6 %
Neutro Abs: 4687 cells/uL (ref 1500–7800)
Neutrophils Relative %: 65.1 %
Platelets: 253 10*3/uL (ref 140–400)
RBC: 4.95 10*6/uL (ref 3.80–5.10)
RDW: 13 % (ref 11.0–15.0)
Total Lymphocyte: 24.1 %
WBC: 7.2 10*3/uL (ref 3.8–10.8)

## 2021-08-06 LAB — COMPLETE METABOLIC PANEL WITH GFR
AG Ratio: 2 (calc) (ref 1.0–2.5)
ALT: 15 U/L (ref 6–29)
AST: 25 U/L (ref 10–35)
Albumin: 4.7 g/dL (ref 3.6–5.1)
Alkaline phosphatase (APISO): 23 U/L — ABNORMAL LOW (ref 37–153)
BUN: 12 mg/dL (ref 7–25)
CO2: 31 mmol/L (ref 20–32)
Calcium: 9.8 mg/dL (ref 8.6–10.4)
Chloride: 102 mmol/L (ref 98–110)
Creat: 0.91 mg/dL (ref 0.50–1.05)
Globulin: 2.3 g/dL (calc) (ref 1.9–3.7)
Glucose, Bld: 90 mg/dL (ref 65–99)
Potassium: 3.7 mmol/L (ref 3.5–5.3)
Sodium: 142 mmol/L (ref 135–146)
Total Bilirubin: 0.5 mg/dL (ref 0.2–1.2)
Total Protein: 7 g/dL (ref 6.1–8.1)
eGFR: 70 mL/min/{1.73_m2} (ref >=60)

## 2021-08-06 LAB — MAGNESIUM: Magnesium: 2.3 mg/dL (ref 1.5–2.5)

## 2021-08-06 LAB — LIPID PANEL
Cholesterol: 179 mg/dL (ref <200)
HDL: 78 mg/dL (ref >=50)
LDL Cholesterol (Calc): 84 mg/dL (calc)
Non-HDL Cholesterol (Calc): 101 mg/dL (calc) (ref <130)
Total CHOL/HDL Ratio: 2.3 (calc) (ref <5.0)
Triglycerides: 78 mg/dL (ref <150)

## 2021-08-06 LAB — TSH: TSH: 2.76 mIU/L (ref 0.40–4.50)

## 2021-08-06 LAB — HEMOGLOBIN A1C
Hgb A1c MFr Bld: 5.6 % of total Hgb (ref <5.7)
Mean Plasma Glucose: 114 mg/dL
eAG (mmol/L): 6.3 mmol/L

## 2021-08-06 LAB — VITAMIN B12: Vitamin B-12: 994 pg/mL (ref 200–1100)

## 2021-08-06 LAB — MICROALBUMIN / CREATININE URINE RATIO
Creatinine, Urine: 39 mg/dL (ref 20–275)
Microalb Creat Ratio: 67 mcg/mg creat — ABNORMAL HIGH (ref <30)
Microalb, Ur: 2.6 mg/dL

## 2021-08-06 LAB — IRON,TIBC AND FERRITIN PANEL
%SAT: 24 % (calc) (ref 16–45)
Ferritin: 24 ng/mL (ref 16–288)
Iron: 89 ug/dL (ref 45–160)
TIBC: 375 mcg/dL (calc) (ref 250–450)

## 2021-08-06 LAB — VITAMIN D 25 HYDROXY (VIT D DEFICIENCY, FRACTURES): Vit D, 25-Hydroxy: 55 ng/mL (ref 30–100)

## 2021-08-16 DIAGNOSIS — M7918 Myalgia, other site: Secondary | ICD-10-CM | POA: Diagnosis not present

## 2021-08-22 ENCOUNTER — Ambulatory Visit: Payer: Medicare HMO | Admitting: Neurology

## 2021-08-22 ENCOUNTER — Encounter: Payer: Self-pay | Admitting: Neurology

## 2021-08-22 VITALS — BP 154/78 | HR 80 | Ht 61.0 in | Wt 89.0 lb

## 2021-08-22 DIAGNOSIS — R69 Illness, unspecified: Secondary | ICD-10-CM | POA: Diagnosis not present

## 2021-08-22 DIAGNOSIS — F0390 Unspecified dementia without behavioral disturbance: Secondary | ICD-10-CM | POA: Diagnosis not present

## 2021-08-22 MED ORDER — DONEPEZIL HCL 10 MG PO TABS
10.0000 mg | ORAL_TABLET | Freq: Every day | ORAL | 4 refills | Status: DC
Start: 2021-08-22 — End: 2022-10-23

## 2021-08-22 MED ORDER — MEMANTINE HCL 10 MG PO TABS: 10.0000 mg | ORAL_TABLET | Freq: Two times a day (BID) | ORAL | 4 refills | Status: DC
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
## 2021-09-20 ENCOUNTER — Other Ambulatory Visit: Payer: Self-pay | Admitting: Adult Health

## 2021-09-23 DIAGNOSIS — G894 Chronic pain syndrome: Secondary | ICD-10-CM | POA: Diagnosis not present

## 2021-09-23 DIAGNOSIS — M47816 Spondylosis without myelopathy or radiculopathy, lumbar region: Secondary | ICD-10-CM | POA: Diagnosis not present

## 2021-09-24 ENCOUNTER — Other Ambulatory Visit: Payer: Self-pay | Admitting: Adult Health

## 2021-09-24 DIAGNOSIS — I10 Essential (primary) hypertension: Secondary | ICD-10-CM

## 2021-09-24 DIAGNOSIS — G43709 Chronic migraine without aura, not intractable, without status migrainosus: Secondary | ICD-10-CM

## 2021-10-06 ENCOUNTER — Encounter: Payer: Self-pay | Admitting: Internal Medicine

## 2021-10-07 ENCOUNTER — Other Ambulatory Visit: Payer: Self-pay

## 2021-10-07 ENCOUNTER — Encounter: Payer: Self-pay | Admitting: Internal Medicine

## 2021-10-07 ENCOUNTER — Ambulatory Visit (INDEPENDENT_AMBULATORY_CARE_PROVIDER_SITE_OTHER): Payer: Medicare HMO | Admitting: Internal Medicine

## 2021-10-07 VITALS — BP 112/72 | HR 69 | Temp 97.9°F | Resp 16 | Ht 60.5 in | Wt 94.2 lb

## 2021-10-07 DIAGNOSIS — M545 Low back pain, unspecified: Secondary | ICD-10-CM

## 2021-10-07 DIAGNOSIS — G8929 Other chronic pain: Secondary | ICD-10-CM | POA: Diagnosis not present

## 2021-10-07 MED ORDER — GABAPENTIN 300 MG PO CAPS: ORAL_CAPSULE | ORAL | 0 refills | Status: DC
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
## 2021-10-19 ENCOUNTER — Ambulatory Visit: Payer: Medicare HMO | Admitting: Internal Medicine

## 2021-10-31 NOTE — Telephone Encounter (Signed)
Prolia VOB initiated via [email or web address] ? ?Last OV: 11/25/20 ?Next OV: 11/25/21 ?Last Prolia inj: 06/21/21 ?Next Prolia inj DUE: 12/20/21 ? ?

## 2021-11-03 DIAGNOSIS — M4726 Other spondylosis with radiculopathy, lumbar region: Secondary | ICD-10-CM | POA: Diagnosis not present

## 2021-11-03 DIAGNOSIS — M545 Low back pain, unspecified: Secondary | ICD-10-CM | POA: Diagnosis not present

## 2021-11-03 DIAGNOSIS — G8929 Other chronic pain: Secondary | ICD-10-CM | POA: Diagnosis not present

## 2021-11-04 ENCOUNTER — Other Ambulatory Visit: Payer: Self-pay | Admitting: Internal Medicine

## 2021-11-10 ENCOUNTER — Other Ambulatory Visit: Payer: Self-pay | Admitting: Internal Medicine

## 2021-11-10 ENCOUNTER — Encounter: Payer: Self-pay | Admitting: Internal Medicine

## 2021-11-10 DIAGNOSIS — N2889 Other specified disorders of kidney and ureter: Secondary | ICD-10-CM

## 2021-11-15 NOTE — Telephone Encounter (Signed)
Prior auth required for PROLIA  PA PROCESS DETAILS: Precertification is required. Call [phone] or complete the Precertification form available at [email or web address] 

## 2021-11-16 ENCOUNTER — Encounter: Payer: Self-pay | Admitting: Internal Medicine

## 2021-11-22 ENCOUNTER — Ambulatory Visit (HOSPITAL_COMMUNITY): Payer: Medicare HMO

## 2021-11-22 ENCOUNTER — Ambulatory Visit (HOSPITAL_COMMUNITY)
Admission: RE | Admit: 2021-11-22 | Discharge: 2021-11-22 | Disposition: A | Discharge status: Home / Self Care | Payer: Medicare HMO | Source: Ambulatory Visit | Attending: Internal Medicine | Admitting: Internal Medicine

## 2021-11-22 DIAGNOSIS — N200 Calculus of kidney: Secondary | ICD-10-CM | POA: Diagnosis not present

## 2021-11-22 DIAGNOSIS — N281 Cyst of kidney, acquired: Secondary | ICD-10-CM | POA: Diagnosis not present

## 2021-11-22 DIAGNOSIS — I700 Atherosclerosis of aorta: Secondary | ICD-10-CM | POA: Diagnosis not present

## 2021-11-22 DIAGNOSIS — K7689 Other specified diseases of liver: Secondary | ICD-10-CM | POA: Diagnosis not present

## 2021-11-22 DIAGNOSIS — N2889 Other specified disorders of kidney and ureter: Secondary | ICD-10-CM | POA: Diagnosis not present

## 2021-11-22 LAB — POCT I-STAT CREATININE: Creatinine, Ser: 1.3 mg/dL — ABNORMAL HIGH (ref 0.44–1.00)

## 2021-11-22 MED ORDER — IOHEXOL 300 MG/ML  SOLN
100.0000 mL | Freq: Once | INTRAMUSCULAR | Status: AC | PRN
  Administered 2021-11-22: 100 mL via INTRAVENOUS

## 2021-11-22 MED ORDER — SODIUM CHLORIDE (PF) 0.9 % IJ SOLN
INTRAMUSCULAR | Status: AC
  Filled 2021-11-22: qty 50

## 2021-11-23 ENCOUNTER — Other Ambulatory Visit: Payer: Self-pay | Admitting: Internal Medicine

## 2021-11-23 DIAGNOSIS — E039 Hypothyroidism, unspecified: Secondary | ICD-10-CM

## 2021-11-23 NOTE — Progress Notes (Signed)
<><><><><><><><><><><><><><><><><><><><><><><><><><><><><><><><><> ?<><><><><><><><><><><><><><><><><><><><><><><><><><><><><><><><><> ? ?-    Kidney CT scan - OK - Shows cysts NOT suspect for Cancer  - Great !  ? ? ?( Overall - all of internal organs  ?                                       - Liver , Pancreas, Spleen, Gallbladder, Kidneys look OK  ) ? ?<><><><><><><><><><><><><><><><><><><><><><><><><><><><><><><><><> ?<><><><><><><><><><><><><><><><><><><><><><><><><><><><><><><><><> ?

## 2021-11-24 DIAGNOSIS — H524 Presbyopia: Secondary | ICD-10-CM | POA: Diagnosis not present

## 2021-11-24 DIAGNOSIS — H52223 Regular astigmatism, bilateral: Secondary | ICD-10-CM | POA: Diagnosis not present

## 2021-11-24 DIAGNOSIS — H5203 Hypermetropia, bilateral: Secondary | ICD-10-CM | POA: Diagnosis not present

## 2021-11-25 ENCOUNTER — Ambulatory Visit: Payer: Medicare HMO | Admitting: Internal Medicine

## 2021-11-25 ENCOUNTER — Encounter: Payer: Self-pay | Admitting: Internal Medicine

## 2021-11-25 VITALS — BP 100/54 | HR 79 | Ht 60.5 in | Wt 91.4 lb

## 2021-11-25 DIAGNOSIS — R748 Abnormal levels of other serum enzymes: Secondary | ICD-10-CM

## 2021-11-25 DIAGNOSIS — E559 Vitamin D deficiency, unspecified: Secondary | ICD-10-CM

## 2021-11-25 DIAGNOSIS — M810 Age-related osteoporosis without current pathological fracture: Secondary | ICD-10-CM | POA: Diagnosis not present
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
## 2021-11-25 NOTE — Patient Instructions (Addendum)
Please continue vitamin D. ? ?Please call and schedule bone density scan at the [facility]: [phone]. ? ?Please come back for a follow-up appointment in 1 year. ? ?

## 2021-11-28 ENCOUNTER — Encounter: Payer: Self-pay | Admitting: Internal Medicine
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
## 2021-11-29 ENCOUNTER — Encounter: Payer: Self-pay | Admitting: Internal Medicine

## 2021-11-29 ENCOUNTER — Ambulatory Visit (INDEPENDENT_AMBULATORY_CARE_PROVIDER_SITE_OTHER): Payer: Medicare HMO | Admitting: Internal Medicine

## 2021-11-29 VITALS — BP 114/70 | HR 77 | Temp 97.9°F | Resp 16 | Ht 60.5 in | Wt 84.2 lb

## 2021-11-29 DIAGNOSIS — M545 Low back pain, unspecified: Secondary | ICD-10-CM

## 2021-11-29 DIAGNOSIS — G8929 Other chronic pain: Secondary | ICD-10-CM

## 2021-11-29 DIAGNOSIS — M47816 Spondylosis without myelopathy or radiculopathy, lumbar region: Secondary | ICD-10-CM | POA: Diagnosis not present

## 2021-11-29 MED ORDER — DEXAMETHASONE 4 MG PO TABS: ORAL_TABLET | ORAL | 0 refills | Status: DC

## 2021-11-29 NOTE — Patient Instructions (Signed)
Lumbar Strain ?A lumbar strain, which is sometimes called a low-back strain, is a stretch or tear in a muscle or the strong cords of tissue that attach muscle to bone (tendons) in the lower back (lumbar spine). This type of injury occurs when muscles or tendons are torn or are stretched beyond their limits. ?Lumbar strains can range from mild to severe. Mild strains may involve stretching a muscle or tendon without tearing it. These may heal in 1-2 weeks. More severe strains involve tearing of muscle fibers or tendons. These will cause more pain and may take 6-8 weeks to heal. ?What are the causes? ?This condition may be caused by: ?Trauma, such as a fall or a hit to the body. ?Twisting or overstretching the back. This may result from doing activities that need a lot of energy, such as lifting heavy objects. ?What increases the risk? ?This injury is more common in: ?Athletes. ?People with obesity. ?People who do repeated lifting, bending, or other movements that involve their back. ?What are the signs or symptoms? ?Symptoms of this condition may include: ?Sharp or dull pain in the lower back that does not go away. The pain may extend to the buttocks. ?Stiffness or limited range of motion. ?Sudden muscle tightening (spasms). ?How is this diagnosed? ?This condition may be diagnosed based on: ?Your symptoms. ?Your medical history. ?A physical exam. ?Imaging tests, such as: ?X-rays. ?MRI. ?How is this treated? ?Treatment for this condition may include: ?Rest. ?Applying heat and cold to the affected area. ?Over-the-counter medicines to help relieve pain and inflammation, such as NSAIDs. ?Prescription pain medicine and muscle relaxants may be needed for a short time. ?Physical therapy. ?Follow these instructions at home: ?Managing pain, stiffness, and swelling ? ? ?If directed, put ice on the injured area during the first 24 hours after your injury. ?Put ice in a plastic bag. ?Place a towel between your skin and the  bag. ?Leave the ice on for 20 minutes, 2-3 times a day. ?If directed, apply heat to the affected area as often as told by your health care provider. Use the heat source that your health care provider recommends, such as a moist heat pack or a heating pad. ?Place a towel between your skin and the heat source. ?Leave the heat on for 20-30 minutes. ?Remove the heat if your skin turns bright red. This is especially important if you are unable to feel pain, heat, or cold. You may have a greater risk of getting burned. ?Activity ?Rest and return to your normal activities as told by your health care provider. Ask your health care provider what activities are safe for you. ?Do exercises as told by your health care provider. ?Medicines ?Take over-the-counter and prescription medicines only as told by your health care provider. ?Ask your health care provider if the medicine prescribed to you: ?Requires you to avoid driving or using heavy machinery. ?Can cause constipation. You may need to take these actions to prevent or treat constipation: ?Drink enough fluid to keep your urine pale yellow. ?Take over-the-counter or prescription medicines. ?Eat foods that are high in fiber, such as beans, whole grains, and fresh fruits and vegetables. ?Limit foods that are high in fat and processed sugars, such as fried or sweet foods. ?Injury prevention ?To prevent a future low-back injury: ?Always warm up properly before physical activity or sports. ?Cool down and stretch after being active. ?Use correct form when playing sports and lifting heavy objects. Bend your knees before you lift heavy objects. ?Use  good posture when sitting and standing. ?Stay physically fit and keep a healthy weight. ?Do at least 150 minutes of moderate-intensity exercise each week, such as brisk walking or water aerobics. ?Do strength exercises at least 2 times each week. ? ?General instructions ?Do not use any products that contain nicotine or tobacco, such as  cigarettes, e-cigarettes, and chewing tobacco. If you need help quitting, ask your health care provider. ?Keep all follow-up visits as told by your health care provider. This is important. ?Contact a health care provider if: ?Your back pain does not improve after 6 weeks of treatment. ?Your symptoms get worse. ?Get help right away if: ?Your back pain is severe. ?You are unable to stand or walk. ?You develop pain in your legs. ?You develop weakness in your buttocks or legs. ?You have difficulty controlling when you urinate or when you have a bowel movement. ?You have frequent, painful, or bloody urination. ?You have a temperature over 101.0?F (38.3?C) ?Summary ?A lumbar strain, which is sometimes called a low-back strain, is a stretch or tear in a muscle or the strong cords of tissue that attach muscle to bone (tendons) in the lower back (lumbar spine). ?This type of injury occurs when muscles or tendons are torn or are stretched beyond their limits. ?Rest and return to your normal activities as told by your health care provider. If directed, apply heat and ice to the affected area as often as told by your health care provider. ?Take over-the-counter and prescription medicines only as told by your health care provider. ?Contact a health care provider if you have new or worsening symptoms. ?------------------------------------------ ?Low Back Sprain or Strain Rehab ?Ask your health care provider which exercises are safe for you. Do exercises exactly as told by your health care provider and adjust them as directed. It is normal to feel mild stretching, pulling, tightness, or discomfort as you do these exercises. Stop right away if you feel sudden pain or your pain gets worse. Do not begin these exercises until told by your health care provider. ?Stretching and range-of-motion exercises ?These exercises warm up your muscles and joints and improve the movement and flexibility of your back. These exercises also help to  relieve pain, numbness, and tingling. ?Lumbar rotation ? ?Lie on your back on a firm bed or the floor with your knees bent. ?Straighten your arms out to your sides so each arm forms a 90-degree angle (right angle) with a side of your body. ?Slowly move (rotate) both of your knees to one side of your body until you feel a stretch in your lower back (lumbar). Try not to let your shoulders lift off the floor. ?Hold this position for __________ seconds. ?Tense your abdominal muscles and slowly move your knees back to the starting position. ?Repeat this exercise on the other side of your body. ?Repeat __________ times. Complete this exercise __________ times a day. ?Single knee to chest ? ?Lie on your back on a firm bed or the floor with both legs straight. ?Bend one of your knees. Use your hands to move your knee up toward your chest until you feel a gentle stretch in your lower back and buttock. ?Hold your leg in this position by holding on to the front of your knee. ?Keep your other leg as straight as possible. ?Hold this position for __________ seconds. ?Slowly return to the starting position. ?Repeat with your other leg. ?Repeat __________ times. Complete this exercise __________ times a day. ?Prone extension on elbows ? ?Shanda Howells  on your abdomen on a firm bed or the floor (prone position). ?Prop yourself up on your elbows. ?Use your arms to help lift your chest up until you feel a gentle stretch in your abdomen and your lower back. ?This will place some of your body weight on your elbows. If this is uncomfortable, try stacking pillows under your chest. ?Your hips should stay down, against the surface that you are lying on. Keep your hip and back muscles relaxed. ?Hold this position for __________ seconds. ?Slowly relax your upper body and return to the starting position. ?Repeat __________ times. Complete this exercise __________ times a day. ?Strengthening exercises ?These exercises build strength and endurance in your  back. Endurance is the ability to use your muscles for a long time, even after they get tired. ?Pelvic tilt ?This exercise strengthens the muscles that lie deep in the abdomen. ?Lie on your back on a firm be

## 2021-12-08 NOTE — Telephone Encounter (Signed)
Prior Authorization initiated for [facility] via [employer] ?Case ID: [id number] ? ?APPROVED ? ? ? ?

## 2021-12-08 NOTE — Telephone Encounter (Signed)
Pt ready for scheduling on or after 12/20/21 ? ?Out-of-pocket cost due at time of visit: $301 ? ?Primary: [employer] Medicare ?[employer] co-insurance: 20% (approximately $276) ?Admin fee co-insurance: 20% (approximately $25) ? ?Secondary: n/a ?Prolia co-insurance:  ?Admin fee co-insurance:  ? ?Deductible: does not apply ? ?Prior Auth: APPROVED ?PA# [id number] ?Valid: 12/08/21-12/08/22 ? ?** This summary of benefits is an estimation of the patient's out-of-pocket cost. Exact cost may vary based on individual plan coverage.  ? ?
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
## 2021-12-14 ENCOUNTER — Encounter: Payer: Self-pay | Admitting: Physical Therapy

## 2021-12-14 ENCOUNTER — Ambulatory Visit (INDEPENDENT_AMBULATORY_CARE_PROVIDER_SITE_OTHER): Payer: Medicare HMO | Admitting: Adult Health

## 2021-12-14 ENCOUNTER — Other Ambulatory Visit: Payer: Self-pay

## 2021-12-14 ENCOUNTER — Ambulatory Visit: Payer: Medicare HMO | Attending: Internal Medicine | Admitting: Physical Therapy

## 2021-12-14 ENCOUNTER — Encounter: Payer: Self-pay | Admitting: Adult Health

## 2021-12-14 ENCOUNTER — Encounter: Payer: Self-pay | Admitting: Internal Medicine

## 2021-12-14 VITALS — BP 110/78 | HR 66 | Temp 97.7°F | Wt 92.4 lb

## 2021-12-14 DIAGNOSIS — G43809 Other migraine, not intractable, without status migrainosus: Secondary | ICD-10-CM | POA: Diagnosis not present

## 2021-12-14 DIAGNOSIS — E782 Mixed hyperlipidemia: Secondary | ICD-10-CM | POA: Diagnosis not present

## 2021-12-14 DIAGNOSIS — R809 Proteinuria, unspecified: Secondary | ICD-10-CM | POA: Diagnosis not present

## 2021-12-14 DIAGNOSIS — I10 Essential (primary) hypertension: Secondary | ICD-10-CM | POA: Diagnosis not present

## 2021-12-14 DIAGNOSIS — K219 Gastro-esophageal reflux disease without esophagitis: Secondary | ICD-10-CM

## 2021-12-14 DIAGNOSIS — R6889 Other general symptoms and signs: Secondary | ICD-10-CM | POA: Diagnosis not present

## 2021-12-14 DIAGNOSIS — G4700 Insomnia, unspecified: Secondary | ICD-10-CM

## 2021-12-14 DIAGNOSIS — M5459 Other low back pain: Secondary | ICD-10-CM | POA: Diagnosis not present

## 2021-12-14 DIAGNOSIS — M47816 Spondylosis without myelopathy or radiculopathy, lumbar region: Secondary | ICD-10-CM | POA: Insufficient documentation

## 2021-12-14 DIAGNOSIS — Z79899 Other long term (current) drug therapy: Secondary | ICD-10-CM

## 2021-12-14 DIAGNOSIS — R252 Cramp and spasm: Secondary | ICD-10-CM

## 2021-12-14 DIAGNOSIS — Z23 Encounter for immunization: Secondary | ICD-10-CM

## 2021-12-14 DIAGNOSIS — E039 Hypothyroidism, unspecified: Secondary | ICD-10-CM

## 2021-12-14 DIAGNOSIS — M545 Low back pain, unspecified: Secondary | ICD-10-CM | POA: Insufficient documentation

## 2021-12-14 DIAGNOSIS — Z0100 Encounter for examination of eyes and vision without abnormal findings: Secondary | ICD-10-CM | POA: Diagnosis not present

## 2021-12-14 DIAGNOSIS — R4189 Other symptoms and signs involving cognitive functions and awareness: Secondary | ICD-10-CM

## 2021-12-14 DIAGNOSIS — Z681 Body mass index (BMI) 19 or less, adult: Secondary | ICD-10-CM

## 2021-12-14 DIAGNOSIS — R69 Illness, unspecified: Secondary | ICD-10-CM | POA: Diagnosis not present

## 2021-12-14 DIAGNOSIS — G8929 Other chronic pain: Secondary | ICD-10-CM | POA: Diagnosis not present

## 2021-12-14 DIAGNOSIS — E559 Vitamin D deficiency, unspecified: Secondary | ICD-10-CM

## 2021-12-14 DIAGNOSIS — Z0001 Encounter for general adult medical examination with abnormal findings: Secondary | ICD-10-CM | POA: Diagnosis not present

## 2021-12-14 DIAGNOSIS — M47812 Spondylosis without myelopathy or radiculopathy, cervical region: Secondary | ICD-10-CM | POA: Diagnosis not present

## 2021-12-14 DIAGNOSIS — M810 Age-related osteoporosis without current pathological fracture: Secondary | ICD-10-CM | POA: Diagnosis not present

## 2021-12-14 DIAGNOSIS — F0390 Unspecified dementia without behavioral disturbance: Secondary | ICD-10-CM

## 2021-12-14 DIAGNOSIS — N182 Chronic kidney disease, stage 2 (mild): Secondary | ICD-10-CM

## 2021-12-14 DIAGNOSIS — Z0000 Encounter for general adult medical examination without abnormal findings: Secondary | ICD-10-CM

## 2021-12-14 NOTE — Progress Notes (Signed)
ANNUAL WELLNESS VISIT AND FOLLOW UP ? ? ?Assessment and Plan: ? ?Annual Medicare Wellness Visit ?Due annually  ?Health maintenance reviewed ?- schedule mammogram with GYN Dr. Harrington Challenger ? ?Other osteoporosis without current pathological fracture ?She is receiving Prolia Q21month via Dr. GCruzita Ledererand is taking Ca++ and Vit D.  ?Has follow up DEXA pending  ? ?Hypothyroidism, unspecified type ?continue medications the same pending lab results ?reminded to take on an empty stomach 30-619ms before food.  ?-     TSH ? ?Mixed hyperlipidemia ?Taking Crestor, fish oil ?LDL goal <100 ?decrease fatty foods ?increase activity.  ?-     Lipid panel ? ?Other migraine without status migrainosus, not intractable ?Recently improved; remains off of aimovig and remains controlled ?Has Zolitriptan PRN ? ?Hx of Prediabetes ?Recent A1Cs at goal ?Discussed diet/exercise, weight management  ?Defer A1C; check CMP ? ?Vitamin D deficiency ?Taking daily supplimentation, continue ? ?Gastroesophageal reflux disease, esophagitis presence not specified ?Continue PPI/H2i; do food log to identify triggers ?Discussed diet, avoiding triggers and other lifestyle changes ? ?Hypertension ?- continue medications, DASH diet, exercise and monitor at home. Call if greater than 130/80.  ?Patient to monitor at home and contact office if elevation is consistent ?. Marland KitchenRBBB on EKG ?IRBBB, monitor  ? ?Medication management ?       -     Magnesium  ?-     TSH ?-     COMPLETE METABOLIC PANEL WITH GFR ?-     Urinalysis w microscopic + reflex cultur ? ?BMI <19 ?Weight gain encouraged; boost, ensure, high protein calorie diet reviewed and recommended ?Improving with low dose remeron 7.5 mg daily, continue for now  ?Aim to get back up to baseline 100 lb ?Check CMP/GFR ? ?[place]?Poor short term memory and attention/calculation ?Dr. YaKrista Blueollowing ?Continue aricept/namenda ?Does have chronic microvascular changes - vascular health, exercise,  mind diet, memory exercises reviewed ?Monitor ? ?Microalbuminuria ?- repeat microalbumin/creatinine ratio  ? ?Need for pneumonia vaccination ?- 20 valent pneumococcal vaccine administered without complication today  ? ? ?Orders Placed This Encounter  ?Procedures  ? CBC with Differential/Platelet  ? COMPLETE METABOLIC PANEL WITH GFR  ? Magnesium  ? Lipid panel  ? TSH  ? Microalbumin / creatinine urine ratio  ? ? ? ?Discussed med's effects and SE's. Screening labs and tests as requested with regular follow-up as recommended. ?Over 40 minutes of exam, counseling, chart review, and complex, high level critical decision making was performed this visit.  ? ?Future Appointments  ?Date Time Provider [place]?12/21/2021 11:00 AM Riddles, JuCorky DownsPT [facility]  ?01/02/2022  1:15 PM WhRennie NatterPT [facility]  ?01/05/2022  1:15 PM WhRennie NatterPT [facility]  ?01/09/2022  1:15 PM WhRennie NatterPT [facility]  ?01/12/2022 11:00 AM WhRennie NatterPT [facility]  ?01/16/2022  1:15 PM WhRennie NatterPT [facility]  ?01/19/2022  1:15 PM ClArtist PaisPTA [facility]  ?01/23/2022  1:15 PM ClArtist PaisPTA [facility]  ?01/25/2022  1:15 PM WhRennie NatterPT [facility]  ?08/08/2022 10:00 AM CoLiane ComberNP [facility] None  ?08/23/2022  2:00 PM MiWard GivensNP [facility] None  ?11/28/2022  2:00 PM GhPhilemon KingdomMD [facility] None  ?12/18/2022  2:00 PM Mull, DaTownsend RogerNP [facility] None  ? ? ?Plan:  ? ?During the course of the visit the patient was educated and counseled about appropriate screening and preventive services including:  ? ?  Pneumococcal vaccine  ?Prevnar 13 ?Influenza vaccine ?Td vaccine ?Screening electrocardiogram ?Bone densitometry screening ?Colorectal cancer screening ?Diabetes screening ?Glaucoma screening ?Nutrition counseling  ?Advanced directives: requested ? ? ?HPI  ?66 y.o. female  presents for AWV and follow up for has  Hypertension; Hyperlipidemia; GERD (gastroesophageal reflux disease); Cervical spondylosis without myelopathy; Headache, migraine; Vitamin D deficiency; Medication management; Age-related osteoporosis without current pathological fracture; Hypothyroidism; Insomnia; Right bundle branch block (RBBB) on electrocardiogram (ECG); Adult BMI <19 kg/sq m; CKD (chronic kidney disease) stage 2, GFR 60-89 ml/min; Cognitive impairment; and Dementia without behavioral disturbance, psychotic disturbance, mood disturbance, or anxiety ([place]) on their problem list.  ? ?She has had migraines since she was 66 y/o; she is on amlodipine 5 mg and aimovig injection for many years but was able to taper off recently. She has zolmitriptan as abortive which works well for her but last use was remote.  ? ?Significant memory changes in recent years, persistent poor short term recall over the pandemic and recently referred to est with new neurologist [name], had MRI showing advanced for age microvascular changes, and mild generalized atrophy, felt most consistent with central nervous system degenerative disorder and has been initiated on aricept/nemenda, patient notes memory is stable/improved in the last 6 months. Husband helps to monitor/manage meds.  ? ?She is off of valium, doing well with trazodone 75 mg daily for sleep.  ? ?She is seeing a new provider at [facility] for chronic back pain, getting injections/ablations which have been helpful, but doesn't last very long. Just started today with PT.  ? ?she has a diagnosis of GERD which is currently managed by lifestyle, rare breakthrough, takes tums occasionally.  ? ?BMI is Body mass index is 17.75 kg/m?., she has been working on diet and exercise, and walking more with her though limited by lower back pain. Just started PT. Her weight was down to 89 lb, started on low dose remeron 7.5 mg with good perceived results, weight up 3 lb.  ?Wt Readings from Last 3 Encounters:  ?12/14/21 92 lb 6.4 oz  (41.9 kg)  ?11/29/21 84 lb 3.2 oz (38.2 kg)  ?11/25/21 91 lb 6.4 oz (41.5 kg)  ? ?Her blood pressure has been controlled at home, today their BP is BP: 110/78 ?She does workout in the form of walking. She denies chest pain, shortness of breath, dizziness.  ? ?She is on cholesterol medication (rosuvastatin 20 mg daily)  and denies myalgias. Her cholesterol is not at goal. The cholesterol last visit was:   ?Lab Results  ?Component Value Date  ? CHOL 179 08/05/2021  ? HDL 78 08/05/2021  ? [place] 84 08/05/2021  ? TRIG 78 08/05/2021  ? CHOLHDL 2.3 08/05/2021  ? ?She has been working on diet and exercise for hx of prediabetes, she is not on bASA, she is not on ACE/ARB and denies nausea, polydipsia and polyuria. Last A1C in the office was:  ?Lab Results  ?Component Value Date  ? HGBA1C 5.6 08/05/2021  ? ?She is on thyroid medication. Her medication was not changed last visit.  Taking 25 mcg daily.  ?Lab Results  ?Component Value Date  ? TSH 2.76 08/05/2021  ? ?Last GFR: ?Lab Results  ?Component Value Date  ? GFRNONAA 67 02/02/2021  ? ?Patient is on Vitamin D supplement, now taking 1000 mcg, was recommende dreduced dose by Dr. Cruzita Lederer. She manages osteoporosis, getting prolia injections. Last DEXA was 2019, Dr. Darnell Level has ordered and planning follow up, DEXA pending in system.  ?  Lab Results  ?Component Value Date  ? VD25OH 55 08/05/2021  ?   ? ? ?Current Medications:  ?Current Outpatient Medications on File Prior to Visit  ?Medication Sig Dispense Refill  ? amLODipine (NORVASC) 5 MG tablet TAKE ONE TABLET BY MOUTH ONE TIME DAILY 90 tablet 3  ? Cholecalciferol 25 MCG (1000 UT) capsule Take 1 capsule (1,000 Units total) by mouth daily.    ? denosumab (PROLIA) 60 MG/ML SOSY injection Inject 60 mg into the skin every 6 (six) months.    ? donepezil (ARICEPT) 10 MG tablet Take 1 tablet (10 mg total) by mouth at bedtime. 90 tablet 4  ? levothyroxine (SYNTHROID) 25 MCG tablet TAKE ONE TABLET BY MOUTH ONE TIME DAILY ON AN EMPTY STOMACH  WITH ONLY WATER FOR 30 MINUTES AND NO ANTACID MEDS, CALCIUM OR MAGNESIUM FOR 4 HOURS AND AVOID BIOTIN. 90 tablet 3  ? memantine (NAMENDA) 10 MG tablet Take 1 tablet (10 mg total) by mouth 2 (two) times daily

## 2021-12-14 NOTE — Patient Instructions (Signed)
? ? ? ?  Please call Dr. Harrington Challenger (GYN) for follow up - due for mammogram  ?Please coordinate with Dr. Arman Filter office about her recommendation for scheudling the bone density exam - order is in the system  ?

## 2021-12-15 ENCOUNTER — Encounter: Payer: Self-pay | Admitting: Adult Health

## 2021-12-15 ENCOUNTER — Other Ambulatory Visit: Payer: Self-pay | Admitting: Adult Health

## 2021-12-15 DIAGNOSIS — R809 Proteinuria, unspecified: Secondary | ICD-10-CM | POA: Insufficient documentation

## 2021-12-15 LAB — LIPID PANEL
Cholesterol: 174 mg/dL (ref <200)
HDL: 65 mg/dL (ref >=50)
LDL Cholesterol (Calc): 83 mg/dL (calc)
Non-HDL Cholesterol (Calc): 109 mg/dL (calc) (ref <130)
Total CHOL/HDL Ratio: 2.7 (calc) (ref <5.0)
Triglycerides: 159 mg/dL — ABNORMAL HIGH (ref <150)

## 2021-12-15 LAB — MICROALBUMIN / CREATININE URINE RATIO
Creatinine, Urine: 63 mg/dL (ref 20–275)
Microalb Creat Ratio: 33 mcg/mg creat — ABNORMAL HIGH (ref <30)
Microalb, Ur: 2.1 mg/dL

## 2021-12-15 LAB — TSH: TSH: 2.05 mIU/L (ref 0.40–4.50)

## 2021-12-15 LAB — COMPLETE METABOLIC PANEL WITH GFR
AG Ratio: 2.2 (calc) (ref 1.0–2.5)
ALT: 22 U/L (ref 6–29)
AST: 24 U/L (ref 10–35)
Albumin: 4.4 g/dL (ref 3.6–5.1)
Alkaline phosphatase (APISO): 21 U/L — ABNORMAL LOW (ref 37–153)
BUN: 16 mg/dL (ref 7–25)
CO2: 29 mmol/L (ref 20–32)
Calcium: 9.3 mg/dL (ref 8.6–10.4)
Chloride: 103 mmol/L (ref 98–110)
Creat: 0.99 mg/dL (ref 0.50–1.05)
Globulin: 2 g/dL (calc) (ref 1.9–3.7)
Glucose, Bld: 107 mg/dL — ABNORMAL HIGH (ref 65–99)
Potassium: 3.5 mmol/L (ref 3.5–5.3)
Sodium: 142 mmol/L (ref 135–146)
Total Bilirubin: 0.4 mg/dL (ref 0.2–1.2)
Total Protein: 6.4 g/dL (ref 6.1–8.1)
eGFR: 62 mL/min/{1.73_m2} (ref >=60)

## 2021-12-15 LAB — CBC WITH DIFFERENTIAL/PLATELET
Absolute Monocytes: 754 cells/uL (ref 200–950)
Basophils Absolute: 35 cells/uL (ref 0–200)
Basophils Relative: 0.3 %
Eosinophils Absolute: 93 cells/uL (ref 15–500)
Eosinophils Relative: 0.8 %
HCT: 40.4 % (ref 35.0–45.0)
Hemoglobin: 13.4 g/dL (ref 11.7–15.5)
Lymphs Abs: 2042 cells/uL (ref 850–3900)
MCH: 30.5 pg (ref 27.0–33.0)
MCHC: 33.2 g/dL (ref 32.0–36.0)
MCV: 91.8 fL (ref 80.0–100.0)
MPV: 10 fL (ref 7.5–12.5)
Monocytes Relative: 6.5 %
Neutro Abs: 8677 cells/uL — ABNORMAL HIGH (ref 1500–7800)
Neutrophils Relative %: 74.8 %
Platelets: 258 10*3/uL (ref 140–400)
RBC: 4.4 10*6/uL (ref 3.80–5.10)
RDW: 12.6 % (ref 11.0–15.0)
Total Lymphocyte: 17.6 %
WBC: 11.6 10*3/uL — ABNORMAL HIGH (ref 3.8–10.8)

## 2021-12-15 LAB — MAGNESIUM: Magnesium: 2 mg/dL (ref 1.5–2.5)

## 2021-12-20 NOTE — Therapy (Signed)
OUTPATIENT PHYSICAL THERAPY TREATMENT NOTE   Patient Name: Isabel [name] Work [name] MRN: [id number] DOB:[birth year], 66 y.o., female Today's Date: 12/21/2021   PT End of Session - 12/21/21 1101     Visit Number 2    Date for PT Re-Evaluation 01/25/22    Authorization Type AETNA MCR    Progress Note Due on Visit 10    PT Start Time 1101    PT Stop Time 1145    PT Time Calculation (min) 44 min    Activity Tolerance Patient tolerated treatment well    Behavior During Therapy [facility] for tasks assessed/performed           Rationale for Evaluation and Treatment Rehabilitation    Past Medical History:  Diagnosis Date   Carpal tunnel syndrome on right    History of concussion    1972, 1977, 1983   Hyperlipidemia    Hypertension    Migraines    pain clinic [place]   Osteoporosis    Past Surgical History:  Procedure Laterality Date   abdominal wall tear     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   fractured right wrist     LEEP     early 20's   [place] Right 1973   Patient Active Problem List   Diagnosis Date Noted   Microalbuminuria 12/15/2021   Dementia without behavioral disturbance, psychotic disturbance, mood disturbance, or anxiety ([place]) 08/22/2021   Cognitive impairment 05/04/2021   CKD (chronic kidney disease) stage 2, GFR 60-89 ml/min 07/22/2020   Adult BMI <19 kg/sq m 07/21/2020   Right bundle branch block (RBBB) on electrocardiogram (ECG) 07/21/2019   Insomnia 10/02/2018   Hypothyroidism 05/21/2017   Age-related osteoporosis without current pathological fracture 11/24/2016   Vitamin D deficiency 09/02/2014   Medication management 09/02/2014   GERD (gastroesophageal reflux disease) 01/18/2014   Hypertension    Hyperlipidemia    Cervical spondylosis without myelopathy 11/21/2011   Headache, migraine 11/21/2011    PCP: Unk Pinto, MD   REFERRING PROVIDER: Unk Pinto, MD   REFERRING DIAG:  [phone] (ICD-10-CM) -  Osteoarthritis of lumbar spine, unspecified spinal osteoarthritis complication status  R41.63,A45.36 (ICD-10-CM) - Chronic bilateral low back pain without sciatica    THERAPY DIAG:  Other low back pain  Cramp and spasm  ONSET DATE: 10/29/21  SUBJECTIVE:                                                                                                                                                                                           SUBJECTIVE STATEMENT: My back is hurting.   PERTINENT HISTORY:  Dementia, Osteoporosis, migraines, HTN, CTS R  PAIN:  Are you having pain?  Yes: NPRS scale: 5/10 Pain location: Bilateral LB Pain description: nerve pain Aggravating factors: bending/gardening Relieving factors: heat   PRECAUTIONS: None  WEIGHT BEARING RESTRICTIONS No  FALLS:  Has patient fallen in last 6 months? No  LIVING ENVIRONMENT: Lives with: lives with their spouse Lives in: House/apartment Stairs: Yes: Internal: 12 steps; on right going up; 2 external steps  Has following equipment at home: None  OCCUPATION: retired; likes gardening  PLOF: Independent  PATIENT GOALS Less pain with ADLS   OBJECTIVE:   DIAGNOSTIC FINDINGS:  None recent  PATIENT SURVEYS:  FOTO 58 (predicted 66)  MUSCLE LENGTH: Tight R HS, L piriformis  PALPATION: Left gluteals and pf; Right gluteals CPA and R UPA pain at L2/3 and L3/4  LUMBAR ROM:   Active  A/PROM  12/14/2021  Flexion Full  Extension Full  Right lateral flexion Full  Left lateral flexion Full  Right rotation Mild tightness  Left rotation Mild tightness   (Blank rows = not tested)  LE ROM: WNL   LE MMT:  MMT Right 12/14/2021 Left 12/14/2021  Hip flexion 4 4-  Hip extension 4 4-  Hip abduction 4+ 5  Hip adduction    Knee flexion 4- 4-  Knee extension 5 5  Ankle dorsiflexion 5 5   (Blank rows = not tested)    TODAY'S TREATMENT  12/21/21:  Nustep L5 x 6 min  Supine HS stretch with strap 2x30 sec  Bil (some nerve tension on L if goes too far) Sciatic nerve glide L 5 sec on/off x 10 with ankle DF LTR 10 sec hold x 5 each way Piriformis stretch supine 2x 30 sec Bil Bridge with GTB 5 sec x 10 Standing hip GTB ABD 2x10, RTB flex, ext 2x10 Machine: Knee extension 5# 2x10; flex 10# 2x10 Functional squat x 10   PATIENT EDUCATION:  Education details: HEP updated, advised she may have increased soreness in quads/HS due to leg machine.  Person educated: Patient Education method: Consulting civil engineer, Demonstration, Verbal cues, and Handouts Education comprehension: verbalized understanding and returned demonstration   HOME EXERCISE PROGRAM: Access Code: [place] with Chair Touch  - 2 x daily - 5 x weekly - 1-2 sets - 10 reps - Supine Bridge with Resistance Band  - 1 x daily - 5 x weekly - 1-2 sets - 10 reps  ASSESSMENT:  CLINICAL IMPRESSION: Pam presents today with reports of LBP at 5/10. She tolerated TE well but needs continual VCs for form and speed with standing hip exercises. HEP was updated with bridge and squat as pt did not require cueing with these exercises. Coordinate with husband re: HEP compliance next visit. No goals met as only second visit.   OBJECTIVE IMPAIRMENTS decreased activity tolerance, decreased strength, hypomobility, increased muscle spasms, impaired flexibility, postural dysfunction, and pain.   ACTIVITY LIMITATIONS cleaning and yard work.   PERSONAL FACTORS Behavior pattern and Time since onset of injury/illness/exacerbation are also affecting patient's functional outcome.    REHAB POTENTIAL: Excellent  CLINICAL DECISION MAKING: Stable/uncomplicated  EVALUATION COMPLEXITY: Low    GOALS: Goals reviewed with patient? Yes  SHORT TERM GOALS: Target date: 12/28/2021 (Remove Blue Hyperlink)  Patient will be independent with initial HEP.  Baseline:  Goal status: INITIAL   LONG TERM GOALS: Target date: 01/25/2022  (Remove Blue Hyperlink)  Patient  will be independent with advanced/ongoing HEP to improve outcomes and carryover.  Baseline:  Goal  status: INITIAL  2.  Patient will report 75% improvement in low back pain with ADLS to improve QOL.  Baseline:  Goal status: INITIAL  3.  Patient to demonstrate ability to achieve and maintain good spinal alignment/posturing and body mechanics needed for daily activities. Baseline:  Goal status: INITIAL  4. Patient will demonstrate improved LE strength to 5/5 and improved core strength as demonstrated by her ability to stabilize with MMT of LEs. Baseline:  Goal status: INITIAL  5.  Patient will report 51 on lumbar FOTO to demonstrate improved functional ability.  Baseline: 58 Goal status: INITIAL    PLAN: PT FREQUENCY: 2x/week  PT DURATION: 6 weeks  PLANNED INTERVENTIONS: Therapeutic exercises, Therapeutic activity, Neuromuscular re-education, Patient/Family education, Dry Needling, Electrical stimulation, Spinal mobilization, Cryotherapy, Moist heat, Taping, Traction, and Manual therapy.  PLAN FOR NEXT SESSION: discuss HEP with husband, DN/MT to lumbar gluteals, core and LE strengthening   Marshea Wisher, PT 12/21/2021, 11:59 AM

## 2021-12-21 ENCOUNTER — Ambulatory Visit: Payer: Medicare HMO | Admitting: Physical Therapy

## 2021-12-21 ENCOUNTER — Encounter: Payer: Self-pay | Admitting: Physical Therapy

## 2021-12-21 ENCOUNTER — Other Ambulatory Visit: Payer: Self-pay

## 2021-12-21 DIAGNOSIS — G8929 Other chronic pain: Secondary | ICD-10-CM | POA: Diagnosis not present

## 2021-12-21 DIAGNOSIS — M545 Low back pain, unspecified: Secondary | ICD-10-CM | POA: Diagnosis not present

## 2021-12-21 DIAGNOSIS — M5459 Other low back pain: Secondary | ICD-10-CM

## 2021-12-21 DIAGNOSIS — M47816 Spondylosis without myelopathy or radiculopathy, lumbar region: Secondary | ICD-10-CM | POA: Diagnosis not present

## 2021-12-21 DIAGNOSIS — R252 Cramp and spasm: Secondary | ICD-10-CM | POA: Diagnosis not present

## 2022-01-02 ENCOUNTER — Encounter: Payer: Self-pay | Admitting: Physical Therapy

## 2022-01-02 ENCOUNTER — Ambulatory Visit: Payer: Medicare HMO | Attending: Internal Medicine | Admitting: Physical Therapy

## 2022-01-02 DIAGNOSIS — M5459 Other low back pain: Secondary | ICD-10-CM

## 2022-01-02 DIAGNOSIS — R252 Cramp and spasm: Secondary | ICD-10-CM

## 2022-01-02 NOTE — Therapy (Signed)
OUTPATIENT PHYSICAL THERAPY TREATMENT NOTE   Patient Name: Isabel [name] Work [name] MRN: [id number] DOB:1955-09-03, 66 y.o., female Today's Date: 01/02/2022   PT End of Session - 01/02/22 1315     Visit Number 3    Date for PT Re-Evaluation 01/25/22    Authorization Type AETNA MCR    Progress Note Due on Visit 10    PT Start Time 1315    PT Stop Time 1829    PT Time Calculation (min) 43 min    Activity Tolerance Patient tolerated treatment well    Behavior During Therapy WFL for tasks assessed/performed           Rationale for Evaluation and Treatment Rehabilitation    Past Medical History:  Diagnosis Date   Carpal tunnel syndrome on right    History of concussion    1972, 1977, 1983   Hyperlipidemia    Hypertension    Migraines    pain clinic [place]   Osteoporosis    Past Surgical History:  Procedure Laterality Date   abdominal wall tear     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   fractured right wrist     LEEP     early 20's   [place] Right 1973   Patient Active Problem List   Diagnosis Date Noted   Microalbuminuria 12/15/2021   Dementia without behavioral disturbance, psychotic disturbance, mood disturbance, or anxiety ([place]) 08/22/2021   Cognitive impairment 05/04/2021   CKD (chronic kidney disease) stage 2, GFR 60-89 ml/min 07/22/2020   Adult BMI <19 kg/sq m 07/21/2020   Right bundle branch block (RBBB) on electrocardiogram (ECG) 07/21/2019   Insomnia 10/02/2018   Hypothyroidism 05/21/2017   Age-related osteoporosis without current pathological fracture 11/24/2016   Vitamin D deficiency 09/02/2014   Medication management 09/02/2014   GERD (gastroesophageal reflux disease) 01/18/2014   Hypertension    Hyperlipidemia    Cervical spondylosis without myelopathy 11/21/2011   Headache, migraine 11/21/2011    PCP: Unk Pinto, MD   REFERRING PROVIDER: Unk Pinto, MD   REFERRING DIAG:  [phone] (ICD-10-CM) -  Osteoarthritis of lumbar spine, unspecified spinal osteoarthritis complication status  C78.93,Y10.17 (ICD-10-CM) - Chronic bilateral low back pain without sciatica    THERAPY DIAG:  Other low back pain  Cramp and spasm  ONSET DATE: 10/29/21  SUBJECTIVE:                                                                                                                                                                                           SUBJECTIVE STATEMENT: "It's ok"  Reports compliance with HEP.  PERTINENT HISTORY:  Dementia, Osteoporosis, migraines, HTN, CTS R  PAIN:  Are you having pain?  Yes: NPRS scale: 5/10 Pain location: Bilateral LB Pain description: nerve pain Aggravating factors: bending/gardening Relieving factors: heat   PRECAUTIONS: None  WEIGHT BEARING RESTRICTIONS No  FALLS:  Has patient fallen in last 6 months? No  LIVING ENVIRONMENT: Lives with: lives with their spouse Lives in: House/apartment Stairs: Yes: Internal: 12 steps; on right going up; 2 external steps  Has following equipment at home: None  OCCUPATION: retired; likes gardening  PLOF: Independent  PATIENT GOALS Less pain with ADLS   OBJECTIVE:   DIAGNOSTIC FINDINGS:  None recent  PATIENT SURVEYS:  FOTO 58 (predicted 9)  MUSCLE LENGTH: Tight R HS, L piriformis  PALPATION: Left gluteals and pf; Right gluteals CPA and R UPA pain at L2/3 and L3/4  LUMBAR ROM:   Active  A/PROM  12/14/2021  Flexion Full  Extension Full  Right lateral flexion Full  Left lateral flexion Full  Right rotation Mild tightness  Left rotation Mild tightness   (Blank rows = not tested)  LE ROM: WNL   LE MMT:  MMT Right 12/14/2021 Left 12/14/2021  Hip flexion 4 4-  Hip extension 4 4-  Hip abduction 4+ 5  Hip adduction    Knee flexion 4- 4-  Knee extension 5 5  Ankle dorsiflexion 5 5   (Blank rows = not tested)    TODAY'S TREATMENT  01/02/2022 Therapeutic Exercise: to improve  strength and mobility.  Demo, verbal and tactile cues throughout for technique. Bike L2 x 3 min  Squats 2 x 10  LTR x 10  Windshield wipers x 10 with stretch at end-range Bridge with hip abduction RTB 2 x 10  Supine clams RTB 2 x 10  Clams (no resistance) 2 x 10 bil - tactile cues to prevent rolling back with in L SL Prone Hamstring curls x 10 bil  Manual Therapy: to decrease muscle spasm and pain and improve mobility STM/TPR bil lumbar paraspinals, IASTM with foam roller to bil gluteals, UPA mobs L4-5 grade 1-2  12/21/21:  Nustep L5 x 6 min  Supine HS stretch with strap 2x30 sec Bil (some nerve tension on L if goes too far) Sciatic nerve glide L 5 sec on/off x 10 with ankle DF LTR 10 sec hold x 5 each way Piriformis stretch supine 2x 30 sec Bil Bridge with GTB 5 sec x 10 Standing hip GTB ABD 2x10, RTB flex, ext 2x10 Machine: Knee extension 5# 2x10; flex 10# 2x10 Functional squat x 10   PATIENT EDUCATION:  Education details: HEP update Person educated: Patient and Spouse Education method: Explanation, Demonstration, Verbal cues, and Handouts Education comprehension: verbalized understanding and returned demonstration   HOME EXERCISE PROGRAM: Access Code: [email or web address]  ASSESSMENT:  CLINICAL IMPRESSION: Pam continues to report LBP.  Today reviewed and progressed gluteal strengthening, noted weakness with supine clams.  Reported increased pressure with prone positioning, placed pillow under hips.  Noted tenderness in bil lumbar paraspinals, very sensitive to lumbar mobs. Progressed HEP, reviewed with both Pam and husband.  She would benefit from continued skilled therapy.     OBJECTIVE IMPAIRMENTS decreased activity tolerance, decreased strength, hypomobility, increased muscle spasms, impaired flexibility, postural dysfunction, and pain.   ACTIVITY LIMITATIONS cleaning and yard work.   PERSONAL FACTORS Behavior pattern and Time since onset of injury/illness/exacerbation are also  affecting patient's functional outcome.    REHAB POTENTIAL: Excellent  CLINICAL DECISION MAKING: Stable/uncomplicated  EVALUATION COMPLEXITY: Low  GOALS: Goals reviewed with patient? Yes  SHORT TERM GOALS: Target date: 12/28/2021 (Remove Blue Hyperlink)  Patient will be independent with initial HEP.  Baseline:  Goal status: IN PROGRESS   LONG TERM GOALS: Target date: 01/25/2022  (Remove Blue Hyperlink)  Patient will be independent with advanced/ongoing HEP to improve outcomes and carryover.  Baseline:  Goal status: IN PROGRESS  2.  Patient will report 75% improvement in low back pain with ADLS to improve QOL.  Baseline:  Goal status: IN PROGRESS  3.  Patient to demonstrate ability to achieve and maintain good spinal alignment/posturing and body mechanics needed for daily activities. Baseline:  Goal status: IN PROGRESS  4. Patient will demonstrate improved LE strength to 5/5 and improved core strength as demonstrated by her ability to stabilize with MMT of LEs. Baseline:  Goal status: IN PROGRESS  5.  Patient will report 69 on lumbar FOTO to demonstrate improved functional ability.  Baseline: 58 Goal status: IN PROGRESS    PLAN: PT FREQUENCY: 2x/week  PT DURATION: 6 weeks  PLANNED INTERVENTIONS: Therapeutic exercises, Therapeutic activity, Neuromuscular re-education, Patient/Family education, Dry Needling, Electrical stimulation, Spinal mobilization, Cryotherapy, Moist heat, Taping, Traction, and Manual therapy.  PLAN FOR NEXT SESSION: continue to strengthen lumbar glutes, core, LE, discuss HEP with husband, DN/MT to lumbar PRN  Rennie Natter, PT, DPT  01/02/2022, 2:42 PM

## 2022-01-05 ENCOUNTER — Ambulatory Visit: Payer: Medicare HMO | Admitting: Physical Therapy

## 2022-01-05 ENCOUNTER — Encounter: Payer: Self-pay | Admitting: Physical Therapy

## 2022-01-05 DIAGNOSIS — M5459 Other low back pain: Secondary | ICD-10-CM | POA: Diagnosis not present

## 2022-01-05 DIAGNOSIS — R252 Cramp and spasm: Secondary | ICD-10-CM

## 2022-01-05 NOTE — Therapy (Signed)
OUTPATIENT PHYSICAL THERAPY TREATMENT NOTE   Patient Name: Isabel [name] Isabel [name] MRN: [id number] DOB:01/18/1956, 66 y.o., female Today's Date: 01/05/2022   PT End of Session - 01/05/22 1319     Visit Number 4    Date for PT Re-Evaluation 01/25/22    Authorization Type [employer] MCR    Progress Note Due on Visit 10    PT Start Time 3875    PT Stop Time 1356    PT Time Calculation (min) 39 min    Activity Tolerance Patient tolerated treatment well    Behavior During Therapy WFL for tasks assessed/performed           Rationale for Evaluation and Treatment Rehabilitation    Past Medical History:  Diagnosis Date   Carpal tunnel syndrome on right    History of concussion    1972, 1977, 1983   Hyperlipidemia    Hypertension    Migraines    pain clinic [facility]   Osteoporosis    Past Surgical History:  Procedure Laterality Date   abdominal wall tear     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   fractured right wrist     LEEP     early 20's   [place] Right 1973   Patient Active Problem List   Diagnosis Date Noted   Microalbuminuria 12/15/2021   Dementia without behavioral disturbance, psychotic disturbance, mood disturbance, or anxiety ([place]) 08/22/2021   Cognitive impairment 05/04/2021   CKD (chronic kidney disease) stage 2, GFR 60-89 ml/min 07/22/2020   Adult BMI <19 kg/sq m 07/21/2020   Right bundle branch block (RBBB) on electrocardiogram (ECG) 07/21/2019   Insomnia 10/02/2018   Hypothyroidism 05/21/2017   Age-related osteoporosis without current pathological fracture 11/24/2016   Vitamin D deficiency 09/02/2014   Medication management 09/02/2014   GERD (gastroesophageal reflux disease) 01/18/2014   Hypertension    Hyperlipidemia    Cervical spondylosis without myelopathy 11/21/2011   Headache, migraine 11/21/2011    PCP: Unk Pinto, MD   REFERRING PROVIDER: Unk Pinto, MD   REFERRING DIAG:  [phone] (ICD-10-CM) -  Osteoarthritis of lumbar spine, unspecified spinal osteoarthritis complication status  J18.84,Z66.06 (ICD-10-CM) - Chronic bilateral low back pain without sciatica    THERAPY DIAG:  Other low back pain  Cramp and spasm  ONSET DATE: 10/29/21  SUBJECTIVE:                                                                                                                                                                                           SUBJECTIVE STATEMENT: "Just a little pain in my low back."  PERTINENT HISTORY:  Dementia, Osteoporosis, migraines, HTN, CTS R  PAIN:  Are you having pain?  Yes: NPRS scale: 5/10 Pain location: Bilateral LB Pain description: nerve pain Aggravating factors: bending/gardening Relieving factors: heat   PRECAUTIONS: None  WEIGHT BEARING RESTRICTIONS No  FALLS:  Has patient fallen in last 6 months? No  LIVING ENVIRONMENT: Lives with: lives with their spouse Lives in: House/apartment Stairs: Yes: Internal: 12 steps; on right going up; 2 external steps  Has following equipment at home: None  OCCUPATION: retired; likes gardening  PLOF: Independent  PATIENT GOALS Less pain with ADLS   OBJECTIVE:   DIAGNOSTIC FINDINGS:  None recent  PATIENT SURVEYS:  FOTO 58 (predicted 71)  MUSCLE LENGTH: Tight R HS, L piriformis  PALPATION: Left gluteals and pf; Right gluteals CPA and R UPA pain at L2/3 and L3/4  LUMBAR ROM:   Active  A/PROM  12/14/2021  Flexion Full  Extension Full  Right lateral flexion Full  Left lateral flexion Full  Right rotation Mild tightness  Left rotation Mild tightness   (Blank rows = not tested)  LE ROM: WNL   LE MMT:  MMT Right 12/14/2021 Left 12/14/2021  Hip flexion 4 4-  Hip extension 4 4-  Hip abduction 4+ 5  Hip adduction    Knee flexion 4- 4-  Knee extension 5 5  Ankle dorsiflexion 5 5   (Blank rows = not tested)    TODAY'S TREATMENT  01/05/2022 Therapeutic Exercise: to improve strength  and mobility.  Demo, verbal and tactile cues throughout for technique. Nustep L 5 x 5 min  At counter for support: Heel raises x 20 Squats x 20 Hip abduction RTB 2 x 10 bil  Hip extension RTB 2 x 10 bil  Hip flexion RTB 2 x 10 bil  Forward T's - x 10 bil, poor return demo even with cues Lunges x 10 bi l- using bolster to position legs correctly and dropping to tap back knee on bolster Supine - windshield wipers x 10, Bridges x 10 SL - clams RTB 2 x 10 bil   Prone hamstring curls x 10 bil (pillow under abdomen) Prone hip extension x 10 bil  Manual Therapy: to decrease muscle spasm and pain and improve mobility, pillow under abdomen IASTM/STM bil lumbar paraspinals, bil gluteals , UPA mobs L4-5 grade 1-2, MFR bil QL   01/02/2022 Therapeutic Exercise: to improve strength and mobility.  Demo, verbal and tactile cues throughout for technique. Bike L2 x 3 min  Squats 2 x 10  LTR x 10  Windshield wipers x 10 with stretch at end-range Bridge with hip abduction RTB 2 x 10  Supine clams RTB 2 x 10  Clams (no resistance) 2 x 10 bil - tactile cues to prevent rolling back with in L SL Prone Hamstring curls x 10 bil  Manual Therapy: to decrease muscle spasm and pain and improve mobility STM/TPR bil lumbar paraspinals, IASTM with foam roller to bil gluteals, UPA mobs L4-5 grade 1-2  12/21/21:  Nustep L5 x 6 min  Supine HS stretch with strap 2x30 sec Bil (some nerve tension on L if goes too far) Sciatic nerve glide L 5 sec on/off x 10 with ankle DF LTR 10 sec hold x 5 each way Piriformis stretch supine 2x 30 sec Bil Bridge with GTB 5 sec x 10 Standing hip GTB ABD 2x10, RTB flex, ext 2x10 Machine: Knee extension 5# 2x10; flex 10# 2x10 Functional squat x 10   PATIENT EDUCATION:  Education details: HEP update Person educated: Patient and Spouse Education method: Explanation, Demonstration, Verbal cues, and Handouts Education comprehension: verbalized understanding and returned  demonstration   HOME EXERCISE PROGRAM: Access Code: [email or web address]  ASSESSMENT:  CLINICAL IMPRESSION: Isabel [name] participated well with exercises without increased pain, but had difficulty with more complicated exercises like lunges and forward Ts needing a lot of cuing and demo but still had difficulty with form.  She reported some discomfort with TB on back of LLE with clams.  Otherwise no report of pain, and tolerated MT with no report of tenderness today.  Declined HEP update, to continue current HEP.       OBJECTIVE IMPAIRMENTS decreased activity tolerance, decreased strength, hypomobility, increased muscle spasms, impaired flexibility, postural dysfunction, and pain.   ACTIVITY LIMITATIONS cleaning and yard Isabel.   PERSONAL FACTORS Behavior pattern and Time since onset of injury/illness/exacerbation are also affecting patient's functional outcome.    REHAB POTENTIAL: Excellent  CLINICAL DECISION MAKING: Stable/uncomplicated  EVALUATION COMPLEXITY: Low    GOALS: Goals reviewed with patient? Yes  SHORT TERM GOALS: Target date: 12/28/2021 (Remove Blue Hyperlink)  Patient will be independent with initial HEP.  Baseline:  Goal status: IN PROGRESS   LONG TERM GOALS: Target date: 01/25/2022  (Remove Blue Hyperlink)  Patient will be independent with advanced/ongoing HEP to improve outcomes and carryover.  Baseline:  Goal status: IN PROGRESS  2.  Patient will report 75% improvement in low back pain with ADLS to improve QOL.  Baseline:  Goal status: IN PROGRESS  3.  Patient to demonstrate ability to achieve and maintain good spinal alignment/posturing and body mechanics needed for daily activities. Baseline:  Goal status: IN PROGRESS  4. Patient will demonstrate improved LE strength to 5/5 and improved core strength as demonstrated by her ability to stabilize with MMT of LEs. Baseline:  Goal status: IN PROGRESS  5.  Patient will report 36 on lumbar FOTO to  demonstrate improved functional ability.  Baseline: 58 Goal status: IN PROGRESS    PLAN: PT FREQUENCY: 2x/week  PT DURATION: 6 weeks  PLANNED INTERVENTIONS: Therapeutic exercises, Therapeutic activity, Neuromuscular re-education, Patient/Family education, Dry Needling, Electrical stimulation, Spinal mobilization, Cryotherapy, Moist heat, Taping, Traction, and Manual therapy.  PLAN FOR NEXT SESSION: continue to strengthen lumbar glutes, core, LE, MT to lumbar PRN  Rennie Natter, PT, DPT  01/05/2022, 2:01 PM

## 2022-01-09 ENCOUNTER — Ambulatory Visit: Payer: Medicare HMO | Admitting: Physical Therapy

## 2022-01-09 ENCOUNTER — Encounter: Payer: Self-pay | Admitting: Physical Therapy

## 2022-01-09 DIAGNOSIS — R252 Cramp and spasm: Secondary | ICD-10-CM

## 2022-01-09 DIAGNOSIS — M5459 Other low back pain: Secondary | ICD-10-CM

## 2022-01-09 NOTE — Therapy (Signed)
OUTPATIENT PHYSICAL THERAPY TREATMENT NOTE   Patient Name: Isabel [name] Work [name] MRN: [id number] DOB:[birth year], 66 y.o., female Today's Date: 01/09/2022   PT End of Session - 01/09/22 1313     Visit Number 5    Date for PT Re-Evaluation 01/25/22    Authorization Type [employer] MCR    Progress Note Due on Visit 10    PT Start Time 1315    PT Stop Time 1356    PT Time Calculation (min) 41 min    Activity Tolerance Patient tolerated treatment well    Behavior During Therapy WFL for tasks assessed/performed           Rationale for Evaluation and Treatment Rehabilitation    Past Medical History:  Diagnosis Date   Carpal tunnel syndrome on right    History of concussion    1972, 1977, 1983   Hyperlipidemia    Hypertension    Migraines    pain clinic [place]   Osteoporosis    Past Surgical History:  Procedure Laterality Date   abdominal wall tear     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   fractured right wrist     LEEP     early 20's   [place] Right 1973   Patient Active Problem List   Diagnosis Date Noted   Microalbuminuria 12/15/2021   Dementia without behavioral disturbance, psychotic disturbance, mood disturbance, or anxiety ([place]) 08/22/2021   Cognitive impairment 05/04/2021   CKD (chronic kidney disease) stage 2, GFR 60-89 ml/min 07/22/2020   Adult BMI <19 kg/sq m 07/21/2020   Right bundle branch block (RBBB) on electrocardiogram (ECG) 07/21/2019   Insomnia 10/02/2018   Hypothyroidism 05/21/2017   Age-related osteoporosis without current pathological fracture 11/24/2016   Vitamin D deficiency 09/02/2014   Medication management 09/02/2014   GERD (gastroesophageal reflux disease) 01/18/2014   Hypertension    Hyperlipidemia    Cervical spondylosis without myelopathy 11/21/2011   Headache, migraine 11/21/2011    PCP: Unk Pinto, MD   REFERRING PROVIDER: Unk Pinto, MD   REFERRING DIAG:  [id number] (ICD-10-CM) -  Osteoarthritis of lumbar spine, unspecified spinal osteoarthritis complication status  G99.24,Q68.34 (ICD-10-CM) - Chronic bilateral low back pain without sciatica    THERAPY DIAG:  Other low back pain  Cramp and spasm  ONSET DATE: 10/29/21  SUBJECTIVE:                                                                                                                                                                                           SUBJECTIVE STATEMENT: "Just a little pain in my low back."  Reports compliance with HEP.     PERTINENT HISTORY:  Dementia, Osteoporosis, migraines, HTN, CTS R  PAIN:  Are you having pain?  Yes: NPRS scale: 3/10 Pain location: Bilateral LB Pain description: nerve pain Aggravating factors: bending/gardening Relieving factors: heat   PRECAUTIONS: None  WEIGHT BEARING RESTRICTIONS No  FALLS:  Has patient fallen in last 6 months? No  LIVING ENVIRONMENT: Lives with: lives with their spouse Lives in: House/apartment Stairs: Yes: Internal: 12 steps; on right going up; 2 external steps  Has following equipment at home: None  OCCUPATION: retired; likes gardening  PLOF: Independent  PATIENT GOALS Less pain with ADLS   OBJECTIVE:   DIAGNOSTIC FINDINGS:  None recent  PATIENT SURVEYS:  FOTO 58 (predicted 29)  MUSCLE LENGTH: Tight R HS, L piriformis  PALPATION: Left gluteals and pf; Right gluteals CPA and R UPA pain at L2/3 and L3/4  LUMBAR ROM:   Active  A/PROM  12/14/2021  Flexion Full  Extension Full  Right lateral flexion Full  Left lateral flexion Full  Right rotation Mild tightness  Left rotation Mild tightness   (Blank rows = not tested)  LE ROM: WNL   LE MMT:  MMT Right 12/14/2021 Left 12/14/2021  Hip flexion 4 4-  Hip extension 4 4-  Hip abduction 4+ 5  Hip adduction    Knee flexion 4- 4-  Knee extension 5 5  Ankle dorsiflexion 5 5   (Blank rows = not tested)    TODAY'S TREATMENT  01/09/2022 Therapeutic  Exercise: to improve strength and mobility.  Demo, verbal and tactile cues throughout for technique. Gait x 5 min warm-up (including 1 flight of stairs) At counter for support: Heel raises x 10 Hip abduction RTB  x 15 bil  Hip extension RTB x 15 bil  Hip flexion RTB x 15 bil Side steps with RTB x 15' bil  Monster walks RTB  2 x 15' - constant cues needed  Seated hip flexion - stepping over small roller x 10 bil  Seated piriformis stretches 2 x 20 sec bil Supine clams RTB x 15 Bridges with RTB 2 x 10  Clams RTB 2 x 10 bil   Prone leg extensions x 10 bil Prone HS curls x 10 RTB bil  Manual Therapy: to decrease muscle spasm and pain and improve mobility, pillow under abdomen IASTM/STM bil lumbar paraspinals, bil gluteals , R UPA mobs T12-L5 grade 1-2, TPR R lumbar erector spinae, MFR bil QL  01/05/2022 Therapeutic Exercise: to improve strength and mobility.  Demo, verbal and tactile cues throughout for technique. Nustep L 5 x 5 min  At counter for support: Heel raises x 20 Squats x 20 Hip abduction RTB 2 x 10 bil  Hip extension RTB 2 x 10 bil  Hip flexion RTB 2 x 10 bil  Forward T's - x 10 bil, poor return demo even with cues Lunges x 10 bi l- using bolster to position legs correctly and dropping to tap back knee on bolster Supine - windshield wipers x 10, Bridges x 10 SL - clams RTB 2 x 10 bil   Prone hamstring curls x 10 bil (pillow under abdomen) Prone hip extension x 10 bil  Manual Therapy: to decrease muscle spasm and pain and improve mobility, pillow under abdomen IASTM/STM bil lumbar paraspinals, bil gluteals , UPA mobs L4-5 grade 1-2, MFR bil QL   01/02/2022 Therapeutic Exercise: to improve strength and mobility.  Demo, verbal and tactile cues throughout for technique. Bike L2 x  3 min  Squats 2 x 10  LTR x 10  Windshield wipers x 10 with stretch at end-range Bridge with hip abduction RTB 2 x 10  Supine clams RTB 2 x 10  Clams (no resistance) 2 x 10 bil - tactile cues to  prevent rolling back with in L SL Prone Hamstring curls x 10 bil Manual Therapy: to decrease muscle spasm and pain and improve mobility STM/TPR bil lumbar paraspinals, IASTM with foam roller to bil gluteals, UPA mobs L4-5 grade 1-2   PATIENT EDUCATION:  Education details: continue HEP Person educated: Patient Education method: Explanation Education comprehension: verbalized understanding   HOME EXERCISE PROGRAM: Access Code: [email or web address]  ASSESSMENT:  CLINICAL IMPRESSION: Amity Roes Work [name] reports less pain today, and also performed exercises with better form and less complaint of discomfort, especially clams.  Still needed constant cues with more complicated exercises like monster walks today. Noted more trigger points today in R erector spinae in upper lumbar/lower thoracic spine today.  Reported decreased spasm after manual therapy.  She would benefit from continued skilled therapy.    OBJECTIVE IMPAIRMENTS decreased activity tolerance, decreased strength, hypomobility, increased muscle spasms, impaired flexibility, postural dysfunction, and pain.   ACTIVITY LIMITATIONS cleaning and yard work.   PERSONAL FACTORS Behavior pattern and Time since onset of injury/illness/exacerbation are also affecting patient's functional outcome.    REHAB POTENTIAL: Excellent  CLINICAL DECISION MAKING: Stable/uncomplicated  EVALUATION COMPLEXITY: Low    GOALS: Goals reviewed with patient? Yes  SHORT TERM GOALS: Target date: 12/28/2021   Patient will be independent with initial HEP.  Baseline:  Goal status: IN PROGRESS   LONG TERM GOALS: Target date: 01/25/2022   Patient will be independent with advanced/ongoing HEP to improve outcomes and carryover.  Baseline:  Goal status: IN PROGRESS  2.  Patient will report 75% improvement in low back pain with ADLS to improve QOL.  Baseline:  Goal status: IN PROGRESS  3.  Patient to demonstrate ability to achieve and maintain good spinal  alignment/posturing and body mechanics needed for daily activities. Baseline:  Goal status: IN PROGRESS  4. Patient will demonstrate improved LE strength to 5/5 and improved core strength as demonstrated by her ability to stabilize with MMT of LEs. Baseline:  Goal status: IN PROGRESS  5.  Patient will report 41 on lumbar FOTO to demonstrate improved functional ability.  Baseline: 58 Goal status: IN PROGRESS    PLAN: PT FREQUENCY: 2x/week  PT DURATION: 6 weeks  PLANNED INTERVENTIONS: Therapeutic exercises, Therapeutic activity, Neuromuscular re-education, Patient/Family education, Dry Needling, Electrical stimulation, Spinal mobilization, Cryotherapy, Moist heat, Taping, Traction, and Manual therapy.  PLAN FOR NEXT SESSION: continue to strengthen lumbar glutes, core, LE, MT to lumbar PRN  Rennie Natter, PT, DPT  01/09/2022, 1:59 PM

## 2022-01-12 ENCOUNTER — Encounter: Payer: Medicare HMO | Admitting: Physical Therapy

## 2022-01-16 ENCOUNTER — Encounter: Payer: Self-pay | Admitting: Physical Therapy

## 2022-01-16 ENCOUNTER — Ambulatory Visit: Payer: Medicare HMO | Admitting: Physical Therapy

## 2022-01-16 DIAGNOSIS — R252 Cramp and spasm: Secondary | ICD-10-CM | POA: Diagnosis not present

## 2022-01-16 DIAGNOSIS — M5459 Other low back pain: Secondary | ICD-10-CM | POA: Diagnosis not present

## 2022-01-16 NOTE — Therapy (Signed)
OUTPATIENT PHYSICAL THERAPY TREATMENT NOTE   Patient Name: Isabel [name] [name] Isabel [name] MRN: [id number] DOB:1956/01/18, 66 y.o., female Today's Date: 01/16/2022   PT End of Session - 01/16/22 1315     Visit Number 6    Date for PT Re-Evaluation 01/25/22    Authorization Type [employer]ET[employer] MCR    Progress Note Due on Visit 10    PT Start Time 1315    PT Stop Time 9323    PT Time Calculation (min) 40 min    Activity Tolerance Patient tolerated treatment well    Behavior During Therapy [facility] for tasks assessed/performed           Rationale for Evaluation and Treatment Rehabilitation    Past Medical History:  Diagnosis Date   Carpal tunnel syndrome on right    History of concussion    1972, 1977, 1983   Hyperlipidemia    Hypertension    Migraines    pain clinic [place]   Osteoporosis    Past Surgical History:  Procedure Laterality Date   abdominal wall tear     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   fractured right wrist     LEEP     early 20's   [place] Right 1973   Patient Active Problem List   Diagnosis Date Noted   Microalbuminuria 12/15/2021   Dementia without behavioral disturbance, psychotic disturbance, mood disturbance, or anxiety ([place]) 08/22/2021   Cognitive impairment 05/04/2021   CKD (chronic kidney disease) stage 2, GFR 60-89 ml/min 07/22/2020   Adult BMI <19 kg/sq m 07/21/2020   Right bundle branch block (RBBB) on electrocardiogram (ECG) 07/21/2019   Insomnia 10/02/2018   Hypothyroidism 05/21/2017   Age-related osteoporosis without current pathological fracture 11/24/2016   Vitamin D deficiency 09/02/2014   Medication management 09/02/2014   GERD (gastroesophageal reflux disease) 01/18/2014   Hypertension    Hyperlipidemia    Cervical spondylosis without myelopathy 11/21/2011   Headache, migraine 11/21/2011    PCP: Unk Pinto, MD   REFERRING PROVIDER: Unk Pinto, MD   REFERRING DIAG:  [phone] (ICD-10-CM) -  Osteoarthritis of lumbar spine, unspecified spinal osteoarthritis complication status  G25.42,H06.23 (ICD-10-CM) - Chronic bilateral low back pain without sciatica    THERAPY DIAG:  Other low back pain  Cramp and spasm  ONSET DATE: 10/29/21  SUBJECTIVE:                                                                                                                                                                                           SUBJECTIVE STATEMENT: "No pain to speak of."  PERTINENT HISTORY:  Dementia, Osteoporosis, migraines, HTN, CTS R  PAIN:  Are you having pain?  No: NPRS scale: 0/10 Pain location: Bilateral LB Pain description: nerve pain Aggravating factors: bending/gardening Relieving factors: heat   PRECAUTIONS: None  WEIGHT BEARING RESTRICTIONS No  FALLS:  Has patient fallen in last 6 months? No  LIVING ENVIRONMENT: Lives with: lives with their spouse Lives in: House/apartment Stairs: Yes: Internal: 12 steps; on right going up; 2 external steps  Has following equipment at home: None  OCCUPATION: retired; likes gardening  PLOF: Independent  PATIENT GOALS Less pain with ADLS   OBJECTIVE:   DIAGNOSTIC FINDINGS:  None recent  PATIENT SURVEYS:  FOTO 58 (predicted 5)  MUSCLE LENGTH: Tight R HS, L piriformis  PALPATION: Left gluteals and pf; Right gluteals CPA and R UPA pain at L2/3 and L3/4  LUMBAR ROM:   Active  A/PROM  12/14/2021  Flexion Full  Extension Full  Right lateral flexion Full  Left lateral flexion Full  Right rotation Mild tightness  Left rotation Mild tightness   (Blank rows = not tested)  LE ROM: WNL   LE MMT:  MMT Right 12/14/2021 Left 12/14/2021  Hip flexion 4 4-  Hip extension 4 4-  Hip abduction 4+ 5  Hip adduction    Knee flexion 4- 4-  Knee extension 5 5  Ankle dorsiflexion 5 5   (Blank rows = not tested)    TODAY'S TREATMENT  01/16/2022 Therapeutic Exercise: to improve strength and mobility.   Demo, verbal and tactile cues throughout for technique. Gait x 1500' 6 minutes Squats 2 x 10  Side steps RTB 2 x 20' bil Monster walks RTB 2 x 20' - cues needed throughout [place] with RTB 2 x 20 Supine clams RTB 2 x 15 S/L clams RTB 2 x 10 bil  S/L reverse clams 2 x 10 bil  Prone leg extensions 2 x 10 bil  Bird dogs 2 x 10 bil - struggled with L leg/R arm extension  Child pose between sets of bird dogs to relax low back  Manual Therapy: to decrease muscle spasm and pain and improve mobility, pillow under abdomen STM bil lumbar paraspinals, bil gluteals , R UPA mobs T12-L5 grade 1-2, TPR R lumbar erector spinae, MFR bil QL  01/09/2022 Therapeutic Exercise: to improve strength and mobility.  Demo, verbal and tactile cues throughout for technique. Gait x 5 min warm-up (including 1 flight of stairs) At counter for support: Heel raises x 10 Hip abduction RTB  x 15 bil  Hip extension RTB x 15 bil  Hip flexion RTB x 15 bil Side steps with RTB x 15' bil  Monster walks RTB  2 x 15' - constant cues needed  Seated hip flexion - stepping over small roller x 10 bil  Seated piriformis stretches 2 x 20 sec bil Supine clams RTB x 15 Bridges with RTB 2 x 10  Clams RTB 2 x 10 bil   Prone leg extensions x 10 bil Prone HS curls x 10 RTB bil  Manual Therapy: to decrease muscle spasm and pain and improve mobility, pillow under abdomen IASTM/STM bil lumbar paraspinals, bil gluteals , R UPA mobs T12-L5 grade 1-2, TPR R lumbar erector spinae, MFR bil QL  01/05/2022 Therapeutic Exercise: to improve strength and mobility.  Demo, verbal and tactile cues throughout for technique. Nustep L 5 x 5 min  At counter for support: Heel raises x 20 Squats x 20 Hip abduction RTB 2 x 10 bil  Hip extension RTB 2 x 10 bil  Hip flexion RTB 2 x 10 bil  Forward T's - x 10 bil, poor return demo even with cues Lunges x 10 bi l- using bolster to position legs correctly and dropping to tap back knee on bolster Supine -  windshield wipers x 10, Bridges x 10 SL - clams RTB 2 x 10 bil   Prone hamstring curls x 10 bil (pillow under abdomen) Prone hip extension x 10 bil  Manual Therapy: to decrease muscle spasm and pain and improve mobility, pillow under abdomen IASTM/STM bil lumbar paraspinals, bil gluteals , UPA mobs L4-5 grade 1-2, MFR bil QL    PATIENT EDUCATION:  Education details: continue HEP Person educated: Patient Education method: Explanation Education comprehension: verbalized understanding   HOME EXERCISE PROGRAM: Access Code: [email or web address]  ASSESSMENT:  CLINICAL IMPRESSION: Isabel [name] Isabel [name]  is making good progress, reporting less pain and noted significant decrease in tenderness/pain with manual therapy today.  She still needs a lot of cuing with exercise to perform correctly, and demonstrates R hip weakness, but strength continues to improve.     OBJECTIVE IMPAIRMENTS decreased activity tolerance, decreased strength, hypomobility, increased muscle spasms, impaired flexibility, postural dysfunction, and pain.   ACTIVITY LIMITATIONS cleaning and yard [name].   PERSONAL FACTORS Behavior pattern and Time since onset of injury/illness/exacerbation are also affecting patient's functional outcome.    REHAB POTENTIAL: Excellent  CLINICAL DECISION MAKING: Stable/uncomplicated  EVALUATION COMPLEXITY: Low    GOALS: Goals reviewed with patient? Yes  SHORT TERM GOALS: Target date: 12/28/2021   Patient will be independent with initial HEP.  Baseline:  Goal status: IN PROGRESS   LONG TERM GOALS: Target date: 01/25/2022   Patient will be independent with advanced/ongoing HEP to improve outcomes and carryover.  Baseline:  Goal status: IN PROGRESS  2.  Patient will report 75% improvement in low back pain with ADLS to improve QOL.  Baseline:  Goal status: IN PROGRESS  3.  Patient to demonstrate ability to achieve and maintain good spinal alignment/posturing and body mechanics needed for  daily activities. Baseline:  Goal status: IN PROGRESS  4. Patient will demonstrate improved LE strength to 5/5 and improved core strength as demonstrated by her ability to stabilize with MMT of LEs. Baseline:  Goal status: IN PROGRESS  5.  Patient will report 23 on lumbar FOTO to demonstrate improved functional ability.  Baseline: 58 Goal status: IN PROGRESS    PLAN: PT FREQUENCY: 2x/week  PT DURATION: 6 weeks  PLANNED INTERVENTIONS: Therapeutic exercises, Therapeutic activity, Neuromuscular re-education, Patient/Family education, Dry Needling, Electrical stimulation, Spinal mobilization, Cryotherapy, Moist heat, Taping, Traction, and Manual therapy.  PLAN FOR NEXT SESSION: continue to strengthen lumbar glutes, core, LE, MT to lumbar PRN  Rennie Natter, PT, DPT  01/16/2022, 1:58 PM

## 2022-01-19 ENCOUNTER — Ambulatory Visit: Payer: Medicare HMO

## 2022-01-19 DIAGNOSIS — R252 Cramp and spasm: Secondary | ICD-10-CM | POA: Diagnosis not present

## 2022-01-19 DIAGNOSIS — M5459 Other low back pain: Secondary | ICD-10-CM | POA: Diagnosis not present

## 2022-01-19 NOTE — Therapy (Signed)
OUTPATIENT PHYSICAL THERAPY TREATMENT NOTE   Patient Name: Isabel [name] Work [name] MRN: [id number] DOB:06-Oct-1955, 66 y.o., female Today's Date: 01/19/2022   PT End of Session - 01/19/22 1401     Visit Number 7    Date for PT Re-Evaluation 01/25/22    Authorization Type AET[employer] MCR    Progress Note Due on Visit 10    PT Start Time 1316    PT Stop Time 3545    PT Time Calculation (min) 43 min    Activity Tolerance Patient tolerated treatment well    Behavior During Therapy [facility] for tasks assessed/performed            Rationale for Evaluation and Treatment Rehabilitation    Past Medical History:  Diagnosis Date   Carpal tunnel syndrome on right    History of concussion    1972, 1977, 1983   Hyperlipidemia    Hypertension    Migraines    pain clinic [place]   Osteoporosis    Past Surgical History:  Procedure Laterality Date   abdominal wall tear     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   fractured right wrist     LEEP     early 20's   [place] Right 1973   Patient Active Problem List   Diagnosis Date Noted   Microalbuminuria 12/15/2021   Dementia without behavioral disturbance, psychotic disturbance, mood disturbance, or anxiety ([place]) 08/22/2021   Cognitive impairment 05/04/2021   CKD (chronic kidney disease) stage 2, GFR 60-89 ml/min 07/22/2020   Adult BMI <19 kg/sq m 07/21/2020   Right bundle branch block (RBBB) on electrocardiogram (ECG) 07/21/2019   Insomnia 10/02/2018   Hypothyroidism 05/21/2017   Age-related osteoporosis without current pathological fracture 11/24/2016   Vitamin D deficiency 09/02/2014   Medication management 09/02/2014   GERD (gastroesophageal reflux disease) 01/18/2014   Hypertension    Hyperlipidemia    Cervical spondylosis without myelopathy 11/21/2011   Headache, migraine 11/21/2011    PCP: Unk Pinto, MD   REFERRING PROVIDER: Unk Pinto, MD   REFERRING DIAG:  [phone] (ICD-10-CM) -  Osteoarthritis of lumbar spine, unspecified spinal osteoarthritis complication status  L37.34,K87.68 (ICD-10-CM) - Chronic bilateral low back pain without sciatica    THERAPY DIAG:  Other low back pain  Cramp and spasm  ONSET DATE: 10/29/21  SUBJECTIVE:                                                                                                                                                                                           SUBJECTIVE STATEMENT: No questions/concerns with HEP and just  a little pain today.   PERTINENT HISTORY:  Dementia, Osteoporosis, migraines, HTN, CTS R  PAIN:  Are you having pain?  No: NPRS scale: 4/10 Pain location: Bilateral LB Pain description: nerve pain Aggravating factors: bending/gardening Relieving factors: heat   PRECAUTIONS: None  WEIGHT BEARING RESTRICTIONS No  FALLS:  Has patient fallen in last 6 months? No  LIVING ENVIRONMENT: Lives with: lives with their spouse Lives in: House/apartment Stairs: Yes: Internal: 12 steps; on right going up; 2 external steps  Has following equipment at home: None  OCCUPATION: retired; likes gardening  PLOF: Independent  PATIENT GOALS Less pain with ADLS   OBJECTIVE:   DIAGNOSTIC FINDINGS:  None recent  PATIENT SURVEYS:  FOTO 58 (predicted 13)  MUSCLE LENGTH: Tight R HS, L piriformis  PALPATION: Left gluteals and pf; Right gluteals CPA and R UPA pain at L2/3 and L3/4  LUMBAR ROM:   Active  A/PROM  12/14/2021  Flexion Full  Extension Full  Right lateral flexion Full  Left lateral flexion Full  Right rotation Mild tightness  Left rotation Mild tightness   (Blank rows = not tested)  LE ROM: WNL   LE MMT:  MMT Right 12/14/2021 Left 12/14/2021  Hip flexion 4 4-  Hip extension 4 4-  Hip abduction 4+ 5  Hip adduction    Knee flexion 4- 4-  Knee extension 5 5  Ankle dorsiflexion 5 5   (Blank rows = not tested)    TODAY'S TREATMENT  01/19/22 Therapeutic  Exercise: Nustep L5x21mn Supine clams GTB 15 reps Supine march with GTB 15 reps Bridge with GTB 2x10- reminders needed to keep knees against TB Sidesteps with GTB 2x288fMonster walk 2x25 ft- cues for technique throughout exercise Bird dog 10 reps - difficulty maintaining balance Standing pallof press 10 reps bil Standing high row 15 reps GTB Standing extension with GTB 10 reps Childs pose x 30 sec hold  Manual Therapy: STM to B lumbar paraspinals, R QL  01/16/2022 Therapeutic Exercise: to improve strength and mobility.  Demo, verbal and tactile cues throughout for technique. Gait x 1500' 6 minutes Squats 2 x 10  Side steps RTB 2 x 20' bil Monster walks RTB 2 x 20' - cues needed [place] RTB 2 x 20 Supine clams RTB 2 x 15 S/L clams RTB 2 x 10 bil  S/L reverse clams 2 x 10 bil  Prone leg extensions 2 x 10 bil  Bird dogs 2 x 10 bil - struggled with L leg/R arm extension  Child pose between sets of bird dogs to relax low back  Manual Therapy: to decrease muscle spasm and pain and improve mobility, pillow under abdomen STM bil lumbar paraspinals, bil gluteals , R UPA mobs T12-L5 grade 1-2, TPR R lumbar erector spinae, MFR bil QL  01/09/2022 Therapeutic Exercise: to improve strength and mobility.  Demo, verbal and tactile cues throughout for technique. Gait x 5 min warm-up (including 1 flight of stairs) At counter for support: Heel raises x 10 Hip abduction RTB  x 15 bil  Hip extension RTB x 15 bil  Hip flexion RTB x 15 bil Side steps with RTB x 15' bil  Monster walks RTB  2 x 15' - constant cues needed  Seated hip flexion - stepping over small roller x 10 bil  Seated piriformis stretches 2 x 20 sec bil Supine clams RTB x 15 Bridges with RTB 2 x 10  Clams RTB 2 x 10 bil   Prone leg extensions  x 10 bil Prone HS curls x 10 RTB bil  Manual Therapy: to decrease muscle spasm and pain and improve mobility, pillow under abdomen IASTM/STM bil lumbar paraspinals, bil  gluteals , R UPA mobs T12-L5 grade 1-2, TPR R lumbar erector spinae, MFR bil QL  PATIENT EDUCATION:  Education details: continue HEP Person educated: Patient Education method: Explanation Education comprehension: verbalized understanding   HOME EXERCISE PROGRAM: Access Code: [email or web address]  ASSESSMENT:  CLINICAL IMPRESSION: Pt showed a good response to treatment. Provided cueing throughout session for from and progressed as tolerated according to patient's response. Pt showed more difficulty with antirotation exercises.   OBJECTIVE IMPAIRMENTS decreased activity tolerance, decreased strength, hypomobility, increased muscle spasms, impaired flexibility, postural dysfunction, and pain.   ACTIVITY LIMITATIONS cleaning and yard work.   PERSONAL FACTORS Behavior pattern and Time since onset of injury/illness/exacerbation are also affecting patient's functional outcome.    REHAB POTENTIAL: Excellent  CLINICAL DECISION MAKING: Stable/uncomplicated  EVALUATION COMPLEXITY: Low    GOALS: Goals reviewed with patient? Yes  SHORT TERM GOALS: Target date: 12/28/2021   Patient will be independent with initial HEP.  Baseline:  Goal status: MET (01/19/22)   LONG TERM GOALS: Target date: 01/25/2022   Patient will be independent with advanced/ongoing HEP to improve outcomes and carryover.  Baseline:  Goal status: IN PROGRESS  2.  Patient will report 75% improvement in low back pain with ADLS to improve QOL.  Baseline:  Goal status: IN PROGRESS (pt reports 40% improvement in LBP since starting PT - 01/19/22)  3.  Patient to demonstrate ability to achieve and maintain good spinal alignment/posturing and body mechanics needed for daily activities. Baseline:  Goal status: IN PROGRESS  4. Patient will demonstrate improved LE strength to 5/5 and improved core strength as demonstrated by her ability to stabilize with MMT of LEs. Baseline:  Goal status: IN PROGRESS  5.  Patient will report 86  on lumbar FOTO to demonstrate improved functional ability.  Baseline: 58 Goal status: IN PROGRESS    PLAN: PT FREQUENCY: 2x/week  PT DURATION: 6 weeks  PLANNED INTERVENTIONS: Therapeutic exercises, Therapeutic activity, Neuromuscular re-education, Patient/Family education, Dry Needling, Electrical stimulation, Spinal mobilization, Cryotherapy, Moist heat, Taping, Traction, and Manual therapy.  PLAN FOR NEXT SESSION: continue to strengthen lumbar glutes, core, LE, MT to lumbar PRN  Artist Pais, PTA 01/19/2022, 2:02 PM

## 2022-01-22 ENCOUNTER — Other Ambulatory Visit: Payer: Self-pay | Admitting: Nurse Practitioner

## 2022-01-22 DIAGNOSIS — E782 Mixed hyperlipidemia: Secondary | ICD-10-CM

## 2022-01-23 ENCOUNTER — Ambulatory Visit: Payer: Medicare HMO

## 2022-01-23 DIAGNOSIS — M5459 Other low back pain: Secondary | ICD-10-CM | POA: Diagnosis not present

## 2022-01-23 DIAGNOSIS — R252 Cramp and spasm: Secondary | ICD-10-CM

## 2022-01-25 ENCOUNTER — Ambulatory Visit: Payer: Medicare HMO | Admitting: Physical Therapy

## 2022-01-25 ENCOUNTER — Encounter: Payer: Self-pay | Admitting: Physical Therapy

## 2022-01-25 DIAGNOSIS — R252 Cramp and spasm: Secondary | ICD-10-CM

## 2022-01-25 DIAGNOSIS — M5459 Other low back pain: Secondary | ICD-10-CM

## 2022-01-25 NOTE — Therapy (Signed)
OUTPATIENT PHYSICAL THERAPY TREATMENT NOTE PHYSICAL THERAPY DISCHARGE SUMMARY  Visits from Start of Care: 10  Current functional level related to goals / functional outcomes: Met or partially met all goals, reports 40% improvement overall in LBP, FOTO improved to 65%.  No limitations with ADLs, compliant with HEP.  See assessment.    Remaining deficits: Intermittent LBP   Education / Equipment: HEP, yellow and red theraband  Plan: Patient agrees to discharge.  Patient is being discharged due to meeting the stated rehab goals.       Patient Name: Isabel [name] Work [name] MRN: [id number] DOB:1955-08-24, 66 y.o., female Today's Date: 01/25/2022   PT End of Session - 01/25/22 1315     Visit Number 9    Date for PT Re-Evaluation 01/25/22    Authorization Type [employer]ET[employer] MCR    Progress Note Due on Visit 10    PT Start Time 1315    PT Stop Time 5643    PT Time Calculation (min) 44 min    Activity Tolerance Patient tolerated treatment well    Behavior During Therapy [facility] for tasks assessed/performed             Rationale for Evaluation and Treatment Rehabilitation    Past Medical History:  Diagnosis Date   Carpal tunnel syndrome on right    History of concussion    1972, 1977, 1983   Hyperlipidemia    Hypertension    Migraines    pain clinic [place]   Osteoporosis    Past Surgical History:  Procedure Laterality Date   abdominal wall tear     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   fractured right wrist     LEEP     early 20's   [place] Right 1973   Patient Active Problem List   Diagnosis Date Noted   Microalbuminuria 12/15/2021   Dementia without behavioral disturbance, psychotic disturbance, mood disturbance, or anxiety ([place]) 08/22/2021   Cognitive impairment 05/04/2021   CKD (chronic kidney disease) stage 2, GFR 60-89 ml/min 07/22/2020   Adult BMI <19 kg/sq m 07/21/2020   Right bundle branch block (RBBB) on electrocardiogram (ECG)  07/21/2019   Insomnia 10/02/2018   Hypothyroidism 05/21/2017   Age-related osteoporosis without current pathological fracture 11/24/2016   Vitamin D deficiency 09/02/2014   Medication management 09/02/2014   GERD (gastroesophageal reflux disease) 01/18/2014   Hypertension    Hyperlipidemia    Cervical spondylosis without myelopathy 11/21/2011   Headache, migraine 11/21/2011    PCP: Unk Pinto, MD   REFERRING PROVIDER: Unk Pinto, MD   REFERRING DIAG:  [id number] (ICD-10-CM) - Osteoarthritis of lumbar spine, unspecified spinal osteoarthritis complication status  A41.66,A63.01 (ICD-10-CM) - Chronic bilateral low back pain without sciatica    THERAPY DIAG:  Other low back pain  Cramp and spasm  ONSET DATE: 10/29/21  SUBJECTIVE:  SUBJECTIVE STATEMENT: Pt doing well no new complaints.   PERTINENT HISTORY:  Dementia, Osteoporosis, migraines, HTN, CTS R  PAIN:  Are you having pain?  No: NPRS scale: 4/10 Pain location: Bilateral LB Pain description: nerve pain Aggravating factors: bending/gardening Relieving factors: heat   PRECAUTIONS: None  WEIGHT BEARING RESTRICTIONS No  FALLS:  Has patient fallen in last 6 months? No  LIVING ENVIRONMENT: Lives with: lives with their spouse Lives in: House/apartment Stairs: Yes: Internal: 12 steps; on right going up; 2 external steps  Has following equipment at home: None  OCCUPATION: retired; likes gardening  PLOF: Independent  PATIENT GOALS Less pain with ADLS   OBJECTIVE:   DIAGNOSTIC FINDINGS:  None recent  PATIENT SURVEYS:  FOTO 58 (predicted 66)   01/25/22- 65%  MUSCLE LENGTH: Tight R HS, L piriformis  PALPATION: Left gluteals and pf; Right gluteals CPA and R UPA pain at L2/3 and L3/4  LUMBAR ROM:   Active   A/PROM  12/14/2021  Flexion Full  Extension Full  Right lateral flexion Full  Left lateral flexion Full  Right rotation Mild tightness  Left rotation Mild tightness   (Blank rows = not tested)  LE ROM: WNL   LE MMT:  MMT Right 12/14/2021 Left 12/14/2021 Right 01/25/22 Left  01/25/22  Hip flexion 4 4- 5 5  Hip extension 4 4- 5 5  Hip abduction 4+ _0 Hip adduction   5 5  Knee flexion 4- 4- 5 5  Knee extension _1 Ankle dorsiflexion _2 (Blank rows = not tested)    TODAY'S TREATMENT  01/25/22 Therapeutic Exercise: to improve strength and mobility.  Demo, verbal and tactile cues throughout for technique. Gait x 5 min  Bird dogs 2 x 10 bil  Standing rows RTB 2 x 10 Standing shoulder extension RTB 2 x 10  Paloff walkouts x 5 bil RTB - HOH needed, unable to coordinate [place] with YTB x 20 Supine clams YTB x 20 S/L Clams YTB x 20 bil  Review of HEP Physical Performance MMT, FOTO, review of goals.   01/23/22 Therapeutic Exercise: Nustep L5x54mn Pallof press L side with red TB 20 reps each - unable to coordinate with R side Shoulder extension with red TB 15 reps - cues to keep elbows straight Standing bird dog 10 reps bil  - cues for proper sequence of movement BATCA row 20# 2x10- cues to avoid trunk extension Standing hip flexion, hip extension yellow TB 10 reps bil- postural cues  Manual Therapy: STM to B lumbar paraspinals, R QL 01/19/22 Therapeutic Exercise: Nustep L5x631m Supine clams GTB 15 reps Supine march with GTB 15 reps Bridge with GTB 2x10- reminders needed to keep knees against TB Sidesteps with GTB 2x2516fonster walk 2x25 ft- cues for technique throughout exercise Bird dog 10 reps - difficulty maintaining balance Standing pallof press 10 reps bil Standing high row 15 reps GTB Standing extension with GTB 10 reps Childs pose x 30 sec hold  Manual Therapy: STM to B lumbar paraspinals, R QL  01/16/2022 Therapeutic Exercise: to improve  strength and mobility.  Demo, verbal and tactile cues throughout for technique. Gait x 1500' 6 minutes Squats 2 x 10  Side steps RTB 2 x 20' bil Monster walks RTB 2 x 20' - cues needed throughout [place] Butteth RTB 2 x 20 Supine clams RTB 2 x 15 S/L clams RTB 2 x 10 bil  S/L reverse clams 2 x  10 bil  Prone leg extensions 2 x 10 bil  Bird dogs 2 x 10 bil - struggled with L leg/R arm extension  Child pose between sets of bird dogs to relax low back  Manual Therapy: to decrease muscle spasm and pain and improve mobility, pillow under abdomen STM bil lumbar paraspinals, bil gluteals , R UPA mobs T12-L5 grade 1-2, TPR R lumbar erector spinae, MFR bil QL  PATIENT EDUCATION:  Education details: continue HEP Person educated: Patient Education method: Explanation Education comprehension: verbalized understanding   HOME EXERCISE PROGRAM: Access Code: [email or web address]  ASSESSMENT:  CLINICAL IMPRESSION: Joyelle Siedlecki Work [name] has made good progress, reports 40% overall improvement in LBP.  She reports pain is not interfering with activities at home, and is not constant.  She has met or almost met all goals, with exception of LTG #2, but even when she reports LBP, she is able to do all exercise without complaint or guarding.  Reviewed HEP today, and updated focusing on exercises that she can complete independently with good form safely.  She is ready and agreeable to discharge today.     OBJECTIVE IMPAIRMENTS decreased activity tolerance, decreased strength, hypomobility, increased muscle spasms, impaired flexibility, postural dysfunction, and pain.   ACTIVITY LIMITATIONS cleaning and yard work.   PERSONAL FACTORS Behavior pattern and Time since onset of injury/illness/exacerbation are also affecting patient's functional outcome.    REHAB POTENTIAL: Excellent  CLINICAL DECISION MAKING: Stable/uncomplicated  EVALUATION COMPLEXITY: Low    GOALS: Goals reviewed with patient? Yes  SHORT TERM  GOALS: Target date: 12/28/2021   Patient will be independent with initial HEP.  Baseline:  Goal status: MET (01/19/22)   LONG TERM GOALS: Target date: 01/25/2022   Patient will be independent with advanced/ongoing HEP to improve outcomes and carryover.  Baseline:  Goal status: MET  2.  Patient will report 75% improvement in low back pain with ADLS to improve QOL.  Baseline:  Goal status: NOT MET -pt reports 40% improvement in LBP since starting PT   3.  Patient to demonstrate ability to achieve and maintain good spinal alignment/posturing and body mechanics needed for daily activities. Baseline:  Goal status: MET  4. Patient will demonstrate improved LE strength to 5/5 and improved core strength as demonstrated by her ability to stabilize with MMT of LEs. Baseline:  Goal status: MET  5.  Patient will report 48 on lumbar FOTO to demonstrate improved functional ability.  Baseline: 58 Goal status: PARTIALLY MET  01/25/22- 65%   PLAN: PT FREQUENCY: 2x/week  PT DURATION: 6 weeks  PLANNED INTERVENTIONS: Therapeutic exercises, Therapeutic activity, Neuromuscular re-education, Patient/Family education, Dry Needling, Electrical stimulation, Spinal mobilization, Cryotherapy, Moist heat, Taping, Traction, and Manual therapy.  PLAN FOR NEXT SESSION: discharge to [place], PT 01/25/2022, 4:52 PM

## 2022-01-30 NOTE — Telephone Encounter (Signed)
LMTCB

## 2022-02-01 ENCOUNTER — Encounter: Payer: Self-pay | Admitting: Internal Medicine

## 2022-03-04 NOTE — Telephone Encounter (Signed)
FYI  Pt is > 60 days PAST DUE  If you would like for pt to continue with Prolia therapy, please have clinical staff reach out to pt for scheduling and to explain to importance of receiving Prolia injections every 6 months as abrupt cessation of Prolia raises risk of osteoporotic fracture.    "Discontinuation of Dmab is associated with a 3- to 5-fold higher risk for vertebral, major osteoporotic, and hip fractures [38,39]."   [email or web address]   Pt ready for scheduling on or after 12/20/21   Out-of-pocket cost due at time of visit: $301   Primary: [employer] Prolia co-insurance: 20% (approximately $276) Admin fee co-insurance: 20% (approximately $25)   Secondary: n/a Prolia co-insurance:  Admin fee co-insurance:    Deductible: does not apply   Prior Auth: APPROVED PA# [id number] Valid: 12/08/21-12/08/22   ** This summary of benefits is an estimation of the patient's out-of-pocket cost. Exact cost may vary based on individual plan coverage.

## 2022-03-06 NOTE — Telephone Encounter (Signed)
Patient has been scheduled for 03/14/22

## 2022-03-06 NOTE — Telephone Encounter (Signed)
Can you please call pt. - per Brandy's message below.

## 2022-03-14 ENCOUNTER — Ambulatory Visit (INDEPENDENT_AMBULATORY_CARE_PROVIDER_SITE_OTHER): Payer: Medicare HMO

## 2022-03-14 DIAGNOSIS — M810 Age-related osteoporosis without current pathological fracture: Secondary | ICD-10-CM | POA: Diagnosis not present

## 2022-03-14 MED ORDER — DENOSUMAB 60 MG/ML ~~LOC~~ SOSY
60.0000 mg | PREFILLED_SYRINGE | Freq: Once | SUBCUTANEOUS | Status: AC
  Administered 2022-03-14: 60 mg via SUBCUTANEOUS

## 2022-03-14 NOTE — Progress Notes (Addendum)
Patient verbally confirmed name, date of birth, and correct medication to be administered. Prolia injection administered and pt tolerated well.  

## 2022-03-15 ENCOUNTER — Encounter: Payer: Self-pay | Admitting: Internal Medicine

## 2022-03-20 ENCOUNTER — Encounter: Payer: Self-pay | Admitting: Internal Medicine

## 2022-03-20 ENCOUNTER — Ambulatory Visit (INDEPENDENT_AMBULATORY_CARE_PROVIDER_SITE_OTHER): Payer: Medicare HMO | Admitting: Internal Medicine

## 2022-03-20 VITALS — BP 125/76 | HR 75 | Temp 98.0°F | Resp 16 | Ht 60.6 in | Wt 89.0 lb

## 2022-03-20 DIAGNOSIS — R0989 Other specified symptoms and signs involving the circulatory and respiratory systems: Secondary | ICD-10-CM

## 2022-03-20 DIAGNOSIS — F0150 Vascular dementia without behavioral disturbance: Secondary | ICD-10-CM

## 2022-03-20 DIAGNOSIS — R7309 Other abnormal glucose: Secondary | ICD-10-CM

## 2022-03-20 DIAGNOSIS — E039 Hypothyroidism, unspecified: Secondary | ICD-10-CM

## 2022-03-20 DIAGNOSIS — G43809 Other migraine, not intractable, without status migrainosus: Secondary | ICD-10-CM

## 2022-03-20 DIAGNOSIS — K219 Gastro-esophageal reflux disease without esophagitis: Secondary | ICD-10-CM

## 2022-03-20 DIAGNOSIS — R69 Illness, unspecified: Secondary | ICD-10-CM | POA: Diagnosis not present

## 2022-03-20 DIAGNOSIS — Z79899 Other long term (current) drug therapy: Secondary | ICD-10-CM | POA: Diagnosis not present

## 2022-03-20 DIAGNOSIS — E559 Vitamin D deficiency, unspecified: Secondary | ICD-10-CM | POA: Diagnosis not present

## 2022-03-20 DIAGNOSIS — E782 Mixed hyperlipidemia: Secondary | ICD-10-CM | POA: Diagnosis not present

## 2022-03-20 NOTE — Progress Notes (Signed)
Future Appointments  Date Time Provider Department  03/20/2022  3:30 PM Unk Pinto, MD [facility]  08/08/2022                         cpe 10:00 AM Darrol Jump, NP [facility]  08/23/2022  2:00 PM Ward Givens, NP [facility]  11/28/2022  2:00 PM Philemon Kingdom, MD [facility]  12/18/2022                      wellness  2:00 PM Alycia Rossetti, NP [facility]    History of Present Illness:      This very nice 66 y.o. [name] presents for 66monthfollow up with hx/o labile HTN and chronic Vascular HA's,  HLD,  GERD, Hypothyroidism, Osteoporosis  and Vitamin D Deficiency.       Patient's husband reports patient is having daily midline upper abdominal  "pains"  which radiates to patient's back.  Denies heartburn, waterbrash /acid reflux, N/V, or affected by food intake.        Patient presents again with c/o poor short term memory recall and poor attention & focus. Scored 25/30 on MMSE in Mar 2022. Patient was evaluated by Dr YKrista Bluein Jan with a dx of Dementia.         Patient is followed expectantly for labile HTN & BP has been controlled at home. Today's BP is   at goal - 125/76 .  Patient has had no complaints of any cardiac type chest pain, palpitations, dyspnea / orthopnea / PND, dizziness, claudication, or dependent edema.       Hyperlipidemia is controlled with diet & Rosuvastatin. Patient denies myalgias or other med SE's. Last Lipids were at goal :  Lab Results  Component Value Date   CHOL 174 12/14/2021   HDL 65 12/14/2021   LDLCALC 83 12/14/2021   TRIG 159 (H) 12/14/2021   CHOLHDL 2.7 12/14/2021        Patient was dx'd Hypothyroid in 2014 and has been on thyroid replacement therapy since then .    Also, the patient has history of PreDiabetes and has had no symptoms of reactive hypoglycemia, diabetic polys, paresthesias or visual blurring.  Last A1c was at goal :  Lab Results  Component Value Date   HGBA1C 5.6 08/05/2021                                                         Further, the patient also has history of Vitamin D Deficiency and supplements vitamin D without any suspected side-effects. Last vitamin D was at goal :  Lab Results  Component Value Date   VD25OH 55 08/05/2021        Current Outpatient Medications:     amLODipine 5 MG tablet, TAKE ONE TABLET DAILY   Cholecalciferol 1000 u, Take 1 capsule daily   donepezil  10 MG tablet, Take 1 tablet  at bedtime   levothyroxine 25 MCG tablet, TAKE ONE TABLET DAILY    memantine 10 MG tablet, Take 1 tablet 2  times daily   mirtazapine  7.5 MG tablet, Take  1 tablet at Bedtime  rosuvastatin  20 MG tablet, TAKE ONE TABLET DAILY    traZODone  150 MG tablet, Take  1/2 to 1 tablet  1 hour  before Bedtime  as needed for Sleep   Allergies  Allergen Reactions   Aleve [Naproxen Sodium] Rash   Lipitor [Atorvastatin] Other (See Comments)   Niacin And Related Anaphylaxis   Omnaris [Ciclesonide] Other (See Comments)    headache   Ceftin [Cefuroxime Axetil] Rash   Levaquin [Levofloxacin In D5w] Muscle cramps   Simvastatin    Zetia [Ezetimibe]      PMHx:   Past Medical History:  Diagnosis Date   Carpal tunnel syndrome on right    History of concussion    1972, 1977, 1983   Hyperlipidemia    Hypertension    Migraines    pain clinic [facility]   Osteoporosis    Immunization History  Administered Date(s) Administered   Moderna Sars-Covid-2 Vacc 09/12/2019, 10/10/2019, 05/22/2020   PPD Test 09/08/2013   Pneumococcal - 23 08/01/2007   Tdap 07/31/2011   Zoster, Live 11/21/2016     Past Surgical History:  Procedure Laterality Date   abdominal wall tear     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 Eyes: Denies redness, blurred vision, diplopia, discharge, itchy, watery eyes.  ENT: Denies discharge, congestion, post nasal drip, epistaxis, sore throat, earache, hearing loss, dental pain, tinnitus, vertigo, sinus pain, snoring.  CV: Denies chest pain, palpitations, irregular heartbeat, syncope, dyspnea, diaphoresis, orthopnea, PND, claudication or edema. Respiratory: denies cough, dyspnea, DOE, pleurisy, hoarseness, laryngitis, wheezing.  Gastrointestinal: Denies dysphagia, odynophagia, heartburn, reflux, water brash, abdominal pain or cramps, nausea, vomiting, bloating, diarrhea, constipation, hematemesis, melena, hematochezia  or hemorrhoids. Genitourinary: Denies dysuria, frequency, urgency, nocturia, hesitancy, discharge, hematuria or flank pain. Musculoskeletal: Denies arthralgias, myalgias, stiffness, jt. swelling, pain, limping or strain/sprain.  Skin: Denies pruritus, rash, hives, warts, acne, eczema or change in skin lesion(s). Neuro: No weakness, tremor, incoordination, spasms, paresthesia or pain. Psychiatric: Denies confusion, memory loss or sensory loss. Endo: Denies change in weight, skin or hair change.  Heme/Lymph: No excessive bleeding, bruising or enlarged lymph nodes.  Physical Exam  BP 125/76   Pulse 75   Temp 98 F (36.7 C)   Resp 16   Ht 5' 0.6" (1.539 m)   Wt 89 lb (40.4 kg)   SpO2 99%   BMI 17.04 kg/m   Appears  well nourished, well groomed  and in no distress.  Eyes: PERRLA, EOMs, conjunctiva no swelling or erythema. Sinuses: No frontal/maxillary tenderness ENT/Mouth: EAC's clear, TM's nl w/o erythema, bulging. Nares clear w/o erythema, swelling, exudates. Oropharynx clear without erythema or exudates. Oral hygiene is good. Tongue normal, non obstructing. Hearing intact.  Neck: Supple. Thyroid not palpable. Car 2+/2+ without bruits, nodes or JVD. Chest: Respirations nl with BS clear  & equal w/o rales, rhonchi, wheezing or stridor.  Cor: Heart sounds normal w/ regular rate and rhythm without sig. murmurs, gallops, clicks or rubs. Peripheral pulses normal and equal  without edema.  Abdomen: Soft & bowel sounds normal. Non-tender w/o guarding, rebound, hernias, masses or organomegaly.  Lymphatics: Unremarkable.  Musculoskeletal:  (+)  slight low para lumbar para spinous tenderness.  Full ROM all peripheral extremities, joint stability, 5/5 strength and normal gait.  Skin: Warm, dry without exposed rashes, lesions or ecchymosis apparent.  Neuro: Cranial nerves intact, reflexes equal bilaterally. Sensory-motor testing grossly intact. Tendon reflexes grossly intact.  Pysch: Alert & oriented x 3.  Insight and judgement nl & appropriate. Poor ST recall.   Assessment and Plan:   1. Labile hypertension  - Continue medication, monitor blood pressure at home.  - Continue DASH diet.  Reminder to go to the ER if any CP,  SOB, nausea, dizziness, severe HA, changes vision/speech.    - CBC with Differential/Platelet - COMPLETE METABOLIC PANEL WITH GFR - Magnesium - TSH  2. Hyperlipidemia, mixed  - Continue diet/meds, exercise,& lifestyle modifications.  - Continue monitor periodic cholesterol/liver & renal functions     - Lipid panel - TSH  3. Abnormal glucose  - Continue diet, exercise  - Lifestyle modifications.  - Monitor appropriate labs   - Hemoglobin A1c - Insulin, random  4. Vitamin D deficiency  - Continue supplementation    - VITAMIN D 25 Hydroxy   5. Gastroesophageal reflux disease   6. Other migraine without status migrainosus, not intractable   7. Hypothyroidism  - TSH  8. Vascular dementia  ([place])  - TSH  9. Medication management  - CBC with Differential/Platelet - COMPLETE METABOLIC PANEL WITH GFR - Magnesium - Lipid panel - TSH - Hemoglobin A1c - Insulin, random - VITAMIN D 25 Hydroxy        Discussed  regular exercise, BP  monitoring, weight control to achieve/maintain BMI less than 25 and discussed med and SE's. Recommended labs to assess and monitor clinical status with further disposition pending results of labs.  I discussed the assessment and treatment plan with the patient. The patient was provided an opportunity to ask questions and all were answered. The patient agreed with the plan and demonstrated an understanding of the instructions.  I provided over 30 minutes of exam, counseling, chart review and  complex critical decision making.       The patient was advised to call back or seek an in-person evaluation if the symptoms worsen or if the condition fails to improve as anticipated.   Kirtland Bouchard, MD

## 2022-03-20 NOTE — Patient Instructions (Signed)

## 2022-03-21 LAB — COMPLETE METABOLIC PANEL WITH GFR
AG Ratio: 1.9 (calc) (ref 1.0–2.5)
ALT: 15 U/L (ref 6–29)
AST: 28 U/L (ref 10–35)
Albumin: 4.6 g/dL (ref 3.6–5.1)
Alkaline phosphatase (APISO): 23 U/L — ABNORMAL LOW (ref 37–153)
BUN/Creatinine Ratio: 16 (calc) (ref 6–22)
BUN: 17 mg/dL (ref 7–25)
CO2: 26 mmol/L (ref 20–32)
Calcium: 10 mg/dL (ref 8.6–10.4)
Chloride: 108 mmol/L (ref 98–110)
Creat: 1.08 mg/dL — ABNORMAL HIGH (ref 0.50–1.05)
Globulin: 2.4 g/dL (calc) (ref 1.9–3.7)
Glucose, Bld: 86 mg/dL (ref 65–99)
Potassium: 4.4 mmol/L (ref 3.5–5.3)
Sodium: 143 mmol/L (ref 135–146)
Total Bilirubin: 0.3 mg/dL (ref 0.2–1.2)
Total Protein: 7 g/dL (ref 6.1–8.1)
eGFR: 56 mL/min/{1.73_m2} — ABNORMAL LOW (ref >=60)

## 2022-03-21 LAB — CBC WITH DIFFERENTIAL/PLATELET
Absolute Monocytes: 628 cells/uL (ref 200–950)
Basophils Absolute: 43 cells/uL (ref 0–200)
Basophils Relative: 0.5 %
Eosinophils Absolute: 69 cells/uL (ref 15–500)
Eosinophils Relative: 0.8 %
HCT: 40.3 % (ref 35.0–45.0)
Hemoglobin: 13.4 g/dL (ref 11.7–15.5)
Lymphs Abs: 2150 cells/uL (ref 850–3900)
MCH: 30.2 pg (ref 27.0–33.0)
MCHC: 33.3 g/dL (ref 32.0–36.0)
MCV: 91 fL (ref 80.0–100.0)
MPV: 10 fL (ref 7.5–12.5)
Monocytes Relative: 7.3 %
Neutro Abs: 5710 cells/uL (ref 1500–7800)
Neutrophils Relative %: 66.4 %
Platelets: 277 10*3/uL (ref 140–400)
RBC: 4.43 10*6/uL (ref 3.80–5.10)
RDW: 12.6 % (ref 11.0–15.0)
Total Lymphocyte: 25 %
WBC: 8.6 10*3/uL (ref 3.8–10.8)

## 2022-03-21 LAB — LIPID PANEL
Cholesterol: 188 mg/dL (ref <200)
HDL: 75 mg/dL (ref >=50)
LDL Cholesterol (Calc): 90 mg/dL (calc)
Non-HDL Cholesterol (Calc): 113 mg/dL (calc) (ref <130)
Total CHOL/HDL Ratio: 2.5 (calc) (ref <5.0)
Triglycerides: 124 mg/dL (ref <150)

## 2022-03-21 LAB — VITAMIN D 25 HYDROXY (VIT D DEFICIENCY, FRACTURES): Vit D, 25-Hydroxy: 44 ng/mL (ref 30–100)

## 2022-03-21 LAB — HEMOGLOBIN A1C
Hgb A1c MFr Bld: 5.2 % of total Hgb (ref <5.7)
Mean Plasma Glucose: 103 mg/dL
eAG (mmol/L): 5.7 mmol/L

## 2022-03-21 LAB — MAGNESIUM: Magnesium: 2.4 mg/dL (ref 1.5–2.5)

## 2022-03-21 LAB — INSULIN, RANDOM: Insulin: 6 u[IU]/mL

## 2022-03-21 LAB — TSH: TSH: 1.44 mIU/L (ref 0.40–4.50)

## 2022-03-21 NOTE — Progress Notes (Signed)
<><><><><><><><><><><><><><><><><><><><><><><><><><><><><><><><><> <><><><><><><><><><><><><><><><><><><><><><><><><><><><><><><><><> -   Test results slightly outside the reference range are not unusual. If there is anything important, I will review this with you,  otherwise it is considered normal test values.  If you have further questions,  please do not hesitate to contact me at the office or via My Chart.  <><><><><><><><><><><><><><><><><><><><><><><><><><><><><><><><><> <><><><><><><><><><><><><><><><><><><><><><><><><><><><><><><><><>  -  Total Chol = 188  and LDL = 90 - Both  Excellent   - Very low risk for Heart Attack  / Stroke <><><><><><><><><><><><><><><><><><><><><><><><><><><><><><><><><>  -  A1c - Normal - Great - No Diabetes  <><><><><><><><><><><><><><><><><><><><><><><><><><><><><><><><><>  -  Vitamin D = 44 - is very Low   - Vitamin D goal is between     70-100.  NOMVEHMCNOBSJGGEZMOQHUTMLYYTKPTWSFKCLEXNTZGYFVCBSWHQPRFFMBWGYKZLDJTTSVXBLTJQZESPQZRAQTMAUQJFHLKTGYBWLSLHTDSKAJGOTLXBWIOMBTDHRCBULAGTXMIWOEHOZYYQMGNOIBBCWUGQBVQXIHWTUUEKCMKL  - Please INCREASE  your Vitamin D 1,000 units up to 5,000 units EVERY day  !   KJZPHXTAVWPVXYIAXKPVVZSMOLMBEMLJQGBEEFEOFHQRFXJOITGPQDIYMEBRAXENMMHWKGSUPJSRPRXYVOPFYTWKMQKMMNOTRRNHAFBXUXYBFXOVANVBTYOMAYOKHTXHFSFSELTRVUYEBXIDHWYSHUOHFGBMSXJDBZMCEYEMVVKP  - It is very important as a natural anti-inflammatory and helping the  immune system protect against viral infections, like the Covid-19   helping hair, skin, and nails, as well as reducing stroke and  heart attack risk.   - It helps your bones and helps with mood.  - It also decreases numerous cancer risks so please  take it as directed.   - Low Vit D is associated with a 200-300% higher risk for  CANCER   and 200-300% higher risk for HEART   ATTACK  &  STROKE.    - It is also associated with higher death rate at younger ages,   autoimmune diseases like Rheumatoid  arthritis, Lupus,  Multiple Sclerosis.     - Also many other serious conditions, like depression, Alzheimer's  Dementia, infertility, muscle aches, fatigue, fibromyalgia   - just to name a few. <><><><><><><><><><><><><><><><><><><><><><><><><><><><><><><><><> <><><><><><><><><><><><><><><><><><><><><><><><><><><><><><><><><>  -  All Else - CBC - Kidneys - Electrolytes - Liver - Magnesium & Thyroid    - all  Normal / OK  <><><><><><><><><><><><><><><><><><><><><><><><><><><><><><><><><> <><><><><><><><><><><><><><><><><><><><><><><><><><><><><><><><><>

## 2022-03-23 ENCOUNTER — Ambulatory Visit: Payer: Medicare HMO | Admitting: Internal Medicine

## 2022-03-29 ENCOUNTER — Other Ambulatory Visit: Payer: Self-pay | Admitting: Internal Medicine

## 2022-03-29 ENCOUNTER — Encounter: Payer: Self-pay | Admitting: Internal Medicine

## 2022-03-29 MED ORDER — TIZANIDINE HCL 4 MG PO TABS: ORAL_TABLET | ORAL | 0 refills | Status: DC

## 2022-04-04 NOTE — Telephone Encounter (Signed)
Last Prolia inj 03/14/22 Next Prolia inj due 09/15/22 

## 2022-05-10 ENCOUNTER — Other Ambulatory Visit: Payer: Self-pay | Admitting: Internal Medicine

## 2022-05-11 ENCOUNTER — Other Ambulatory Visit: Payer: Self-pay | Admitting: Internal Medicine

## 2022-05-11 MED ORDER — TIZANIDINE HCL 4 MG PO TABS: ORAL_TABLET | ORAL | 1 refills | Status: DC

## 2022-06-28 ENCOUNTER — Encounter: Payer: Self-pay | Admitting: Gastroenterology

## 2022-07-18 ENCOUNTER — Other Ambulatory Visit: Payer: Self-pay | Admitting: Internal Medicine

## 2022-07-21 ENCOUNTER — Other Ambulatory Visit: Payer: Self-pay | Admitting: Nurse Practitioner

## 2022-07-21 DIAGNOSIS — E782 Mixed hyperlipidemia: Secondary | ICD-10-CM

## 2022-07-26 ENCOUNTER — Encounter: Payer: Medicare HMO | Admitting: Adult Health

## 2022-08-02 ENCOUNTER — Encounter: Payer: Medicare HMO | Admitting: Nurse Practitioner

## 2022-08-03 ENCOUNTER — Other Ambulatory Visit: Payer: Self-pay

## 2022-08-03 MED ORDER — TRAZODONE HCL 150 MG PO TABS: ORAL_TABLET | ORAL | 1 refills | Status: DC

## 2022-08-08 ENCOUNTER — Encounter: Payer: Self-pay | Admitting: Nurse Practitioner

## 2022-08-08 ENCOUNTER — Ambulatory Visit (INDEPENDENT_AMBULATORY_CARE_PROVIDER_SITE_OTHER): Payer: Medicare HMO | Admitting: Nurse Practitioner

## 2022-08-08 VITALS — BP 98/68 | HR 73 | Temp 97.3°F | Ht 61.0 in | Wt 95.6 lb

## 2022-08-08 DIAGNOSIS — Z01419 Encounter for gynecological examination (general) (routine) without abnormal findings: Secondary | ICD-10-CM | POA: Diagnosis not present

## 2022-08-08 DIAGNOSIS — E782 Mixed hyperlipidemia: Secondary | ICD-10-CM

## 2022-08-08 DIAGNOSIS — Z0001 Encounter for general adult medical examination with abnormal findings: Secondary | ICD-10-CM

## 2022-08-08 DIAGNOSIS — Z131 Encounter for screening for diabetes mellitus: Secondary | ICD-10-CM

## 2022-08-08 DIAGNOSIS — E559 Vitamin D deficiency, unspecified: Secondary | ICD-10-CM

## 2022-08-08 DIAGNOSIS — Z7689 Persons encountering health services in other specified circumstances: Secondary | ICD-10-CM | POA: Diagnosis not present

## 2022-08-08 DIAGNOSIS — Z1389 Encounter for screening for other disorder: Secondary | ICD-10-CM

## 2022-08-08 DIAGNOSIS — G43809 Other migraine, not intractable, without status migrainosus: Secondary | ICD-10-CM

## 2022-08-08 DIAGNOSIS — I10 Essential (primary) hypertension: Secondary | ICD-10-CM

## 2022-08-08 DIAGNOSIS — R4189 Other symptoms and signs involving cognitive functions and awareness: Secondary | ICD-10-CM

## 2022-08-08 DIAGNOSIS — K219 Gastro-esophageal reflux disease without esophagitis: Secondary | ICD-10-CM | POA: Diagnosis not present

## 2022-08-08 DIAGNOSIS — Z0000 Encounter for general adult medical examination without abnormal findings: Secondary | ICD-10-CM | POA: Diagnosis not present

## 2022-08-08 DIAGNOSIS — Z1231 Encounter for screening mammogram for malignant neoplasm of breast: Secondary | ICD-10-CM | POA: Diagnosis not present

## 2022-08-08 DIAGNOSIS — Z136 Encounter for screening for cardiovascular disorders: Secondary | ICD-10-CM

## 2022-08-08 DIAGNOSIS — E039 Hypothyroidism, unspecified: Secondary | ICD-10-CM | POA: Diagnosis not present

## 2022-08-08 DIAGNOSIS — M810 Age-related osteoporosis without current pathological fracture: Secondary | ICD-10-CM

## 2022-08-08 DIAGNOSIS — I4510 Unspecified right bundle-branch block: Secondary | ICD-10-CM

## 2022-08-08 DIAGNOSIS — Z79899 Other long term (current) drug therapy: Secondary | ICD-10-CM | POA: Diagnosis not present

## 2022-08-08 DIAGNOSIS — Z1329 Encounter for screening for other suspected endocrine disorder: Secondary | ICD-10-CM

## 2022-08-08 NOTE — Progress Notes (Signed)
Complete Physical  Assessment and Plan:  Isabel [name] was seen today for annual exam.  Diagnoses and all orders for this visit:  Encounter for Annual Physical Exam with abnormal findings Due annually  Health Maintenance reviewed Healthy lifestyle reviewed and goals set  Other osteoporosis without current pathological fracture Pursue a combination of weight-bearing exercises and strength training. Advised on fall prevention measures including proper lighting in all rooms, removal of area rugs and floor clutter, use of walking devices as deemed appropriate, avoidance of uneven walking surfaces. Smoking cessation and moderate alcohol consumption if applicable Consume 644 to 1000 IU of vitamin D daily with a goal vitamin D serum value of 30 ng/mL or higher. Aim for 1000 to 1200 mg of elemental calcium daily through supplements and/or dietary sources.   Hypothyroidism, unspecified type Continue Levothyroxine. Reminded to take on an empty stomach 30-24mns before food.  Stop any Biotin Supplement 48-72 hours before next TSH level to reduce the risk of falsely low TSH levels. Continue to monitor.     Mixed hyperlipidemia Discussed lifestyle modifications. Recommended diet heavy in fruits and veggies, omega 3's. Decrease consumption of animal meats, cheeses, and dairy products. Remain active and exercise as tolerated. Continue to monitor. Check lipids/TSH   Other migraine without status migrainosus, not intractable Resoloved    Hx of Prediabetes/screening diabetes Education: Reviewed 'ABCs' of diabetes management  Discussed goals to be met and/or maintained include A1C (<7) Blood pressure (<130/80) Cholesterol (LDL <70) Continue Eye Exam yearly  Continue Dental Exam Q6 mo Discussed dietary recommendations Discussed Physical Activity recommendations Check A1C   Vitamin D deficiency Continue supplement. Monitor levels  Gastroesophageal reflux disease, esophagitis presence not  specified No suspected reflux complications ([place]/stricture). Lifestyle modification:  wt loss, avoid meals 2-3h before bedtime. Consider eliminating food triggers:  chocolate, caffeine, EtOH, acid/spicy food.  Hypertension Discussed DASH (Dietary Approaches to Stop Hypertension) DASH diet is lower in sodium than a typical American diet. Cut back on foods that are high in saturated fat, cholesterol, and trans fats. Eat more whole-grain foods, fish, poultry, and nuts Remain active and exercise as tolerated daily.  Monitor BP at home-Call if greater than 130/80.  Check CMP/CBC  . RBBB on EKG IRBBB, monitor   Medication management All medications discussed and reviewed in full. All questions and concerns regarding medications addressed.     BMI <19 Continue Remeron  Weight gain encouraged; boost, ensure, high protein calorie diet reviewed and recommended Aim to get back up to baseline 100 lb   Cognitive impairement Poor short term memory and attention/calculation Dr. YKrista Bluefollowing Continue aricept/namenda Does have chronic microvascular changes - vascular health, exercise, mind diet, memory exercises reviewed Monitor  Screening for cardiovascular disease EKG  Screening for DM A1c  Screening for thyroid disorder TSH  Screening for proteinuria or hematuria  UA/Microalbumin     Orders Placed This Encounter  Procedures   CBC with Differential/Platelet   COMPLETE METABOLIC PANEL WITH GFR   Magnesium   Lipid panel   TSH   Hemoglobin A1c   Insulin, random   VITAMIN D 25 Hydroxy (Vit-D Deficiency, Fractures)   Urinalysis, Routine w reflex microscopic   Microalbumin / creatinine urine ratio   EKG 12-Lead    Notify office for further evaluation and treatment, questions or concerns if any reported s/s fail to improve.   The patient was advised to call back or seek an in-person evaluation if any symptoms worsen or if the condition fails to improve as  anticipated.  Further disposition pending results of labs. Discussed med's effects and SE's.    I discussed the assessment and treatment plan with the patient. The patient was provided an opportunity to ask questions and all were answered. The patient agreed with the plan and demonstrated an understanding of the instructions.  Discussed med's effects and SE's. Screening labs and tests as requested with regular follow-up as recommended.  I provided 35 minutes of face-to-face time during this encounter including counseling, chart review, and critical decision making was preformed.   Future Appointments  Date Time Provider [place]  08/23/2022  2:00 PM Ward Givens, NP [facility] None  11/28/2022  2:00 PM Philemon Kingdom, MD [facility] None  12/18/2022  2:00 PM Alycia Rossetti, NP [facility] None  08/09/2023 10:00 AM Darrol Jump, NP [facility] None     HPI  67 y.o. female  presents for a complete physical and follow up for has Hypertension; Hyperlipidemia; GERD (gastroesophageal reflux disease); Cervical spondylosis without myelopathy; Headache, migraine; Vitamin D deficiency; Medication management; Age-related osteoporosis without current pathological fracture; Hypothyroidism; Insomnia; Right bundle branch block (RBBB) on electrocardiogram (ECG); Adult BMI <19 kg/sq m; CKD (chronic kidney disease) stage 2, GFR 60-89 ml/min; Cognitive impairment; Dementia without behavioral disturbance, psychotic disturbance, mood disturbance, or anxiety (HCC); and Microalbuminuria on their problem list.   Isabel [name] is married, presents with Isabel [name] husband, no children, has 2 cats, 2 dogs, retired.   Isabel [name] follows with GYN every 2-3 years for PAP smears, Dr. Alan Ripper office, saw Isabel [name] 03/2020.     Isabel [name] has had migraines in the past, now since resolved.  Isabel [name] contributes this to age.     Significant memory changes in recent years, persistent poor short term recall over the pandemic and recently referred to est  with new neurologist [name], had MRI showing advanced for age microvascular changes, was dx with cognitive impairement and has been initiated on aricept/nemenda. Had MoCA of 12/30 on 05/04/2021 by [name], [place] of 24/30 today. Isabel [name] is continuing to follow as directed.    Isabel [name] is off of valium, doing well with trazodone 75 mg daily for sleep.   Isabel [name] is continuing to follow with [facility] for chronic back pain, getting injections/ablations. Recently had an ablation that made pain significantly worse, was very unactive and eating poorly for several months, lost a lot of weight.  Isabel [name] is continuing to take Remeron daily.    Isabel [name] has a diagnosis of GERD which is currently managed by lifestyle, rare breakthrough, takes tums occasionally.   BMI is Body mass index is 18.06 kg/m., Isabel [name] has been working on diet and exercise. Wt Readings from Last 3 Encounters:  08/08/22 95 lb 9.6 oz (43.4 kg)  03/20/22 89 lb (40.4 kg)  12/14/21 92 lb 6.4 oz (41.9 kg)   Isabel [name] blood pressure has been controlled at home, today their BP is BP: 98/68 Isabel [name] does workout in the form of walking. Isabel [name] denies chest pain, shortness of breath, dizziness.   Isabel [name] is on cholesterol medication (rosuvastatin 20 mg daily)  and denies myalgias. Isabel [name] cholesterol is not at goal. The cholesterol last visit was:   Lab Results  Component Value Date   CHOL 188 03/20/2022   HDL 75 03/20/2022   LDLCALC 90 03/20/2022   TRIG 124 03/20/2022   CHOLHDL 2.5 03/20/2022   Isabel [name] has been working on diet and exercise for hx of prediabetes, Isabel [name] is not on bASA, Isabel [name] is not on ACE/ARB and denies nausea, polydipsia and polyuria. Last A1C in  the office was:  Lab Results  Component Value Date   HGBA1C 5.2 03/20/2022   Isabel [name] is on thyroid medication. Isabel [name] medication was not changed last visit.  Taking 25 mcg daily.  Lab Results  Component Value Date   TSH 1.44 03/20/2022   Last GFR: Lab Results  Component Value Date   GFRNONAA 67 02/02/2021   Patient is on Vitamin D  supplement, now taking 1000 mcg, was recommende dreduced dose by Dr. Cruzita Lederer. Isabel [name] manages osteoporosis, getting prolia injections. Last DEXA was 2019, Dr. Darnell Level has ordered and planning to get a follow up.  Lab Results  Component Value Date   VD25OH 44 03/20/2022     BP Readings from Last 3 Encounters:  08/08/22 98/68  03/20/22 125/76  12/14/21 110/78     Current Medications:  Current Outpatient Medications on File Prior to Visit  Medication Sig Dispense Refill   amLODipine (NORVASC) 5 MG tablet TAKE ONE TABLET BY MOUTH ONE TIME DAILY 90 tablet 3   Cholecalciferol 25 MCG (1000 UT) capsule Take 1 capsule (1,000 Units total) by mouth daily.     donepezil (ARICEPT) 10 MG tablet Take 1 tablet (10 mg total) by mouth at bedtime. 90 tablet 4   levothyroxine (SYNTHROID) 25 MCG tablet TAKE ONE TABLET BY MOUTH ONE TIME DAILY ON AN EMPTY STOMACH WITH ONLY WATER FOR 30 MINUTES AND NO ANTACID MEDS, CALCIUM OR MAGNESIUM FOR 4 HOURS AND AVOID BIOTIN. 90 tablet 3   memantine (NAMENDA) 10 MG tablet Take 1 tablet (10 mg total) by mouth 2 (two) times daily. 180 tablet 4   mirtazapine (REMERON) 7.5 MG tablet Take  1 tablet at Bedtime                                                               /                                        TAKE                         BY                       MOUTH 90 tablet 3   rosuvastatin (CRESTOR) 20 MG tablet TAKE ONE TABLET BY MOUTH ONE TIME DAILY FOR CHOLESTEROL 90 tablet 1   tiZANidine (ZANAFLEX) 4 MG tablet Take 1/2 to 1 tablet 2 to 3 x /day as needed for Back Pain                                        /                              TAKE                                            BY  MOUTH 90 tablet 1   traZODone (DESYREL) 150 MG tablet Take  1/2 to 1 tablet  1 hour  before Bedtime  as needed for Sleep 90 tablet 1   No current facility-administered medications on file prior to visit.   Allergies:  Allergies  Allergen Reactions   Aleve  [Naproxen Sodium] Rash        Lipitor [Atorvastatin] Other (See Comments)   Niacin And Related Anaphylaxis   Omnaris [Ciclesonide] Other (See Comments)    headache   Ceftin [Cefuroxime Axetil] Rash   Other Other (See Comments)   Levaquin [Levofloxacin In D5w]     Muscle cramps   Simvastatin    Zetia [Ezetimibe]    Medical History:  Isabel [name] has Hypertension; Hyperlipidemia; GERD (gastroesophageal reflux disease); Cervical spondylosis without myelopathy; Headache, migraine; Vitamin D deficiency; Medication management; Age-related osteoporosis without current pathological fracture; Hypothyroidism; Insomnia; Right bundle branch block (RBBB) on electrocardiogram (ECG); Adult BMI <19 kg/sq m; CKD (chronic kidney disease) stage 2, GFR 60-89 ml/min; Cognitive impairment; Dementia without behavioral disturbance, psychotic disturbance, mood disturbance, or anxiety ([place]); and Microalbuminuria on their problem list. Health Maintenance:   Immunization History  Administered Date(s) Administered   Covid-19, Mrna,Vaccine(Spikevax)7yr and older 05/24/2022   Fluad Quad(high Dose 65+) 05/24/2022   Influenza, High Dose Seasonal PF 08/05/2021   Moderna Covid-19 Vaccine Bivalent Booster 136yr& up 04/06/2021   Moderna SARS-COV2 Booster Vaccination 10/30/2020   Moderna Sars-Covid-2 Vaccination 09/12/2019, 10/10/2019, 05/22/2020   PNEUMOCOCCAL CONJUGATE-20 12/14/2021   PPD Test 09/08/2013   Pneumococcal-Unspecified 08/01/2007   Tdap 07/31/2011   Zoster, Live 11/21/2016    Tetanus: 2012, will boost PRN Pneumovax: 2009 Prevnar 20: Next visit Flu vaccine: 04/2022 Zostavax: 2018 Covid 19: 2/2, 2021, moderna + boosted last 03/2021, 04/2022 Booster   LMP: Post-Menopausal Pap: last 03/2020, Dr. RoHarrington ChallengerGM: 04/02/2020 by GYN, call to schedule  DEXA: 2019 ,has upcoming appointment with Dr. GhCruzita Ledererolonoscopy: Due 06/2022 per Dr. StFuller PlanLast Dental Exam: 2022, encouraged to follow up Last Eye Exam:  11/2021  updated with glasses   Patient Care Team: McUnk PintoMD as PCP - General (Internal Medicine) StLadene ArtistMD as Consulting Physician (Gastroenterology) RoFay RecordsMD as Consulting Physician (Cardiology) NiJessy OtoMD (Inactive) as Consulting Physician (Orthopedic Surgery) YoDeneise LeverMD as Consulting Physician (Pulmonary Disease) HaAmil AmenMD as Referring Physician (Neurology) O'Dorene ArMD (Pain Medicine)  Surgical History:  Isabel [name] has a past surgical history that includes abdominal wall tear; Mouth surgery; fractured right wrist; Wrist surgery (Right, 1973); and LEEP. Family History:  Herfamily history includes Alzheimer's disease in Isabel [name] father; Depression in Isabel [name] mother; Hyperlipidemia in Isabel [name] father; Hypertension in Isabel [name] father; Migraines in Isabel [name] brother, maternal grandmother, mother, and sister; Stroke in Isabel [name] mother. Social History:  Isabel [name] reports that Isabel [name] quit smoking about 28 years ago. Isabel [name] smoking use included cigarettes. Isabel [name] started smoking about 48 years ago. Isabel [name] has a 19.00 pack-year smoking history. Isabel [name] has never used smokeless tobacco. Isabel [name] reports that Isabel [name] does not drink alcohol and does not use drugs.  Review of Systems: Review of Systems  Constitutional:  Negative for malaise/fatigue and weight loss.  HENT:  Negative for hearing loss and tinnitus.   Eyes:  Negative for blurred vision and double vision.  Respiratory:  Negative for cough, sputum production, shortness of breath and wheezing.   Cardiovascular:  Negative for chest pain, palpitations, orthopnea, claudication, leg swelling and PND.  Gastrointestinal:  Negative for abdominal pain, blood in stool,  constipation, diarrhea, heartburn, melena, nausea and vomiting.  Genitourinary: Negative.   Musculoskeletal:  Positive for back pain. Negative for falls, joint pain and myalgias.  Skin:  Negative for rash.  Neurological:  Negative for dizziness, tingling, sensory change, weakness and  headaches (none in over 6 months).  Endo/Heme/Allergies:  Negative for polydipsia.  Psychiatric/Behavioral:  Positive for memory loss (memory changes, poor short term memory). Negative for depression, substance abuse and suicidal ideas. The patient is not nervous/anxious and does not have insomnia.   All other systems reviewed and are negative.   Physical Exam: Estimated body mass index is 18.06 kg/m as calculated from the following:   Height as of this encounter: '5\' 1"'$  (1.549 m).   Weight as of this encounter: 95 lb 9.6 oz (43.4 kg). BP 98/68   Pulse 73   Temp (!) 97.3 F (36.3 C)   Ht '5\' 1"'$  (1.549 m)   Wt 95 lb 9.6 oz (43.4 kg)   SpO2 97%   BMI 18.06 kg/m  General Appearance: Well nourished, in no apparent distress.  Eyes: PERRLA, EOMs, conjunctiva no swelling or erythema Sinuses: No Frontal/maxillary tenderness  ENT/Mouth: Ext aud canals clear, normal light reflex with TMs without erythema, bulging. Good dentition. No erythema, swelling, or exudate on post pharynx. Tonsils not swollen or erythematous. Hearing normal.   Neck: Supple, thyroid normal. No bruits  Respiratory: Respiratory effort normal, BS equal bilaterally without rales, rhonchi, wheezing or stridor.  Cardio: RRR without murmurs, rubs or gallops. Brisk peripheral pulses without edema.  Chest: symmetric, with normal excursions and percussion.  Breasts: defer to GYN Abdomen: Soft, nontender, no guarding, rebound, hernias, masses, or organomegaly.  Lymphatics: Non tender without lymphadenopathy.  Genitourinary: defer to GYN Musculoskeletal: Full ROM all peripheral extremities,5/5 strength, and normal gait. Pain with palpation to cervical and lumbar spine. Skin: Warm, dry without rashes, lesions, ecchymosis. Neuro: Cranial nerves intact, reflexes equal bilaterally. Normal muscle tone, no cerebellar symptoms. Sensation intact. Poor short term memory, poor attention, MMSE 24/30  Psych: Awake and oriented X 3, normal  affect, Insight and Judgment appropriate.      08/05/2021   10:54 AM 10/27/2020    4:37 PM  MMSE - Mini Mental State Exam  Orientation to time 4 5  Orientation to Place 5 5  Registration 3 3  Attention/ Calculation 2 2  Recall 0 1  Language- name 2 objects 2 2  Language- repeat 1 1  Language- follow 3 step command 3 3  Language- read & follow direction 1 1  Write a sentence 1 1  Copy design 1 1  Total score 23 25    EKG: Sinus rhythm, IRBBB     Kano Heckmann, NP-C 10:16 AM [facility]

## 2022-08-08 NOTE — Patient Instructions (Signed)

## 2022-08-09 LAB — URINALYSIS, ROUTINE W REFLEX MICROSCOPIC
Bacteria, UA: NONE SEEN /HPF
Bilirubin Urine: NEGATIVE
Glucose, UA: NEGATIVE
Hgb urine dipstick: NEGATIVE
Ketones, ur: NEGATIVE
Leukocytes,Ua: NEGATIVE
Nitrite: NEGATIVE
Specific Gravity, Urine: 1.014 (ref 1.001–1.035)
Squamous Epithelial / HPF: NONE SEEN /HPF (ref <=5)
pH: 7.5 (ref 5.0–8.0)

## 2022-08-09 LAB — LIPID PANEL
Cholesterol: 209 mg/dL — ABNORMAL HIGH (ref <200)
HDL: 108 mg/dL (ref >=50)
LDL Cholesterol (Calc): 82 mg/dL (calc)
Non-HDL Cholesterol (Calc): 101 mg/dL (calc) (ref <130)
Total CHOL/HDL Ratio: 1.9 (calc) (ref <5.0)
Triglycerides: 91 mg/dL (ref <150)

## 2022-08-09 LAB — COMPLETE METABOLIC PANEL WITH GFR
AG Ratio: 1.8 (calc) (ref 1.0–2.5)
ALT: 14 U/L (ref 6–29)
AST: 26 U/L (ref 10–35)
Albumin: 4.8 g/dL (ref 3.6–5.1)
Alkaline phosphatase (APISO): 26 U/L — ABNORMAL LOW (ref 37–153)
BUN/Creatinine Ratio: 15 (calc) (ref 6–22)
BUN: 17 mg/dL (ref 7–25)
CO2: 29 mmol/L (ref 20–32)
Calcium: 9.9 mg/dL (ref 8.6–10.4)
Chloride: 102 mmol/L (ref 98–110)
Creat: 1.14 mg/dL — ABNORMAL HIGH (ref 0.50–1.05)
Globulin: 2.7 g/dL (calc) (ref 1.9–3.7)
Glucose, Bld: 99 mg/dL (ref 65–99)
Potassium: 3.7 mmol/L (ref 3.5–5.3)
Sodium: 143 mmol/L (ref 135–146)
Total Bilirubin: 0.4 mg/dL (ref 0.2–1.2)
Total Protein: 7.5 g/dL (ref 6.1–8.1)
eGFR: 53 mL/min/{1.73_m2} — ABNORMAL LOW (ref >=60)

## 2022-08-09 LAB — HEMOGLOBIN A1C
Hgb A1c MFr Bld: 5.4 % of total Hgb (ref <5.7)
Mean Plasma Glucose: 108 mg/dL
eAG (mmol/L): 6 mmol/L

## 2022-08-09 LAB — CBC WITH DIFFERENTIAL/PLATELET
Absolute Monocytes: 806 cells/uL (ref 200–950)
Basophils Absolute: 38 cells/uL (ref 0–200)
Basophils Relative: 0.4 %
Eosinophils Absolute: 58 cells/uL (ref 15–500)
Eosinophils Relative: 0.6 %
HCT: 41.6 % (ref 35.0–45.0)
Hemoglobin: 13.7 g/dL (ref 11.7–15.5)
Lymphs Abs: 2064 cells/uL (ref 850–3900)
MCH: 29.4 pg (ref 27.0–33.0)
MCHC: 32.9 g/dL (ref 32.0–36.0)
MCV: 89.3 fL (ref 80.0–100.0)
MPV: 10.3 fL (ref 7.5–12.5)
Monocytes Relative: 8.4 %
Neutro Abs: 6634 cells/uL (ref 1500–7800)
Neutrophils Relative %: 69.1 %
Platelets: 275 10*3/uL (ref 140–400)
RBC: 4.66 10*6/uL (ref 3.80–5.10)
RDW: 12.4 % (ref 11.0–15.0)
Total Lymphocyte: 21.5 %
WBC: 9.6 10*3/uL (ref 3.8–10.8)

## 2022-08-09 LAB — MICROALBUMIN / CREATININE URINE RATIO
Creatinine, Urine: 116 mg/dL (ref 20–275)
Microalb Creat Ratio: 73 mcg/mg creat — ABNORMAL HIGH (ref <30)
Microalb, Ur: 8.5 mg/dL

## 2022-08-09 LAB — VITAMIN D 25 HYDROXY (VIT D DEFICIENCY, FRACTURES): Vit D, 25-Hydroxy: 47 ng/mL (ref 30–100)

## 2022-08-09 LAB — MICROSCOPIC MESSAGE

## 2022-08-09 LAB — INSULIN, RANDOM: Insulin: 9.1 u[IU]/mL

## 2022-08-09 LAB — TSH: TSH: 3.05 mIU/L (ref 0.40–4.50)

## 2022-08-09 LAB — MAGNESIUM: Magnesium: 2.3 mg/dL (ref 1.5–2.5)

## 2022-08-17 NOTE — Telephone Encounter (Signed)
Prolia VOB initiated via [email or web address]  Last Prolia inj 03/14/22 Next Prolia inj due 09/15/22

## 2022-08-20 ENCOUNTER — Other Ambulatory Visit: Payer: Self-pay | Admitting: Nurse Practitioner

## 2022-08-20 DIAGNOSIS — E039 Hypothyroidism, unspecified: Secondary | ICD-10-CM

## 2022-08-23 ENCOUNTER — Encounter: Payer: Self-pay | Admitting: Adult Health

## 2022-08-23 ENCOUNTER — Ambulatory Visit: Payer: Medicare HMO | Admitting: Adult Health

## 2022-08-23 VITALS — BP 166/88 | HR 83 | Ht 61.0 in | Wt 98.2 lb

## 2022-08-23 DIAGNOSIS — F0390 Unspecified dementia without behavioral disturbance: Secondary | ICD-10-CM

## 2022-08-23 DIAGNOSIS — R69 Illness, unspecified: Secondary | ICD-10-CM | POA: Diagnosis not present
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
## 2022-08-28 NOTE — Telephone Encounter (Signed)
Pt ready for scheduling on or after 09/15/22  Out-of-pocket cost due at time of visit: $327  Primary: [employer] Medicare Adv PPO Prolia co-insurance: 20% (approximately $302) Admin fee co-insurance: 20% (approximately $25)  Deductible: does not apply  Prior Auth: APPROVED PA# [id number] Valid: 12/08/21-12/08/22  Secondary: n/a Prolia co-insurance:  Admin fee co-insurance:   Deductible: n/a  Prior Auth: n/a PA# Valid:   ** This summary of benefits is an estimation of the patient's out-of-pocket cost. Exact cost may vary based on individual plan coverage.

## 2022-09-09 ENCOUNTER — Other Ambulatory Visit: Payer: Self-pay | Admitting: Nurse Practitioner

## 2022-09-09 DIAGNOSIS — E782 Mixed hyperlipidemia: Secondary | ICD-10-CM

## 2022-09-12 ENCOUNTER — Encounter: Payer: Self-pay | Admitting: Nurse Practitioner

## 2022-09-12 ENCOUNTER — Encounter: Payer: Self-pay | Admitting: Adult Health

## 2022-09-12 DIAGNOSIS — E782 Mixed hyperlipidemia: Secondary | ICD-10-CM

## 2022-09-12 MED ORDER — ROSUVASTATIN CALCIUM 20 MG PO TABS: ORAL_TABLET | ORAL | 1 refills | Status: DC

## 2022-09-13 MED ORDER — MEMANTINE HCL 10 MG PO TABS: 10.0000 mg | ORAL_TABLET | Freq: Two times a day (BID) | ORAL | 3 refills | Status: DC

## 2022-09-13 NOTE — Telephone Encounter (Signed)
Last visit: 08/23/22 w/ Isabel Blossom NP Memantine 10 mg BID refilled to [facility] for 1 year.

## 2022-10-04 ENCOUNTER — Other Ambulatory Visit: Payer: Self-pay | Admitting: Internal Medicine

## 2022-10-07 ENCOUNTER — Other Ambulatory Visit: Payer: Self-pay | Admitting: Internal Medicine

## 2022-10-07 DIAGNOSIS — G43709 Chronic migraine without aura, not intractable, without status migrainosus: Secondary | ICD-10-CM

## 2022-10-07 DIAGNOSIS — I10 Essential (primary) hypertension: Secondary | ICD-10-CM

## 2022-10-07 MED ORDER — AMLODIPINE BESYLATE 5 MG PO TABS: ORAL_TABLET | ORAL | 3 refills | Status: DC

## 2022-10-11 ENCOUNTER — Telehealth: Payer: Self-pay | Admitting: Internal Medicine

## 2022-10-11 NOTE — Progress Notes (Signed)
  Chronic Care Management   Outreach Note  10/11/2022 Name: Isabel [name] Work [name] MRN: [id number] DOB: 06-22-1956  Referred by: Unk Pinto, MD Reason for referral : No chief complaint on file.   An unsuccessful telephone outreach was attempted today. The patient was referred to the pharmacist for assistance with care management and care coordination.   Follow Up Plan:   Tatjana Dellinger Upstream Scheduler

## 2022-10-16 ENCOUNTER — Telehealth: Payer: Self-pay | Admitting: Internal Medicine

## 2022-10-16 NOTE — Progress Notes (Signed)
  Chronic Care Management   Note  10/16/2022 Name: Isabel [name] Work [name] MRN: [id number] DOB: 06-26-1956  Santa [place]era Work [name] is a 67 y.o. year old female who is a primary care patient of Unk Pinto, MD. I reached out to [place] by phone today in response to a referral sent by Ms. Santa Genera Work Margolin's PCP, Unk Pinto, MD.   Ms. Work [name] was given information about Chronic Care Management services today including:  CCM service includes personalized support from designated clinical staff supervised by her physician, including individualized plan of care and coordination with other care providers 24/7 contact phone numbers for assistance for urgent and routine care needs. Service will only be billed when office clinical staff spend 20 minutes or more in a month to coordinate care. Only one practitioner may furnish and bill the service in a calendar month. The patient may stop CCM services at any time (effective at the end of the month) by phone call to the office staff.   BOB MARGOLIN/SPOUSE  verbally agreed to assistance and services provided by embedded care coordination/care management team today.  Follow up plan:   Tatjana Secretary/administrator

## 2022-10-23 ENCOUNTER — Other Ambulatory Visit: Payer: Self-pay

## 2022-10-23 MED ORDER — DONEPEZIL HCL 10 MG PO TABS: 10.0000 mg | ORAL_TABLET | Freq: Every day | ORAL | 4 refills | Status: DC

## 2022-10-23 NOTE — Telephone Encounter (Signed)
FYI pt is > 30 days past due for Prolia inj.  Last Prolia inj 03/14/22   If you would like for pt to continue with Prolia therapy, please have clinical staff reach out to pt for scheduling and to explain to importance of receiving Prolia injections every 6 months as abrupt cessation of Prolia raises risk of osteoporotic fracture.    "Discontinuation of Dmab is associated with a 3- to 5-fold higher risk for vertebral, major osteoporotic, and hip fractures [38,39]."   [email or web address]

## 2022-10-25 NOTE — Telephone Encounter (Signed)
Notified pt husband regarding prolia shot w/ out-of-pocket cost $327.00. Pt husband will discuss the prolia  on 11/28/22 appt.

## 2022-11-09 ENCOUNTER — Telehealth: Payer: Self-pay | Admitting: Pharmacist

## 2022-11-09 NOTE — Progress Notes (Signed)
HC chart prep started. Reviewed office visits, specialist visit, hospital visits, medications, adherence and labs.[facility] to complete precall.  Total time ( )  11/13/2022- Spoke with patients husband and completed pre-call for visit on 11/14/22.  Total time (5 mins)

## 2022-11-14 ENCOUNTER — Ambulatory Visit: Payer: Medicare HMO | Admitting: Pharmacist

## 2022-11-14 DIAGNOSIS — R4189 Other symptoms and signs involving cognitive functions and awareness: Secondary | ICD-10-CM

## 2022-11-14 DIAGNOSIS — E559 Vitamin D deficiency, unspecified: Secondary | ICD-10-CM

## 2022-11-14 DIAGNOSIS — I10 Essential (primary) hypertension: Secondary | ICD-10-CM

## 2022-11-14 DIAGNOSIS — E039 Hypothyroidism, unspecified: Secondary | ICD-10-CM

## 2022-11-14 DIAGNOSIS — Z79899 Other long term (current) drug therapy: Secondary | ICD-10-CM

## 2022-11-14 DIAGNOSIS — E782 Mixed hyperlipidemia: Secondary | ICD-10-CM

## 2022-11-14 DIAGNOSIS — N182 Chronic kidney disease, stage 2 (mild): Secondary | ICD-10-CM

## 2022-11-14 DIAGNOSIS — M810 Age-related osteoporosis without current pathological fracture: Secondary | ICD-10-CM

## 2022-11-14 NOTE — Progress Notes (Signed)
Initial Pharmacist Visit (CCM)  WORK [name],Isabel  67 years, Female  DOB: 08-Sep-1955  M:   __________________________________________________ Summary for PCP:  1. Recommended Tdap, RSV, Covid booster vaccines at local pharmacy. 2. Mammogram/Colonoscopy overdue; pt recalls receiving mammogram recently, per chart last documented was from 2021. 3. Short term memory continues to be a concern- sees neurology for follow up in August.  Visit Details Patient scheduled for CCM visit with the clinical pharmacist.  Patient is referred for CCM by their PCP and Clinical Pharmacist is under general PCP supervision.: At least 2 of these conditions are expected to last 12 months or longer and patient is at significant risk for acute exacerbations and/or functional decline.  Patient has consented to participation in CCM program. Visit Type: Phone Call Date of Upcoming Visit: 11/14/2022  Patient Chart Prep [facility]) Chronic Conditions Patient's Chronic Conditions: Gastroesophageal Reflux Disease (GERD), Hypertension (HTN), Hypothyroidism, Dementia, Osteopenia or Osteoporosis, Chronic Kidney Disease (CKD), Hyperlipidemia/Dyslipidemia (HLD), Insomnia, Other List Other Conditions (separated by comma): Vitamin D deficiency  Doctor and Hospital Visits Were there PCP Visits in last 6 months?: Yes PCP Visits details: 01/09/24Lawernce Ion (PCP office)- AWV, no med changes. Were there Specialist Visits in last 6 months?: Yes Specialist Visits details: 01/24/24Ethelene Browns (Neurology)- no med changes. 08/08/22- Ross (GYN)- no notes available in care everywhere. Was there a Hospital Visit in last 30 days?: No Were there other Hospital Visits in last 6 months?: No  Disease Assessments Visit Date Visit Completed on: 11/14/2022  Subjective Information Did patient bring medications to appointment?: No What source (s) was used to reconcile medications this visit?: EMR list, Patient/caregiver list Subjective: Completed  telemedicine visit with patients spouse. Isabel [name] lives with her husband and is independent with ADLs and manages her own medications at this time. They have 2 dogs- border collies which are very active and keeps them both very active. She no longer drives and her spouse takes her to her appts. Most noted health concern at this time is short term memory loss which has been a long standing issue that is followed by neurology. Lifestyle habits: Diet: working on weight gain, eating salads, Mediterranean diet based, Great grains cereal with fruit.  Exercise: walks dog daily Tobacco: quit 19 years ago Alcohol: denies Caffeine: 1 cup coffee daily  Recreational drugs: denies What is the patient's sleep pattern?: No sleep issues How many hours per night does patient typically sleep?: 9  SDOH: Accountable Health Communities Health-Related Social Needs Screening Tool ([employer]) SDOH questions completed during initial visit?: Yes What is your living situation today? (ref #1): I have a steady place to live Think about the place you live. Do you have problems with any of the following? (ref #2): None of the above Within the past 12 months, you worried that your food would run out before you got money to buy more (ref #3): Never true Within the past 12 months, the food you bought just didn't last and you didn't have money to get more (ref #4): Never true In the past 12 months, has lack of reliable transportation kept you from medical appointments, meetings, work or from getting things needed for daily living? (ref #5): No In the past 12 months, has the electric, gas, oil, or water company threatened to shut off services in your home? (ref #6): No How often does anyone, including family and friends, physically hurt you? (ref #7): Never (1) How often does anyone, including family and friends, insult or talk down to you? (ref #  8): Never (1) How often  does anyone, including friends and family, threaten you with harm? (ref #9): Never (1) How often does anyone, including family and friends, scream or curse at you? (ref #10): Never (1)  Medication Adherence Does the [facility] have access to medication refill history?: No Name and location of Current pharmacy: [facility] [place] Current Rx insurance plan: [employer] Are meds synced by current pharmacy?: No Are meds delivered by current pharmacy?: No - delivery not available Would patient benefit from direct intervention of clinical lead in dispensing process to optimize clinical outcomes?: No Are [facility] pharmacy services available where patient lives?: No Does patient experience delays in picking up medications due to transportation concerns (getting to pharmacy)?: No Medication organization: pill organizer- self organizes at this time, but spouse will assist when needed. Assessment:: Adherent  Hypertension (HTN) Most Recent BP: 98/68 Most Recent HR: 73 taken on: 08/08/2022 Care Gap: Need BP documented or last BP 140/90 or higher: Addressed Assessed today?: Yes Goal: <140/90 mmHG Is Patient checking BP at home?: No Has patient experienced hypotension, dizziness, falls or bradycardia?: No We discussed: DASH diet:  following a diet emphasizing fruits and vegetables and low-fat dairy products along with whole grains, fish, poultry, and nuts. Reducing red meats and sugars., Increasing movement, Increasing exercise (walking, biking, swimming) to a goal of 30 minutes per day, as able based on current activity level and health or as directed by your healthcare provider., Contacting PCP office for signs and symptoms of high or low blood pressure (hypotension, dizziness, falls, headaches, edema) Assessment:: Controlled Drug: Amlodipine  once a day Pharmacist Assessment: Appropriate, Effective, Safe, Accessible Plan/Follow up: Goal: No s/sx of hypotension or BPs less than 90/50. Modify: No changes to  regimen at this time. Counsel: Monitor for s/sx of hypotension, dizziness, lightheadedness, and counseled on fall risk reduction.  Hyperlipidemia/Dyslipidemia (HLD) Last Lipid panel on: 08/08/2022 TC (Goal<200): 209 LDL: 82 HDL (Goal>40): 108 TG (Goal<150): 91 ASCVD 10-year risk?is:: N/A due to lab values outside the range Assessed today?: No Drug: Rosuvastatin  once a day Pharmacist Assessment: Appropriate, Effective, Safe, Accessible  GERD Assessed today?: No  Chronic kidney disease (CKD) Most Recent GFR: 53 taken on: 08/08/2022 Previous GFR: 56 taken on: 03/20/2022 Most recent microalbumin ratio: 73 tested on: 08/08/2022 Assessed today?: No  Osteopenia or Osteoporosis Most recent T-score: L-spine -3.2, RFN -2.0, LFN -2.4 taken on: 02/04/2018 Previous T-score: L-spine -3.0, dual femur total left -2.1 taken on: 10/25/2015 Most recent Vitamin D 25-OH: 47 taken on: 08/08/2022 Previous Vitamin D 25-OH: 44 taken on: 03/20/2022 Assessed today?: Yes Patient has: Osteoporosis In the past 12 months, have you fallen?: Yes What were the circumstances?: Stumbled over the dog, no injury How many times have you fallen?: 1 Are there any stairs in or around the home?: No Is the home free of loose throw rugs in walkways, pet beds, electrical cords, etc.?: Yes Is there adequate lighting in your home to reduce the risk of falls?: Yes Risk Factors: Low BMI We discussed: Weight bearing exercises (walking, light weights, resistance training), Dietary calcium intake, Fall prevention, Vitamin D supplementation Assessment:: Controlled Drug: Cholecalciferol 1000 units daily  Pharmacist Assessment: Appropriate, Effective, Safe, Accessible Drug: Prolia - Inject once monthly Pharmacist Assessment: Appropriate, Effective, Safe, Accessible Plan/Follow up: Goal: Patient will not have fall or fracture in the next year and will continue Prolia + Vitamin D Modify:  Continue Vit D +  Prolia Order:  Dexa due (scheduled for April 24) Counsel: Pursue a  combination of weight-bearing exercises and strength training. Advised on fall prevention measures including proper lighting in all rooms, removal of area rugs and floor clutter, use of walking devices as deemed appropriate, avoidance of uneven walking surfaces. Consume 800 to 1000 IU of vitamin D daily with a goal vitamin D serum value of 30 ng/mL or higher. Aim for 1000 to 1200 mg of elemental calcium daily through supplements and/or dietary sources.  Hypothyroidism Most recent TSH: 3.05 taken on: 08/08/2022 Previous TSH: 1.44 taken on: 03/20/2022 Most recent T4: unavailable  Most recent T3: unavailable  Assessed today?: No Drug: Levothyroxine once a day Pharmacist Assessment: Appropriate, Effective, Safe, Accessible Insomnia Assessed today?: No Drug: Trazodone  0.5-1 tablet before bedtime as needed Pharmacist Assessment: Appropriate, Effective, Safe, Accessible General Assessed today?: Yes What condition are we assessing today?: Dementia Additional Info: Report hard to determine if medication is effective so unsure if combination is making any improvements. Assessment:: Uncontrolled Drug: Donepezil - 1 tablet at bedtime Pharmacist Assessment: Appropriate, Query Effectiveness Drug: Memantine - 1 tablet 2 times daily  Pharmacist Assessment: Appropriate, Query Effectiveness Plan/Follow up: Goal:  Improvement or stability in short term memory and recall Modify:  No changes to medications at this time- follow up with Dr. Terrace Arabia scheduled soon per pt/spouse report. Counsel: Take medications as prescribed, encouraged memory exercises if feels they are beneficial and discuss concerns with neurologist at upcoming visit. General Assessed today?: No Drug: Tizanidine  - 1/2-1 tablet 2-3 times daily as needed for back pain Pharmacist Assessment: Appropriate, Effective, Safe, Accessible Preventative  Health Care Gap: Colorectal cancer screening: Needs to be addressed Care Gap: Breast cancer screening: Needs to be addressed Care Gap: Annual Wellness Visit (AWV): Addressed Immunizations needed: RSV, Tdap or Td, Zoster, COVID-19 Additional exercise counseling points. We discussed: targeting at least 150 minutes per week of moderate-intensity aerobic exercise., incorporating flexibility, balance, and strength training exercises Additional diet counseling points. We discussed: aiming to consume at least 8 cups of water day, key components of the Mediterranean diet Plan/Follow up: Mammogram- pt reports having completed last year Colonoscopy- due now Dexa- scheduled in April, followed by Dr. Elvera Lennox  Clinical Summary Summary Next Pharmacist Follow Up: 07/17/2023 Next AWV: 08/09/2023  Recommended CRN Follow-Up in: Not indicated at this time Physician Referral Statement:: I reaffirm my previous referral of patient for CCM services Attestation Statement:: CCM Services:  This encounter meets complex CCM services and moderate to high medical decision making.  Prior to outreach and patient consent for Chronic Care Management, I referred this patient for services after reviewing the nominated patient list or from a personal encounter with the patient.  I have personally reviewed this encounter including the documentation in this note and have collaborated with the care management provider regarding care management and care coordination activities to include development and update of the comprehensive care plan I am certifying that I agree with the content of this note and encounter as supervising physician.  Pharmacist Interventions Intervention Details Pharmacist Interventions discussed: Yes Started Therapy: Immunizations recommended, Preventative therapy Monitoring: Preventative health screenings, Routine monitoring Education: Lifestyle modifications Follow Ups CRN Follow Up and Interim  Scheduling CRN visit Scheduled and protocol assigned: N/A Care Plan created and initiated: Done  CP Care Plan  WORK [name],Isabel  67 years, Female  DOB: 31-Dec-1955  M:   __________________________________________________ General Information Details Chronic Conditions: Hypertension (HTN), Hypothyroidism, Chronic Kidney Disease (CKD), Hyperlipidemia/Dyslipidemia (HLD), Other, Osteoporosis Details: Dementia, Vitamin D deficiency Contact your clinical pharmacist and care team  with any questions or concerns. Team Phone #:: [phone] Clinical Pharmacist:: Carmelina Dane Coordinating Registered Nurse: Jorje Guild [facility] Concierge:: [facility] High Blood Pressure GOAL: Maintain Blood Pressure less than: 130/80 Your most recent BP is: 98/68 taken on: 08/08/2022 Patient education:: Recommend following the DASH diet, which emphasizes fruits and vegetables and low-fat dairy products along with whole grains, fish, poultry, and nuts. Reduce red meats and sugars., Recommend limiting the daily amount of salt intake to less than 1500mg /per day or 2/3 teaspoonful a day, Discussed ways to increase movement., Discussed increasing exercise (walking, biking, swimming) to a goal of 30 minutes per day, as able based on current activity level and health and/or as directed by your healthcare provider., Educated patient to call PCP office if you develop episodes of low blood pressure, dizziness, falls or increased headaches, and/or swelling of legs and feet. Your current blood pressure medications are:: Amlodipine 5mg  once a day  High Cholesterol GOAL:: Maintain triglycerides less than 150, Maintain LDL (bad) cholesterol less than 100 Your most recent LDL level is: 82 Your most recent triglycerides level is: 91 taken on: 08/08/2022 Patient education:: Discussed increasing exercise (walking, biking, swimming) to a goal of 30 minutes per day, as able based on current activity level and health and/or as  directed by your healthcare provider., Discussed how a diet high in fruits/vegetables/nuts/whole grains/beans may help to reduce your cholesterol. Increasing soluble fiber intake (whole grains, fruits, vegetables, beans, nuts and seeds). Recommended avoiding sugary foods and trans fat, limiting carbohydrates, and reducing portion sizes. Recommended Patient to incorporate more healthy fats (salmon, cold-water fish, almonds, walnuts) into their diet. Your current cholesterol medications are:: Rosuvastatin 20mg  once a day  Chronic kidney disease (CKD) GOAL:: Slow progression of kidney disease and maintain blood pressure less than 130/80 Patient education:: Educated Patient on importance of blood pressure and blood sugar control to help stop kidney disease from worsening., Recommend avoiding medications that can harm the kidneys, such as a group of medications called NSAIDs, which include ibuprofen (Advil/Motrin) and naproxen (Aleve)., Recommend limiting dietary sodium intake  to less than 1 teaspoonful or 2000 mg per day.  Osteoporosis GOAL:: Prevent fractures and disabilities related to osteoporosis Patient education:: Educated Patient on home safety and fall prevention, Recommend vitamin D 800 IU once a day to help improve low vitamin D levels and healthy bones., Recommend daily calcium intake of 1,200 mg per day.  Calcium is found in foods like yogurt, milk, orange juice with calcium, dark green leafy vegetables, and cheese.  If additional calcium supplement is needed, calcium citrate is the preferred form of calcium., Recommend doing weight bearing exercises, such as walking, light weights, and/or resistance training as tolerated., To avoid falls, recommend removing any loose throw rugs from walkways and moving any objects obstructing pathways in the home. Ensure there is adequate lighting, especially if moving around the house at night. Contact your primary care provider or pharmacist if any of the  medications you take make you feel dizzy or too drowsy. Your current osteoporosis medications are:: Prolia 60mg  injection Vitamin D 1000u once daily  Hypothyroidism GOAL:: Maintain TSH levels within target range of 0.450 and 4.500 Patient education:: Recommend taking levothyroxine first thing in the morning before eating any food or drinking anything other than water. Take with a full glass of water and wait at least 30 minutes before eating for the day. Separate from vitamins by at least 4 hours. Your current thyroid medications are:: Levothyroxine once a day  Other Condition Chronic Condition:: Dementia/Memory Loss GOAL:: Show improvements in short term memory, recall, and attention Patient education:: Continue to take medications as prescribed: Memantine 10mg - 1 tablet 2 times daily Donepezil 10mg - 1 tablet at bedtime  Other Needs Recommended Preventative Health Care:: Recommend scheduling Annual Wellness Visit, Recommend colorectal cancer screening, Recommend breast cancer screening, Recommend yearly flu vaccine, Recommend a bone density screening for women over age 50, Recommend Shingrix vaccine  Cp Chart Prep 13 min  CP Telemedicine Visit 37 min  CP OV Documentation 17 min  CP Initial Care Plan 5 min

## 2022-11-28 ENCOUNTER — Ambulatory Visit: Payer: Medicare HMO

## 2022-11-28 ENCOUNTER — Encounter: Payer: Self-pay | Admitting: Internal Medicine

## 2022-11-28 ENCOUNTER — Ambulatory Visit: Payer: Medicare HMO | Admitting: Internal Medicine

## 2022-11-28 VITALS — BP 110/58 | HR 77 | Ht 61.0 in | Wt 97.4 lb

## 2022-11-28 DIAGNOSIS — M810 Age-related osteoporosis without current pathological fracture: Secondary | ICD-10-CM

## 2022-11-28 DIAGNOSIS — R748 Abnormal levels of other serum enzymes: Secondary | ICD-10-CM

## 2022-11-28 DIAGNOSIS — I10 Essential (primary) hypertension: Secondary | ICD-10-CM | POA: Diagnosis not present

## 2022-11-28 DIAGNOSIS — E559 Vitamin D deficiency, unspecified: Secondary | ICD-10-CM

## 2022-11-28 DIAGNOSIS — E782 Mixed hyperlipidemia: Secondary | ICD-10-CM | POA: Diagnosis not present

## 2022-11-28 DIAGNOSIS — N182 Chronic kidney disease, stage 2 (mild): Secondary | ICD-10-CM | POA: Diagnosis not present

## 2022-11-28 MED ORDER — DENOSUMAB 60 MG/ML ~~LOC~~ SOSY
60.0000 mg | PREFILLED_SYRINGE | Freq: Once | SUBCUTANEOUS | Status: AC
  Administered 2022-11-28: 60 mg via SUBCUTANEOUS

## 2022-11-28 NOTE — Progress Notes (Signed)
After obtaining consent, and per orders of Dr. Gherghe, injection of Prolia given by Naia Ruff H Amariz Flamenco. Patient instructed to remain in clinic for 20 minutes afterwards, and to report any adverse reaction to me immediately.  

## 2022-11-28 NOTE — Progress Notes (Signed)
Patient ID: Isabel [name] Work Isabel [name], female   DOB: 08-02-1955, 67 y.o.   MRN: [id number]   HPI  Isabel [name] Work Isabel [name] is a 67 y.o.-year-old female, returning for follow-up for osteoporosis and history of vitamin D deficiency.  Last visit 1 year ago. She is here with her husband.  Interim history: No fall/fractures since last OV. She has more back pain after the last nerve burn in 04/2021 and feels that this is extending up and down her back.  She was able to start PT afterwards. She still did not schedule another bone density since last visit. Before last visit, she was diagnosed with dementia and started on memantine.  Aricept was added.  Pt was dx with OP in 2013.  Reviewed DXA scan reports: Date L1-L4 T score FN T score  02/04/2018 ([place]) -3.2 RFN: -2.0 LFN: -2.4  10/15/2015 ([facility])  -3.0  RFN: -1.8 LFN: -2.1   10/07/2013 [facility])  -3.1 (-5.6%*)  RFN: -1.9 LFN: -2.2   07/10/2011 [facility])  -2.8  RFN: n/a LFN: n/a   + h/o one fracture as a child: - R arm - in 67 (59 y/o)  She fell from the kitchen counter in 2000.  No fractures as an adult.  Treatments reviewed: - Alendronate: for many years >> stopped because of possible ONJ.  Pain was migratory-questionable nerve pain. - Prolia -05/02/2018, 11/25/2018, 06/11/2019, 01/02/2020, 11/25/2020, 06/21/2021, 03/14/2022. She missed the last Prolia injection. They were contacted and advised of the payment and the husband wanted to discuss it at the appointment - $327 out of pocket cost.  However, the husband did not remember this conversation at today's visit.  After our last visit, she was reticent to continue with Prolia.  I referred her to Dr. Gwendolyn Grant at [facility] for a second opinion.  He suggested to continue with Prolia versus using Actonel.  Patient decided to continue with Prolia.  Reviewed the reports  of her 3-D reconstructed images of her mouth from 09/06/2016: "The region of interest demonstrates  radiographic signs that may be suggestive of osteonecrosis/osteomyelitis. If the patient has a history of taking bisphosphonate drugs, the region of interest would correlate with osteonecrosis. Otherwise, the region of interest may be osteomyelitis. Clinical correlation is recommended".  Her N-telopeptide was not suppressed in 2018 (ideally <10), but not higher than 18: Component     Latest Ref Rng & Units 11/23/2016  N-Telopeptide     6.2 - 19.0 nM BCE 15.4   Vitamin D level normal at last check but this was high in the past: Lab Results  Component Value Date   VD25OH 47 08/08/2022   VD25OH 44 03/20/2022   VD25OH 55 08/05/2021   VD25OH 54 02/02/2021   VD25OH 59.6 11/25/2020   VD25OH 62 07/21/2020   VD25OH 41 01/21/2020   VD25OH >120 11/25/2019   VD25OH 86 07/21/2019   VD25OH 113.35 (HH) 11/25/2018   She is on: - Calcium 500 mg daily - Vitamin D 6000 >> 8000 >> 6000 >> 3000 >> 2000 IU  No weightbearing exercises. She was practicing yoga x 9 years >> then stopped b/c pain with movement.  She walks and play with her dogs in the woods behind her home.  No history of high vitamin A intake.  It is unclear when she started menopause as she was taking hormonal tx for severe HAs (from severe cervical OA). She was also on Methadone, tried Botox.  She has nerve burns at [facility].  Her mother  has a history of a dowager hump.  No history of kidney stones.  She had 2 slightly high calcium levels in the past:  Lab Results  Component Value Date   PTH 31 11/25/2020   CALCIUM 9.9 08/08/2022   CALCIUM 10.0 03/20/2022   CALCIUM 9.3 12/14/2021   CALCIUM 9.8 08/05/2021   CALCIUM 9.4 02/02/2021   CALCIUM 9.8 11/25/2020   CALCIUM 10.6 (H) 10/27/2020   CALCIUM 10.0 09/02/2020   CALCIUM 11.0 (H) 07/21/2020   CALCIUM 9.6 04/28/2020   No history of hyperthyroidism.  She takes levothyroxine 25 mcg daily.  Reviewed previous TFTs:: Lab Results  Component Value Date   TSH 3.05 08/08/2022   TSH 1.44  03/20/2022   TSH 2.05 12/14/2021   TSH 2.76 08/05/2021   TSH 1.33 02/02/2021   No CKD.  Last BUN/creatinine: Lab Results  Component Value Date   BUN 17 08/08/2022   CREATININE 1.14 (H) 08/08/2022   She has a history of low alkaline phosphatase: Lab Results  Component Value Date   ALKPHOS 25 (L) 11/25/2019   ALKPHOS 22 (L) 11/25/2018   ALKPHOS 26 (L) 02/26/2017   ALKPHOS 23 (L) 11/08/2016   ALKPHOS 27 (L) 05/03/2016   ALKPHOS 19 (L) 11/03/2015   ALKPHOS 24 (L) 05/04/2015   ALKPHOS 22 (L) 12/17/2014   ALKPHOS 23 (L) 09/02/2014   ALKPHOS 27 (L) 04/23/2014   ALKPHOS 29 (L) 12/16/2013   ALKPHOS 40 09/08/2013   She has extensive FH of Alzheimer on her father's side.  ROS: + see HPI  I reviewed pt's medications, allergies, PMH, social hx, family hx, and changes were documented in the history of present illness. Otherwise, unchanged from my initial visit note.  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   Social History Main Topics   Smoking status: Former Smoker    Quit date: 06/07/1994   Smokeless tobacco: Never Used   Alcohol use Yes     Comment: rare   Drug use: No   Current Outpatient Medications on File Prior to Visit  Medication Sig Dispense Refill   amLODipine (NORVASC) 5 MG tablet Take  1 tablet  Daily for BP                                                                                /  TAKE                                                      BY                                                 MOUTH                                                    ONE TIME 90 tablet 3   Cholecalciferol 25 MCG (1000 UT) capsule Take 1 capsule (1,000 Units total) by mouth daily.     donepezil (ARICEPT) 10 MG tablet Take 1 tablet (10 mg total) by mouth at bedtime. 90 tablet 4   levothyroxine (SYNTHROID) 25 MCG tablet TAKE ONE TABLET BY MOUTH ONE TIME DAILY ON AN EMPTY STOMACH WITH ONLY WATER FOR 30 MINUTES AND NO ANTACID MEDS, CALCIUM, OR MAGNESIUM FOR 4 HOURS AND AVOID BIOTIN 90 tablet 3   memantine (NAMENDA) 10 MG tablet Take 1 tablet (10 mg total) by mouth 2 (two) times daily. 180 tablet 3   mirtazapine (REMERON) 7.5 MG tablet ^Take 1 tablet at Bedtime for Sleep                                                                     /                                                        TAKE                                      BY                                  MOUTH 90 tablet 3   rosuvastatin (CRESTOR) 20 MG tablet TAKE ONE TABLET BY MOUTH ONE TIME DAILY FOR CHOLESTEROL 90 tablet 1   tiZANidine (ZANAFLEX) 4 MG tablet Take 1/2 to 1 tablet 2 to 3 x /day as needed for Back Pain                                        /                              TAKE  BY                                      MOUTH 90 tablet 1   traZODone (DESYREL) 150 MG tablet Take  1/2 to 1 tablet  1 hour  before Bedtime  as needed for Sleep 90 tablet 1   No current facility-administered medications on file prior to visit.   Allergies  Allergen Reactions   Aleve [Naproxen Sodium] Rash        Lipitor [Atorvastatin] Other (See Comments)   Niacin And Related Anaphylaxis   Omnaris [Ciclesonide] Other (See Comments)    headache   Ceftin [Cefuroxime Axetil] Rash   Other Other (See Comments)   Levaquin [Levofloxacin In D5w]     Muscle cramps   Simvastatin    Zetia [Ezetimibe]     Family History  Problem Relation Age of Onset   Depression Mother    Migraines Mother    Stroke Mother    Alzheimer's disease Father    Hyperlipidemia Father    Hypertension Father    Migraines Sister    Migraines Brother    Migraines Maternal Grandmother    Colon cancer Neg Hx    PE: BP (!) 110/58 (BP Location: Left Arm, Patient Position: Sitting, Cuff Size: Normal)   Pulse 77   Ht 5\' 1"  (1.549 m)   Wt 97 lb 6.4 oz (44.2 kg)   SpO2 96%   BMI 18.40 kg/m  Wt Readings from Last 3 Encounters:  11/28/22 97 lb 6.4 oz (44.2 kg)  08/23/22 98 lb 3.2 oz (44.5 kg)  08/08/22 95 lb 9.6 oz (43.4 kg)   Constitutional:  thin, in NAD Eyes:  EOMI, no exophthalmos ENT: no neck masses, no cervical lymphadenopathy Cardiovascular: RRR, No MRG Respiratory: CTA B Musculoskeletal: no deformities Skin:no rashes Neurological: no tremor with outstretched hands  Assessment: 1. Osteoporosis - Of note, she finished 1 year of osteo-strong skeletal loading.  Discussed with her $89 per month and was not covered by insurance  2. Vit D deficiency  3. Low alkaline phosphatase  4.  Hypercalcemia -She had 2 instances of slightly high calcium in the past, but at last visit, calcium level was normal.  A PTH level was also normal -Most recently, calcium levels were normal  Other tx'ing Dr's: Dr. Annia Belt Dr. Yolanda Bonine  Plan: 1. Osteoporosis -Likely age-related/postmenopausal +/- family history of osteoporosis -No falls or fractures since last visit -On the latest bone mineral density report, her T-scores were slightly lower than before, however, the last report was generated on another densitometer, so the scores are not directly comparable.  They were low enough in the osteoporosis range to recommend treatment, though.  We previously discussed about getting another bone density but I ended up ordering the scan in 2021, 2022, and 2023 and she still did not have this done. I will not order  any more of these for her for now.   -She was initially very reticent to start osteoporosis treatment, as she has a high risk for ONJ (she was diagnosed with this in the past after being on Fosamax).  After discussion about different options for treatment including benefits and possible side effects, she initially opted for Forteo, but after it became off label, and not likely to be covered by her insurance, she decided to proceed with Prolia.  First injection was on 05/02/2018.  Last injection was  03/14/2022.  They missed the last injection and were called about it last month.  A conversation was documented with the husband but he did not remember having talked to our office about this. -She previously contacted me about doubts continue with Prolia and at that time I suggested a second opinion at [facility], with Dr. Gwendolyn Grant.  He saw the patient in consultation and recommended to continue with Prolia versus Actonel.  She decided to continue with Prolia. -She tolerates Prolia well, without jaw/thigh/hip pain -We can continue for 10 years or more, if needed -She missed the last injection of Prolia, which was due 08/2022, therefore, last injection was in 02/2022, 8 months ago -At last visit and again today we previously discussed about not waiting more than 7 months between injections of Prolia to avoid drastic loss of BMD and increased risk for fractures. -At last visit I recommended increased exercise.  I explained to her and her husband why this was important and the mechanism by which exercise could stimulate bone growth.  At today's visit, they mention that she is only walking.  I again recommended some weightbearing exercises: Dumbbells, elastic bands. -For now, we will continue to follow her on Prolia.  Will give her the injection today and I advised him to put it on the calendar to have it again in October-November. -I will see her back in 1 year  2. Vit D deficiency -We gradually decreased her  vitamin D daily dose from 8000 to 6000 to 3000 to 2000 units daily as her vit D was >120 in 10/2019 -Her latest vitamin D level was normal, 47, in 07/2022 -will continue 2000 units vitamin D daily  3. Low Aphos -She has slightly low alkaline phosphatase -Possible causes are: hypophosphatasia, blood collected with EDTA or oxalate anticoagulant, hypothyroidism, vitamin C and B12 deficiency, Milk alkali syndrome, protein/calorie malnutrition, zinc and magnesium deficiency. -Her TSH was normal.  She is taking magnesium and B12.  No previous history of fractures or tooth problems to suggest hypophosphatasia.  She had a high vitamin D level but her alkaline phosphatase remains low even after correcting this. -We started multivitamin daily, but she was not taking this at last visit and again today.  I again advised her to start. She takes this inconsistently.  Carlus Pavlov, MD PhD [facility]

## 2022-11-28 NOTE — Patient Instructions (Addendum)
Please continue vitamin D.  Please come back for a follow-up appointment in 1 year.

## 2022-12-04 ENCOUNTER — Encounter: Payer: Self-pay | Admitting: Internal Medicine

## 2022-12-04 ENCOUNTER — Other Ambulatory Visit: Payer: Self-pay | Admitting: Internal Medicine

## 2022-12-04 MED ORDER — TIZANIDINE HCL 4 MG PO TABS: ORAL_TABLET | ORAL | 1 refills | Status: DC

## 2022-12-13 NOTE — Telephone Encounter (Signed)
Last  Prolia inj 11/28/22 Next Prolia inj due 05/31/23 

## 2022-12-14 NOTE — Progress Notes (Addendum)
ANNUAL WELLNESS VISIT AND FOLLOW UP   Assessment and Plan:  Annual Medicare Wellness Visit Due annually  Health maintenance reviewed - schedule mammogram with GYN Dr. Tenny Craw 08/09/22  Other osteoporosis without current pathological fracture She is receiving Prolia Q62months via Dr. Elvera Lennox and is taking Ca++ and Vit D. Last injection 03/14/22- missed last injection which was due 08/2022, last injection given 11/28/22 Last DEXA 2019, has been ordered multiple times by Dr Elvera Lennox but never completed- she does not plan to order another for now  Hypothyroidism, unspecified type continue medications the same pending lab results reminded to take on an empty stomach 30-4mins before food.  -     TSH  Mixed hyperlipidemia Taking Crestor, fish oil LDL goal <100 decrease fatty foods increase activity.  -     Lipid panel  Other migraine without status migrainosus, not intractable Recently improved; remains off of aimovig and remains controlled Has Zolitriptan PRN  Abnormal Glucose Recent A1Cs at goal Discussed diet/exercise, weight management  Defer A1C; check CMP  Vitamin D deficiency Taking daily supplimentation, continue  Gastroesophageal reflux disease, esophagitis presence not specified Continue PPI/H2i; do food log to identify triggers Discussed diet, avoiding triggers and other lifestyle changes  Essential Hypertension - continue medications, DASH diet, exercise and monitor at home. Call if greater than 130/80.  Patient to monitor at home and contact office if elevation is consistent . CKD stage 3 A Increase fluids, avoid NSAIDS, monitor sugars, will monitor - CMP - microalbumin /creatinine urine ratio  Medication management        -     Magnesium  -     TSH -     COMPLETE METABOLIC PANEL WITH GFR -     Urinalysis w microscopic + reflex cultur  BMI <19/ Mild protein/calorie malnutrition Weight gain encouraged; boost, ensure, high protein calorie diet reviewed and  recommended Improving with low dose remeron 7.5 mg daily, continue for now  Aim to get back up to baseline 100 lb Check CMP/GFR  Dementia without behavioral disturbance (HCC) Poor short term memory and attention/calculation Given information on memory compensation strategies and lions mane mushrooms Dr. Terrace Arabia following Continue aricept/namenda Does have chronic microvascular changes - vascular health, exercise, mind diet, memory exercises reviewed Monitor  Microalbuminuria - repeat microalbumin/creatinine ratio   IUD in place x 40+ years Decision by GYN to leave in place  Lumbar spondylosis Followed by Pain Management at [facility]    Discussed med's effects and SE's. Screening labs and tests as requested with regular follow-up as recommended. Over 40 minutes of exam, counseling, chart review, and complex, high level critical decision making was performed this visit.   Future Appointments  Date Time Provider Department Center  03/01/2023  2:30 PM Butch Penny, NP [facility] None  06/19/2023  1:45 PM [facility] NURSE [facility] None  08/09/2023 10:00 AM Adela Glimpse, NP [facility] None  11/26/2023  1:40 PM Carlus Pavlov, MD [facility] None    Plan:   During the course of the visit the patient was educated and counseled about appropriate screening and preventive services including:   Pneumococcal vaccine  Prevnar 13 Influenza vaccine Td vaccine Screening electrocardiogram Bone densitometry screening Colorectal cancer screening Diabetes screening Glaucoma screening Nutrition counseling  Advanced directives: requested   HPI  67 y.o. female  presents for AWV and follow up for has Hypertension; Hyperlipidemia; GERD (gastroesophageal reflux disease); Cervical spondylosis without myelopathy; Headache, migraine; Vitamin D deficiency; Medication management; Age-related osteoporosis without current pathological fracture; Hypothyroidism; Insomnia; Right bundle  branch block  (RBBB) on electrocardiogram (ECG); Adult BMI <19 kg/sq m; CKD (chronic kidney disease) stage 2, GFR 60-89 ml/min; Cognitive impairment; Dementia without behavioral disturbance, psychotic disturbance, mood disturbance, or anxiety (HCC); and Microalbuminuria on their problem list.   She has had migraines since she was 67 y/o; she is on amlodipine 5 mg and aimovig injection for many years but was able to taper off recently. She has zolmitriptan as abortive which works well for her but last use was remote.   Significant memory changes in recent years, persistent poor short term recall over the pandemic and recently referred to est with new neurologist Dr. Terrace Arabia, had MRI showing advanced for age microvascular changes, and mild generalized atrophy, felt most consistent with central nervous system degenerative disorder and has been initiated on aricept/nemenda, patient notes memory is stable/improved in the last 6 months. Husband helps to monitor/manage meds.   She is doing well with trazodone 150 mg daily for sleep. Mirtazapine as well for appetite.  She is sleeping 10 hours a night.   She has a diagnosis of GERD which is currently managed by lifestyle, rare breakthrough, takes tums occasionally.   BMI is Body mass index is 18.18 kg/m., she has been working on diet and exercise. Back pain is improved . Her weight was down to 89 lb, started on low dose remeron 7.5 mg with good perceived results, weight up 7 LBS. She has been eating a little better, cereal with fruit and milk. She does have 2 border collies and walks them Wt Readings from Last 3 Encounters:  12/18/22 96 lb 3.2 oz (43.6 kg)  11/28/22 97 lb 6.4 oz (44.2 kg)  08/23/22 98 lb 3.2 oz (44.5 kg)   Her blood pressure has been controlled at home currently on Norvasc 5 mg QD, today their BP is BP: (!) 124/58  BP Readings from Last 3 Encounters:  12/18/22 (!) 124/58  11/28/22 (!) 110/58  08/23/22 (!) 166/88  She does workout in the form of walking.  She denies chest pain, shortness of breath, dizziness.   She is on cholesterol medication (rosuvastatin 20 mg daily)  and denies myalgias. Her cholesterol is not at goal. The cholesterol last visit was:   Lab Results  Component Value Date   CHOL 209 (H) 08/08/2022   HDL 108 08/08/2022   LDLCALC 82 08/08/2022   TRIG 91 08/08/2022   CHOLHDL 1.9 08/08/2022   She has been working on diet and exercise for hx of prediabetes, she is not on bASA, she is not on ACE/ARB and denies nausea, polydipsia and polyuria. Last A1C in the office was:  Lab Results  Component Value Date   HGBA1C 5.4 08/08/2022   She is on thyroid medication. Her medication was not changed last visit.  Taking 25 mcg daily.  Lab Results  Component Value Date   TSH 3.05 08/08/2022   Last GFR: Lab Results  Component Value Date   EGFR 53 (L) 08/08/2022    Patient is on Vitamin D supplement, now taking 1000 mcg, was recommende dreduced dose by Dr. Elvera Lennox. She manages osteoporosis, getting prolia injections.She is receiving Prolia Q11months via Dr. Elvera Lennox and is taking Ca++ and Vit D. Last injection 03/14/22- missed last injection which was due 08/2022, last injection given 11/28/22 at Dr Althea Charon Lab Results  Component Value Date   VD25OH 47 08/08/2022       Current Medications:  Current Outpatient Medications on File Prior to Visit  Medication Sig Dispense Refill  amLODipine (NORVASC) 5 MG tablet Take  1 tablet  Daily for BP                                                                                /                                                               TAKE                                                      BY                                                 MOUTH                                                    ONE TIME 90 tablet 3   Cholecalciferol 25 MCG (1000 UT) capsule Take 1 capsule (1,000 Units total) by mouth daily.     donepezil (ARICEPT) 10 MG tablet Take 1 tablet (10 mg total) by mouth at  bedtime. 90 tablet 4   levothyroxine (SYNTHROID) 25 MCG tablet TAKE ONE TABLET BY MOUTH ONE TIME DAILY ON AN EMPTY STOMACH WITH ONLY WATER FOR 30 MINUTES AND NO ANTACID MEDS, CALCIUM, OR MAGNESIUM FOR 4 HOURS AND AVOID BIOTIN 90 tablet 3   memantine (NAMENDA) 10 MG tablet Take 1 tablet (10 mg total) by mouth 2 (two) times daily. 180 tablet 3   mirtazapine (REMERON) 7.5 MG tablet ^Take 1 tablet at Bedtime for Sleep                                                                     /                                                        TAKE                                      BY  MOUTH 90 tablet 3   rosuvastatin (CRESTOR) 20 MG tablet TAKE ONE TABLET BY MOUTH ONE TIME DAILY FOR CHOLESTEROL 90 tablet 1   tiZANidine (ZANAFLEX) 4 MG tablet Take 1/2 to 1 tablet 2 to 3 x /day as needed for Back Pain                                        /                              TAKE                                            BY                                      MOUTH 90 tablet 1   traZODone (DESYREL) 150 MG tablet Take  1/2 to 1 tablet  1 hour  before Bedtime  as needed for Sleep 90 tablet 1   Multiple Vitamin (MULTIVITAMIN ADULT PO) PRN     oxymetazoline (NASAL RELIEF) 0.05 % nasal spray Place into the nose.     No current facility-administered medications on file prior to visit.   Allergies:  Allergies  Allergen Reactions   Aleve [Naproxen Sodium] Rash        Lipitor [Atorvastatin] Other (See Comments)   Niacin And Related Anaphylaxis   Omnaris [Ciclesonide] Other (See Comments)    headache   Ceftin [Cefuroxime Axetil] Rash   Other Other (See Comments)   Levaquin [Levofloxacin In D5w]     Muscle cramps   Simvastatin    Zetia [Ezetimibe]    Medical History:  She has Hypertension; Hyperlipidemia; GERD (gastroesophageal reflux disease); Cervical spondylosis without myelopathy; Headache, migraine; Vitamin D deficiency; Medication management; Age-related osteoporosis  without current pathological fracture; Hypothyroidism; Insomnia; Right bundle branch block (RBBB) on electrocardiogram (ECG); Adult BMI <19 kg/sq m; CKD (chronic kidney disease) stage 2, GFR 60-89 ml/min; Cognitive impairment; Dementia without behavioral disturbance, psychotic disturbance, mood disturbance, or anxiety (HCC); and Microalbuminuria on their problem list. Health Maintenance:   Immunization History  Administered Date(s) Administered   Covid-19, Mrna,Vaccine(Spikevax)44yrs and older 05/24/2022   Fluad Quad(high Dose 65+) 05/24/2022   Influenza, High Dose Seasonal PF 08/05/2021   Moderna Covid-19 Vaccine Bivalent Booster 58yrs & up 04/06/2021   Moderna SARS-COV2 Booster Vaccination 10/30/2020   Moderna Sars-Covid-2 Vaccination 09/12/2019, 10/10/2019, 05/22/2020   PNEUMOCOCCAL CONJUGATE-20 12/14/2021   PPD Test 09/08/2013   Pneumococcal-Unspecified 08/01/2007   Tdap 07/31/2011   Zoster, Live 11/21/2016   Health Maintenance  Topic Date Due   COVID-19 Vaccine (6 - 2023-24 season) 01/03/2023 (Originally 07/19/2022)   Zoster Vaccines- Shingrix (1 of 2) 03/20/2023 (Originally 09/05/1973)   DEXA SCAN  12/18/2023 (Originally 02/05/2020)   COLONOSCOPY (Pts 45-76yrs Insurance coverage will need to be confirmed)  12/18/2023 (Originally 07/03/2022)   INFLUENZA VACCINE  03/01/2023   Medicare Annual Wellness (AWV)  12/18/2023   MAMMOGRAM  08/09/2024   Pneumonia Vaccine 56+ Years old  Completed   Hepatitis C Screening  Completed   HPV VACCINES  Aged Out   DTaP/Tdap/Td  Discontinued   Health Maintenance  Topic Date Due   COVID-19 Vaccine (6 - 2023-24 season) 01/03/2023 (Originally 07/19/2022)   Zoster Vaccines- Shingrix (1 of 2) 03/20/2023 (Originally 09/05/1974)   DEXA SCAN  12/18/2023 (Originally 02/05/2020)   COLONOSCOPY (Pts 45-23yrs Insurance coverage will need to be confirmed)  12/18/2023 (Originally 07/03/2022)   INFLUENZA VACCINE  03/01/2023   Medicare Annual Wellness (AWV)  12/18/2023    MAMMOGRAM  08/09/2024   Pneumonia Vaccine 51+ Years old  Completed   Hepatitis C Screening  Completed   HPV VACCINES  Aged Out   DTaP/Tdap/Td  Discontinued   Eye Doctor Dr. Gentry Fitz 2023 Dentist: Burman Freestone 2024  Patient Care Team: Lucky Cowboy, MD as PCP - General (Internal Medicine) Meryl Dare, MD as Consulting Physician (Gastroenterology) Pricilla Riffle, MD as Consulting Physician (Cardiology) Kerrin Champagne, MD (Inactive) as Consulting Physician (Orthopedic Surgery) Waymon Budge, MD as Consulting Physician (Pulmonary Disease) Annia Belt, MD as Referring Physician (Neurology) Peggye Pitt, MD (Pain Medicine)  Surgical History:  She has a past surgical history that includes abdominal wall tear; Mouth surgery; fractured right wrist; Wrist surgery (Right, 1973); and LEEP. Family History:  Herfamily history includes Alzheimer's disease in her father; Depression in her mother; Hyperlipidemia in her father; Hypertension in her father; Migraines in her brother, maternal grandmother, mother, and sister; Stroke in her mother. Social History:  She reports that she quit smoking about 28 years ago. Her smoking use included cigarettes. She started smoking about 48 years ago. She has a 19.00 pack-year smoking history. She has never used smokeless tobacco. She reports that she does not drink alcohol and does not use drugs.  MEDICARE WELLNESS OBJECTIVES: Physical activity: Current Exercise Habits: Home exercise routine, Type of exercise: walking, Time (Minutes): 60, Frequency (Times/Week): 7, Weekly Exercise (Minutes/Week): 420, Intensity: Mild, Exercise limited by: psychological condition(s) Cardiac risk factors: Cardiac Risk Factors include: dyslipidemia;hypertension;microalbuminuria Depression/mood screen:      12/18/2022    2:18 PM  Depression screen PHQ 2/9  Decreased Interest 0  Down, Depressed, Hopeless 1  PHQ - 2 Score 1    ADLs:     12/18/2022    2:19 PM  03/20/2022    1:45 AM  In your present state of health, do you have any difficulty performing the following activities:  Hearing? 0 0  Vision? 0   Difficulty concentrating or making decisions? 1 1  Walking or climbing stairs? 0 0  Dressing or bathing? 0 0  Doing errands, shopping? 1 1  Comment  husb transports     EOL planning: Does Patient Have a Medical Advance Directive?: No Would patient like information on creating a medical advance directive?: No - Patient declined     12/18/2022    2:21 PM 08/05/2021   10:54 AM 10/27/2020    4:37 PM  MMSE - Mini Mental State Exam  Orientation to time 1 4 5   Orientation to Place 4 5 5   Registration 3 3 3   Attention/ Calculation 0 2 2  Recall 0 0 1  Language- name 2 objects 2 2 2   Language- repeat 1 1 1   Language- follow 3 step command 3 3 3   Language- read & follow direction 1 1 1   Write a sentence 0 1 1  Copy design 0 1 1  Total score 15 23 25       Review of Systems: Review of Systems  Constitutional:  Negative for malaise/fatigue and weight loss.  HENT:  Negative for hearing loss and tinnitus.  Eyes:  Negative for blurred vision and double vision.  Respiratory:  Negative for cough, sputum production, shortness of breath and wheezing.   Cardiovascular:  Negative for chest pain, palpitations, orthopnea, claudication, leg swelling and PND.  Gastrointestinal:  Negative for abdominal pain, blood in stool, constipation, diarrhea, heartburn, melena, nausea and vomiting.  Genitourinary: Negative.   Musculoskeletal:  Positive for back pain. Negative for falls, joint pain and myalgias.  Skin:  Negative for rash.  Neurological:  Negative for dizziness, tingling, sensory change, weakness and headaches (none in over 6 months).  Endo/Heme/Allergies:  Negative for polydipsia.  Psychiatric/Behavioral:  Positive for memory loss (memory changes, poor short term memory). Negative for depression, substance abuse and suicidal ideas. The patient is not  nervous/anxious and does not have insomnia.   All other systems reviewed and are negative.   Physical Exam: Estimated body mass index is 18.18 kg/m as calculated from the following:   Height as of this encounter: 5\' 1"  (1.549 m).   Weight as of this encounter: 96 lb 3.2 oz (43.6 kg). BP (!) 124/58   Pulse 78   Temp 97.9 F (36.6 C)   Ht 5\' 1"  (1.549 m)   Wt 96 lb 3.2 oz (43.6 kg)   SpO2 98%   BMI 18.18 kg/m  General Appearance: Thin pleasant female Eyes: PERRLA, EOMs, conjunctiva no swelling or erythema Sinuses: No Frontal/maxillary tenderness  ENT/Mouth: Ext aud canals clear, normal light reflex with TMs without erythema, bulging. Good dentition. No erythema, swelling, or exudate on post pharynx. Tonsils not swollen or erythematous. Hearing normal.   Neck: Supple, thyroid normal. No bruits  Respiratory: Respiratory effort normal, BS equal bilaterally without rales, rhonchi, wheezing or stridor.  Cardio: RRR without murmurs, rubs or gallops. Brisk peripheral pulses without edema.  Abdomen: Soft, nontender, no guarding, rebound, hernias, masses, or organomegaly.  Lymphatics: Non tender without lymphadenopathy.  Musculoskeletal: Full ROM all peripheral extremities,5/5 strength, and normal gait Skin: Warm, dry without rashes, lesions, ecchymosis. Neuro: Cranial nerves intact, reflexes equal bilaterally. Normal muscle tone, no cerebellar symptoms. Sensation intact. Poor short term memory, good attention.  Psych: Awake and oriented X 2, normal affect, Insight and Judgment appropriate.    Medicare Attestation I have personally reviewed: The patient's medical and social history Their use of alcohol, tobacco or illicit drugs Their current medications and supplements The patient's functional ability including ADLs,fall risks, home safety risks, cognitive, and hearing and visual impairment Diet and physical activities Evidence for depression or mood disorders  The patient's weight,  height, BMI, and visual acuity have been recorded in the chart.  I have made referrals, counseling, and provided education to the patient based on review of the above and I have provided the patient with a written personalized care plan for preventive services.    Raynelle Dick, NP-C 2:27 PM [facility]

## 2022-12-18 ENCOUNTER — Ambulatory Visit (INDEPENDENT_AMBULATORY_CARE_PROVIDER_SITE_OTHER): Payer: Medicare HMO | Admitting: Nurse Practitioner

## 2022-12-18 ENCOUNTER — Encounter: Payer: Self-pay | Admitting: Nurse Practitioner

## 2022-12-18 VITALS — BP 124/58 | HR 78 | Temp 97.9°F | Ht 61.0 in | Wt 96.2 lb

## 2022-12-18 DIAGNOSIS — I10 Essential (primary) hypertension: Secondary | ICD-10-CM

## 2022-12-18 DIAGNOSIS — E441 Mild protein-calorie malnutrition: Secondary | ICD-10-CM

## 2022-12-18 DIAGNOSIS — N1831 Chronic kidney disease, stage 3a: Secondary | ICD-10-CM | POA: Diagnosis not present

## 2022-12-18 DIAGNOSIS — F0390 Unspecified dementia without behavioral disturbance: Secondary | ICD-10-CM | POA: Diagnosis not present

## 2022-12-18 DIAGNOSIS — M47816 Spondylosis without myelopathy or radiculopathy, lumbar region: Secondary | ICD-10-CM

## 2022-12-18 DIAGNOSIS — Z0001 Encounter for general adult medical examination with abnormal findings: Secondary | ICD-10-CM

## 2022-12-18 DIAGNOSIS — E039 Hypothyroidism, unspecified: Secondary | ICD-10-CM

## 2022-12-18 DIAGNOSIS — R6889 Other general symptoms and signs: Secondary | ICD-10-CM

## 2022-12-18 DIAGNOSIS — G43809 Other migraine, not intractable, without status migrainosus: Secondary | ICD-10-CM

## 2022-12-18 DIAGNOSIS — E782 Mixed hyperlipidemia: Secondary | ICD-10-CM | POA: Diagnosis not present

## 2022-12-18 DIAGNOSIS — Z681 Body mass index (BMI) 19 or less, adult: Secondary | ICD-10-CM

## 2022-12-18 DIAGNOSIS — Z975 Presence of (intrauterine) contraceptive device: Secondary | ICD-10-CM | POA: Diagnosis not present

## 2022-12-18 DIAGNOSIS — Z79899 Other long term (current) drug therapy: Secondary | ICD-10-CM | POA: Diagnosis not present

## 2022-12-18 DIAGNOSIS — R7309 Other abnormal glucose: Secondary | ICD-10-CM

## 2022-12-18 DIAGNOSIS — E559 Vitamin D deficiency, unspecified: Secondary | ICD-10-CM | POA: Diagnosis not present

## 2022-12-18 DIAGNOSIS — Z0000 Encounter for general adult medical examination without abnormal findings: Secondary | ICD-10-CM

## 2022-12-18 DIAGNOSIS — M810 Age-related osteoporosis without current pathological fracture: Secondary | ICD-10-CM

## 2022-12-18 NOTE — Patient Instructions (Signed)

## 2022-12-18 NOTE — Addendum Note (Signed)
Addended by: Anda Kraft E on: 12/18/2022 03:59 PM   Modules accepted: Orders

## 2022-12-19 LAB — COMPLETE METABOLIC PANEL WITH GFR
AG Ratio: 2 (calc) (ref 1.0–2.5)
ALT: 13 U/L (ref 6–29)
AST: 25 U/L (ref 10–35)
Albumin: 4.7 g/dL (ref 3.6–5.1)
Alkaline phosphatase (APISO): 26 U/L — ABNORMAL LOW (ref 37–153)
BUN/Creatinine Ratio: 18 (calc) (ref 6–22)
BUN: 19 mg/dL (ref 7–25)
CO2: 29 mmol/L (ref 20–32)
Calcium: 10.4 mg/dL (ref 8.6–10.4)
Chloride: 103 mmol/L (ref 98–110)
Creat: 1.07 mg/dL — ABNORMAL HIGH (ref 0.50–1.05)
Globulin: 2.4 g/dL (calc) (ref 1.9–3.7)
Glucose, Bld: 95 mg/dL (ref 65–99)
Potassium: 4.4 mmol/L (ref 3.5–5.3)
Sodium: 140 mmol/L (ref 135–146)
Total Bilirubin: 0.4 mg/dL (ref 0.2–1.2)
Total Protein: 7.1 g/dL (ref 6.1–8.1)
eGFR: 57 mL/min/{1.73_m2} — ABNORMAL LOW (ref >=60)

## 2022-12-19 LAB — CBC WITH DIFFERENTIAL/PLATELET
Absolute Monocytes: 1116 cells/uL — ABNORMAL HIGH (ref 200–950)
Basophils Absolute: 29 cells/uL (ref 0–200)
Basophils Relative: 0.3 %
Eosinophils Absolute: 39 cells/uL (ref 15–500)
Eosinophils Relative: 0.4 %
HCT: 41.7 % (ref 35.0–45.0)
Hemoglobin: 13.7 g/dL (ref 11.7–15.5)
Lymphs Abs: 2357 cells/uL (ref 850–3900)
MCH: 29.2 pg (ref 27.0–33.0)
MCHC: 32.9 g/dL (ref 32.0–36.0)
MCV: 88.9 fL (ref 80.0–100.0)
MPV: 10.2 fL (ref 7.5–12.5)
Monocytes Relative: 11.5 %
Neutro Abs: 6160 cells/uL (ref 1500–7800)
Neutrophils Relative %: 63.5 %
Platelets: 294 10*3/uL (ref 140–400)
RBC: 4.69 10*6/uL (ref 3.80–5.10)
RDW: 13.1 % (ref 11.0–15.0)
Total Lymphocyte: 24.3 %
WBC: 9.7 10*3/uL (ref 3.8–10.8)

## 2022-12-19 LAB — LIPID PANEL
Cholesterol: 189 mg/dL (ref <200)
HDL: 81 mg/dL (ref >=50)
LDL Cholesterol (Calc): 88 mg/dL (calc)
Non-HDL Cholesterol (Calc): 108 mg/dL (calc) (ref <130)
Total CHOL/HDL Ratio: 2.3 (calc) (ref <5.0)
Triglycerides: 107 mg/dL (ref <150)

## 2022-12-19 LAB — TSH: TSH: 2.08 mIU/L (ref 0.40–4.50)

## 2023-01-28 ENCOUNTER — Other Ambulatory Visit: Payer: Self-pay | Admitting: Nurse Practitioner

## 2023-02-07 ENCOUNTER — Other Ambulatory Visit: Payer: Self-pay | Admitting: Nurse Practitioner

## 2023-02-27 ENCOUNTER — Other Ambulatory Visit: Payer: Self-pay | Admitting: Nurse Practitioner

## 2023-02-27 DIAGNOSIS — E782 Mixed hyperlipidemia: Secondary | ICD-10-CM

## 2023-03-01 ENCOUNTER — Ambulatory Visit: Payer: Medicare HMO | Admitting: Adult Health

## 2023-03-01 ENCOUNTER — Encounter: Payer: Self-pay | Admitting: Adult Health

## 2023-03-01 VITALS — BP 124/63 | HR 77 | Ht 61.0 in | Wt 92.0 lb

## 2023-03-01 DIAGNOSIS — F0390 Unspecified dementia without behavioral disturbance: Secondary | ICD-10-CM

## 2023-03-01 NOTE — Patient Instructions (Signed)
Your Plan:  Continue Aricept and Namenda If your symptoms worsen or you develop new symptoms please let us know.   Thank you for coming to see us at [facility]. I hope we have been able to provide you high quality care today.  You may receive a patient satisfaction survey over the next few weeks. We would appreciate your feedback and comments so that we may continue to improve ourselves and the health of our patients.  
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
## 2023-04-21 NOTE — Telephone Encounter (Signed)
Prolia VOB initiated via [email or web address]  Next Prolia inj DUE: 05/31/23

## 2023-05-05 NOTE — Telephone Encounter (Signed)
Prior Auth renewal REQUIRED for [employer]

## 2023-05-08 NOTE — Progress Notes (Unsigned)
FOLLOW UP   Assessment and Plan:   Other osteoporosis without current pathological fracture She is receiving Prolia Q62months via Dr. Elvera Lennox and is taking Ca++ and Vit D. Last injection 03/14/22- missed last injection which was due 08/2022, last injection given 11/28/22 Last DEXA 2019, has been ordered multiple times by Dr Elvera Lennox but never completed- she does not plan to order another for now  Hypothyroidism, unspecified type continue medications the same pending lab results reminded to take on an empty stomach 30-75mins before food.  -     TSH  Mixed hyperlipidemia Taking Crestor, fish oil LDL goal <100 decrease fatty foods increase activity.  -     Lipid panel  Other migraine without status migrainosus, not intractable Recently improved; remains off of aimovig and remains controlled Has Zolitriptan PRN  Abnormal Glucose Recent A1Cs at goal Discussed diet/exercise, weight management  Defer A1C; check CMP  Vitamin D deficiency Taking daily supplimentation, continue  Gastroesophageal reflux disease, esophagitis presence not specified Continue PPI/H2i; do food log to identify triggers Discussed diet, avoiding triggers and other lifestyle changes  Essential Hypertension - continue medications, DASH diet, exercise and monitor at home. Call (if greater than 130/80.  Patient to monitor at home and contact office if elevation is consistent . CKD stage 3 A(HCC) Increase fluids, avoid NSAIDS, monitor sugars, will monitor - CMP - microalbumin /creatinine urine ratio  Medication management        -     Magnesium  -     TSH -     COMPLETE METABOLIC PANEL WITH GFR -     Urinalysis w microscopic + reflex cultur  BMI <19/ Mild protein/calorie malnutrition Weight gain encouraged; boost, ensure, high protein calorie diet reviewed and recommended Improving with low dose remeron 7.5 mg daily, continue for now  Aim to get back up to baseline 100 lb Check CMP/GFR  Dementia  without behavioral disturbance (HCC) Poor short term memory and attention/calculation Given information on memory compensation strategies and lions mane mushrooms Dr. Terrace Arabia following Continue aricept/namenda Does have chronic microvascular changes - vascular health, exercise, mind diet, memory exercises reviewed Monitor  Microalbuminuria - repeat microalbumin/creatinine ratio   IUD in place x 40+ years Decision by GYN to leave in place  Lumbar spondylosis Followed by Pain Management at [facility]    Discussed med's effects and SE's. Screening labs and tests as requested with regular follow-up as recommended. Over 40 minutes of exam, counseling, chart review, and complex, high level critical decision making was performed this visit.   Future Appointments  Date Time Provider Department Center  05/09/2023  2:30 PM Raynelle Dick, NP [facility] None  06/19/2023  1:45 PM [facility] NURSE [facility] None  08/14/2023  2:00 PM Raynelle Dick, NP [facility] None  10/04/2023  2:00 PM Butch Penny, NP [facility] None  11/26/2023  1:40 PM Carlus Pavlov, MD [facility] None      HPI  67 y.o. female  presents for follow up for has Hypertension; Hyperlipidemia; GERD (gastroesophageal reflux disease); Cervical spondylosis without myelopathy; Headache, migraine; Vitamin D deficiency; Medication management; Age-related osteoporosis without current pathological fracture; Hypothyroidism; Insomnia; Right bundle branch block (RBBB) on electrocardiogram (ECG); Adult BMI <19 kg/sq m; CKD (chronic kidney disease) stage 2, GFR 60-89 ml/min; Cognitive impairment; Dementia without behavioral disturbance, psychotic disturbance, mood disturbance, or anxiety (HCC); and Microalbuminuria on their problem list.   She has had migraines since she was 67 y/o; she is on amlodipine 5 mg and aimovig injection for many years  but was able to taper off recently. She has zolmitriptan as abortive which works well for her but  last use was remote.   Significant memory changes in recent years, persistent poor short term recall over the pandemic and recently referred to est with new neurologist Dr. Terrace Arabia, had MRI showing advanced for age microvascular changes, and mild generalized atrophy, felt most consistent with central nervous system degenerative disorder and has been initiated on aricept/nemenda, patient notes memory is stable/improved in the last 6 months. Husband helps to monitor/manage meds.   She is doing well with trazodone 150 mg daily for sleep. Mirtazapine as well for appetite.  She is sleeping 10 hours a night.   She has a diagnosis of GERD which is currently managed by lifestyle, rare breakthrough, takes tums occasionally.   BMI is There is no height or weight on file to calculate BMI., she has been working on diet and exercise. Back pain is improved . Her weight was down to 89 lb, started on low dose remeron 7.5 mg with good perceived results, weight up 7 LBS. She has been eating a little better, cereal with fruit and milk. She does have 2 border collies and walks them Wt Readings from Last 3 Encounters:  03/01/23 92 lb (41.7 kg)  12/18/22 96 lb 3.2 oz (43.6 kg)  11/28/22 97 lb 6.4 oz (44.2 kg)   Her blood pressure has been controlled at home currently on Norvasc 5 mg QD, today their BP is    BP Readings from Last 3 Encounters:  03/01/23 124/63  12/18/22 (!) 124/58  11/28/22 (!) 110/58  She does workout in the form of walking. She denies chest pain, shortness of breath, dizziness.   She is on cholesterol medication (rosuvastatin 20 mg daily)  and denies myalgias. Her cholesterol is not at goal. The cholesterol last visit was:   Lab Results  Component Value Date   CHOL 189 12/18/2022   HDL 81 12/18/2022   LDLCALC 88 12/18/2022   TRIG 107 12/18/2022   CHOLHDL 2.3 12/18/2022   She has been working on diet and exercise for hx of prediabetes, she is not on bASA, she is not on ACE/ARB and denies nausea,  polydipsia and polyuria. Last A1C in the office was:  Lab Results  Component Value Date   HGBA1C 5.4 08/08/2022   She is on thyroid medication. Her medication was not changed last visit.  Taking 25 mcg daily.  Lab Results  Component Value Date   TSH 2.08 12/18/2022   Last GFR: Lab Results  Component Value Date   EGFR 57 (L) 12/18/2022    Patient is on Vitamin D supplement, now taking 1000 mcg, was recommende dreduced dose by Dr. Elvera Lennox. She manages osteoporosis, getting prolia injections.She is receiving Prolia Q59months via Dr. Elvera Lennox and is taking Ca++ and Vit D. Last injection 03/14/22- missed last injection which was due 08/2022, last injection given 11/28/22 at Dr Althea Charon Lab Results  Component Value Date   VD25OH 47 08/08/2022       Current Medications:  Current Outpatient Medications on File Prior to Visit  Medication Sig Dispense Refill   amLODipine (NORVASC) 5 MG tablet Take  1 tablet  Daily for BP                                                                                /  TAKE                                                      BY                                                 MOUTH                                                    ONE TIME 90 tablet 3   Cholecalciferol 25 MCG (1000 UT) capsule Take 1 capsule (1,000 Units total) by mouth daily.     donepezil (ARICEPT) 10 MG tablet Take 1 tablet (10 mg total) by mouth at bedtime. 90 tablet 4   levothyroxine (SYNTHROID) 25 MCG tablet TAKE ONE TABLET BY MOUTH ONE TIME DAILY ON AN EMPTY STOMACH WITH ONLY WATER FOR 30 MINUTES AND NO ANTACID MEDS, CALCIUM, OR MAGNESIUM FOR 4 HOURS AND AVOID BIOTIN 90 tablet 3   memantine (NAMENDA) 10 MG tablet Take 1 tablet (10 mg total) by mouth 2 (two) times daily. 180 tablet 3   mirtazapine (REMERON) 7.5 MG tablet ^Take 1 tablet at Bedtime for Sleep                                                                     /                                                         TAKE                                      BY                                  MOUTH 90 tablet 3   Multiple Vitamin (MULTIVITAMIN ADULT PO) PRN     oxymetazoline (NASAL RELIEF) 0.05 % nasal spray Place into the nose.     rosuvastatin (CRESTOR) 20 MG tablet TAKE ONE TABLET BY MOUTH ONE TIME DAILY FOR CHOLESTROL 90 tablet 1   tiZANidine (ZANAFLEX) 4 MG tablet Take 1/2 to 1 tablet 2 to 3 x /day as needed for Back Pain                                        /  TAKE                                            BY                                      MOUTH 90 tablet 1   traZODone (DESYREL) 150 MG tablet TAKE ONE-HALF TO ONE TABLET BY MOUTH EVERY EVENING ONE HOUR BEFORE BEDTIME AS NEEDED FOR SLEEP 90 tablet 1   No current facility-administered medications on file prior to visit.   Allergies:  Allergies  Allergen Reactions   Aleve [Naproxen Sodium] Rash        Lipitor [Atorvastatin] Other (See Comments)   Niacin And Related Anaphylaxis   Omnaris [Ciclesonide] Other (See Comments)    headache   Ceftin [Cefuroxime Axetil] Rash   Other Other (See Comments)   Levaquin [Levofloxacin In D5w]     Muscle cramps   Simvastatin    Zetia [Ezetimibe]    Medical History:  She has Hypertension; Hyperlipidemia; GERD (gastroesophageal reflux disease); Cervical spondylosis without myelopathy; Headache, migraine; Vitamin D deficiency; Medication management; Age-related osteoporosis without current pathological fracture; Hypothyroidism; Insomnia; Right bundle branch block (RBBB) on electrocardiogram (ECG); Adult BMI <19 kg/sq m; CKD (chronic kidney disease) stage 2, GFR 60-89 ml/min; Cognitive impairment; Dementia without behavioral disturbance, psychotic disturbance, mood disturbance, or anxiety (HCC); and Microalbuminuria on their problem list.  Eye Doctor Dr. Gentry Fitz 2023 Dentist: [facility] 2024  Patient Care Team: Lucky Cowboy,  MD as PCP - General (Internal Medicine) Meryl Dare, MD as Consulting Physician (Gastroenterology) Pricilla Riffle, MD as Consulting Physician (Cardiology) Kerrin Champagne, MD (Inactive) as Consulting Physician (Orthopedic Surgery) Waymon Budge, MD as Consulting Physician (Pulmonary Disease) Annia Belt, MD as Referring Physician (Neurology) Peggye Pitt, MD (Pain Medicine)  Surgical History:  She has a past surgical history that includes abdominal wall tear; Mouth surgery; fractured right wrist; Wrist surgery (Right, 1973); and LEEP. Family History:  Herfamily history includes Alzheimer's disease in her father; Depression in her mother; Hyperlipidemia in her father; Hypertension in her father; Migraines in her brother, maternal grandmother, mother, and sister; Stroke in her mother. Social History:  She reports that she quit smoking about 28 years ago. Her smoking use included cigarettes. She started smoking about 48 years ago. She has a 19.9 pack-year smoking history. She has never used smokeless tobacco. She reports that she does not drink alcohol and does not use drugs.      03/01/2023    2:32 PM 12/18/2022    2:21 PM 08/05/2021   10:54 AM  MMSE - Mini Mental State Exam  Orientation to time 2 1 4   Orientation to Place 1 4 5   Registration 3 3 3   Attention/ Calculation 0 0 2  Recall 0 0 0  Language- name 2 objects 2 2 2   Language- repeat 1 1 1   Language- follow 3 step command 2 3 3   Language- read & follow direction 1 1 1   Write a sentence 1 0 1  Copy design 0 0 1  Total score 13 15 23       Review of Systems: Review of Systems  Constitutional:  Negative for malaise/fatigue and weight loss.  HENT:  Negative for hearing loss and tinnitus.   Eyes:  Negative for blurred vision and double vision.  Respiratory:  Negative for cough, sputum production, shortness of breath and wheezing.   Cardiovascular:  Negative for chest pain, palpitations, orthopnea, claudication, leg  swelling and PND.  Gastrointestinal:  Negative for abdominal pain, blood in stool, constipation, diarrhea, heartburn, melena, nausea and vomiting.  Genitourinary: Negative.   Musculoskeletal:  Positive for back pain. Negative for falls, joint pain and myalgias.  Skin:  Negative for rash.  Neurological:  Negative for dizziness, tingling, sensory change, weakness and headaches (none in over 6 months).  Endo/Heme/Allergies:  Negative for polydipsia.  Psychiatric/Behavioral:  Positive for memory loss (memory changes, poor short term memory). Negative for depression, substance abuse and suicidal ideas. The patient is not nervous/anxious and does not have insomnia.   All other systems reviewed and are negative.   Physical Exam: Estimated body mass index is 17.38 kg/m as calculated from the following:   Height as of 03/01/23: 5\' 1"  (1.549 m).   Weight as of 03/01/23: 92 lb (41.7 kg). There were no vitals taken for this visit. General Appearance: Thin pleasant female Eyes: PERRLA, EOMs, conjunctiva no swelling or erythema Sinuses: No Frontal/maxillary tenderness  ENT/Mouth: Ext aud canals clear, normal light reflex with TMs without erythema, bulging. Good dentition. No erythema, swelling, or exudate on post pharynx. Tonsils not swollen or erythematous. Hearing normal.   Neck: Supple, thyroid normal. No bruits  Respiratory: Respiratory effort normal, BS equal bilaterally without rales, rhonchi, wheezing or stridor.  Cardio: RRR without murmurs, rubs or gallops. Brisk peripheral pulses without edema.  Abdomen: Soft, nontender, no guarding, rebound, hernias, masses, or organomegaly.  Lymphatics: Non tender without lymphadenopathy.  Musculoskeletal: Full ROM all peripheral extremities,5/5 strength, and normal gait Skin: Warm, dry without rashes, lesions, ecchymosis. Neuro: Cranial nerves intact, reflexes equal bilaterally. Normal muscle tone, no cerebellar symptoms. Sensation intact. Poor short term  memory, good attention.  Psych: Awake and oriented X 2, normal affect, Insight and Judgment appropriate.      Raynelle Dick, NP-C 1:09 PM [facility]

## 2023-05-09 ENCOUNTER — Encounter: Payer: Self-pay | Admitting: Nurse Practitioner

## 2023-05-09 ENCOUNTER — Ambulatory Visit (INDEPENDENT_AMBULATORY_CARE_PROVIDER_SITE_OTHER): Payer: Medicare HMO | Admitting: Nurse Practitioner

## 2023-05-09 VITALS — BP 96/68 | HR 78 | Temp 97.3°F | Ht 61.0 in | Wt 87.0 lb

## 2023-05-09 DIAGNOSIS — R8290 Unspecified abnormal findings in urine: Secondary | ICD-10-CM | POA: Diagnosis not present

## 2023-05-09 DIAGNOSIS — R7309 Other abnormal glucose: Secondary | ICD-10-CM

## 2023-05-09 DIAGNOSIS — I10 Essential (primary) hypertension: Secondary | ICD-10-CM | POA: Diagnosis not present

## 2023-05-09 DIAGNOSIS — M47816 Spondylosis without myelopathy or radiculopathy, lumbar region: Secondary | ICD-10-CM

## 2023-05-09 DIAGNOSIS — E782 Mixed hyperlipidemia: Secondary | ICD-10-CM

## 2023-05-09 DIAGNOSIS — Z681 Body mass index (BMI) 19 or less, adult: Secondary | ICD-10-CM | POA: Diagnosis not present

## 2023-05-09 DIAGNOSIS — E441 Mild protein-calorie malnutrition: Secondary | ICD-10-CM

## 2023-05-09 DIAGNOSIS — Z79899 Other long term (current) drug therapy: Secondary | ICD-10-CM | POA: Diagnosis not present

## 2023-05-09 DIAGNOSIS — F0390 Unspecified dementia without behavioral disturbance: Secondary | ICD-10-CM

## 2023-05-09 DIAGNOSIS — N1831 Chronic kidney disease, stage 3a: Secondary | ICD-10-CM | POA: Diagnosis not present

## 2023-05-09 DIAGNOSIS — E43 Unspecified severe protein-calorie malnutrition: Secondary | ICD-10-CM

## 2023-05-09 DIAGNOSIS — R809 Proteinuria, unspecified: Secondary | ICD-10-CM

## 2023-05-09 DIAGNOSIS — G43809 Other migraine, not intractable, without status migrainosus: Secondary | ICD-10-CM | POA: Diagnosis not present

## 2023-05-09 DIAGNOSIS — E039 Hypothyroidism, unspecified: Secondary | ICD-10-CM

## 2023-05-09 DIAGNOSIS — K219 Gastro-esophageal reflux disease without esophagitis: Secondary | ICD-10-CM

## 2023-05-09 DIAGNOSIS — E559 Vitamin D deficiency, unspecified: Secondary | ICD-10-CM

## 2023-05-09 DIAGNOSIS — M810 Age-related osteoporosis without current pathological fracture: Secondary | ICD-10-CM | POA: Diagnosis not present

## 2023-05-09 DIAGNOSIS — Z975 Presence of (intrauterine) contraceptive device: Secondary | ICD-10-CM

## 2023-05-09 NOTE — Patient Instructions (Signed)
Dementia Caregiver Guide Dementia is a condition that affects the way the brain works. It often affects thinking and memory. A person with dementia may: Forget things. Have trouble talking or responding to your questions. Have trouble paying attention. Have trouble thinking clearly and making good decisions. Get lost or wander away from home or other places. Have big changes in their mood or emotions. They may: Feel very worried, nervous, or depressed. Have angry outbursts. Be suspicious or accuse you of things. Have childlike behavior and language. Taking care of someone with dementia can be a challenge. The tips below can help you care for the person. How to help manage lifestyle changes Dementia usually gets worse slowly over time. In the early stages, people with dementia can stay safe and take care of themselves with some help. In later stages, they need help with daily tasks like getting dressed, grooming, and going to the bathroom. Communicating When the person is talking and seems frustrated, make eye contact and hold the person's hand. Ask questions that can be answered with a yes or no. Use simple words and a calm voice. Only give one direction at a time. Limit choices for the person. Too many choices can be stressful. Avoid correcting the person in a negative way. If the person can't find the right words, gently try to help. Preventing injury  Keep floors clear. Remove rugs, magazine racks, and floor lamps. Keep hallways well lit, especially at night. Put a handrail and nonslip mat in the bathtub or shower. Put childproof locks on cabinets that have dangerous items in them. These items include medicine, alcohol, guns, cleaning products, and sharp tools. For doors to the outside, put locks where the person can't see or reach them. This helps keep the person from going out of the house and getting lost. Be ready for emergencies. Keep a list of emergency phone numbers and  addresses close by. Remove car keys and lock garage doors so the person doesn't try to drive. Have the person wear a bracelet that tracks where they are and shows that they're a person with memory loss. This should be worn at all times for safety. Helping with daily life  Keep the person on track with their daily routine. Try to identify areas where the person may need help. Be supportive, patient, calm, and encouraging. Gently remind the person that adjusting to changes takes time. Help with the tasks that the person has asked for help with. Keep the person involved in daily tasks and decisions as much as you can. Encourage conversation, but try not to get frustrated if the person struggles to find words or doesn't seem to appreciate your help. Other tips Think about any safety risks and take steps to avoid them. Keep things organized: Organize medicines in a pill box for each day of the week. Keep a calendar in a central place. Use it to remind the person of health care visits or other activities. Create a plan to handle any legal or financial matters. Get help from a professional if needed. Help make sure the person: Takes medicines only as told by their health care providers. Eats regular, healthy meals. They should also drink plenty of fluids. Goes to all scheduled health care appointments. Gets regular sleep. Taking care of yourself Being a caregiver for someone with dementia can be hard. You may feel stressed and have many other emotions. It's important to also take care of yourself. Here are some tips: Find out about services that  can provide short-term care for the person. This is called respite care. It can allow you to take a break when you need one. Find healthy ways to deal with stress. Some ways include: Spending time with other people. Exercising. Meditating or doing deep breathing exercises. Take care of your own health by: Getting enough sleep. Eating healthy  foods. Getting regular exercise. Join a support group with others who are caregivers. These groups can help you: Learn other ways to deal with stress. Share experiences with others. Get emotional comfort and support. Learn about caregiving as the disease gets worse. Find resources in your community. Where to find support: Many people and organizations offer support. These include: Support groups for people with dementia. Support groups for caregivers. Counselors or therapists. Home health care services. Adult day care centers. Where to find more information Alzheimer's Association: Western[employer] Family Caregiver Alliance: caregiver.org Alzheimer's Foundation of [place]: alzfdn.org Contact a health care provider if: The person's health is quickly getting worse. You're no longer able to care for the person. Caring for the person is affecting your physical and emotional health. You're feeling worried, nervous, or depressed about caring for the person. Get help right away if: You feel like the person may hurt themselves or others. The person has talked about taking their own life. These symptoms may be an emergency. Take one of these steps right away: Go to your nearest emergency room. Call 911. Call the National Suicide Prevention Lifeline at 401 856 7434 or 988. Text the Crisis Text Line at 320-444-2567. This information is not intended to replace advice given to you by your health care provider. Make sure you discuss any questions you have with your health care provider. Document Revised: 10/27/2022 Document Reviewed: 10/27/2022 Elsevier Patient Education  2024 [employer].

## 2023-05-10 ENCOUNTER — Encounter: Payer: Self-pay | Admitting: Nurse Practitioner

## 2023-05-10 DIAGNOSIS — E782 Mixed hyperlipidemia: Secondary | ICD-10-CM | POA: Diagnosis not present

## 2023-05-10 DIAGNOSIS — K219 Gastro-esophageal reflux disease without esophagitis: Secondary | ICD-10-CM | POA: Diagnosis not present

## 2023-05-10 DIAGNOSIS — E039 Hypothyroidism, unspecified: Secondary | ICD-10-CM | POA: Diagnosis not present

## 2023-05-10 DIAGNOSIS — N1831 Chronic kidney disease, stage 3a: Secondary | ICD-10-CM | POA: Diagnosis not present

## 2023-05-10 DIAGNOSIS — Z79899 Other long term (current) drug therapy: Secondary | ICD-10-CM | POA: Diagnosis not present

## 2023-05-10 DIAGNOSIS — R8290 Unspecified abnormal findings in urine: Secondary | ICD-10-CM | POA: Diagnosis not present

## 2023-05-10 DIAGNOSIS — R809 Proteinuria, unspecified: Secondary | ICD-10-CM | POA: Diagnosis not present

## 2023-05-10 LAB — MAGNESIUM: Magnesium: 2.4 mg/dL (ref 1.5–2.5)

## 2023-05-10 LAB — COMPLETE METABOLIC PANEL WITH GFR
AG Ratio: 2 (calc) (ref 1.0–2.5)
ALT: 11 U/L (ref 6–29)
AST: 20 U/L (ref 10–35)
Albumin: 4.9 g/dL (ref 3.6–5.1)
Alkaline phosphatase (APISO): 30 U/L — ABNORMAL LOW (ref 37–153)
BUN: 11 mg/dL (ref 7–25)
CO2: 29 mmol/L (ref 20–32)
Calcium: 10.2 mg/dL (ref 8.6–10.4)
Chloride: 100 mmol/L (ref 98–110)
Creat: 0.99 mg/dL (ref 0.50–1.05)
Globulin: 2.5 g/dL (ref 1.9–3.7)
Glucose, Bld: 222 mg/dL — ABNORMAL HIGH (ref 65–99)
Potassium: 3.9 mmol/L (ref 3.5–5.3)
Sodium: 139 mmol/L (ref 135–146)
Total Bilirubin: 0.5 mg/dL (ref 0.2–1.2)
Total Protein: 7.4 g/dL (ref 6.1–8.1)
eGFR: 62 mL/min/{1.73_m2} (ref >=60)

## 2023-05-10 LAB — CBC WITH DIFFERENTIAL/PLATELET
Absolute Monocytes: 660 {cells}/uL (ref 200–950)
Basophils Absolute: 40 {cells}/uL (ref 0–200)
Basophils Relative: 0.3 %
Eosinophils Absolute: 26 {cells}/uL (ref 15–500)
Eosinophils Relative: 0.2 %
HCT: 44.9 % (ref 35.0–45.0)
Hemoglobin: 14.3 g/dL (ref 11.7–15.5)
Lymphs Abs: 1188 {cells}/uL (ref 850–3900)
MCH: 28.7 pg (ref 27.0–33.0)
MCHC: 31.8 g/dL — ABNORMAL LOW (ref 32.0–36.0)
MCV: 90.2 fL (ref 80.0–100.0)
MPV: 10.4 fL (ref 7.5–12.5)
Monocytes Relative: 5 %
Neutro Abs: 11286 {cells}/uL — ABNORMAL HIGH (ref 1500–7800)
Neutrophils Relative %: 85.5 %
Platelets: 318 10*3/uL (ref 140–400)
RBC: 4.98 10*6/uL (ref 3.80–5.10)
RDW: 12.8 % (ref 11.0–15.0)
Total Lymphocyte: 9 %
WBC: 13.2 10*3/uL — ABNORMAL HIGH (ref 3.8–10.8)

## 2023-05-10 LAB — LIPID PANEL
Cholesterol: 178 mg/dL (ref <200)
HDL: 74 mg/dL (ref >=50)
LDL Cholesterol (Calc): 84 mg/dL
Non-HDL Cholesterol (Calc): 104 mg/dL (ref <130)
Total CHOL/HDL Ratio: 2.4 (calc) (ref <5.0)
Triglycerides: 108 mg/dL (ref <150)

## 2023-05-10 LAB — TSH: TSH: 2.99 m[IU]/L (ref 0.40–4.50)

## 2023-05-11 LAB — URINALYSIS, ROUTINE W REFLEX MICROSCOPIC
Bacteria, UA: NONE SEEN /[HPF]
Bilirubin Urine: NEGATIVE
Hgb urine dipstick: NEGATIVE
Ketones, ur: NEGATIVE
Leukocytes,Ua: NEGATIVE
Nitrite: NEGATIVE
RBC / HPF: NONE SEEN /[HPF] (ref 0–2)
Specific Gravity, Urine: 1.011 (ref 1.001–1.035)
Squamous Epithelial / HPF: NONE SEEN /[HPF] (ref <=5)
WBC, UA: NONE SEEN /[HPF] (ref 0–5)
pH: 8 (ref 5.0–8.0)

## 2023-05-11 LAB — MICROALBUMIN / CREATININE URINE RATIO
Creatinine, Urine: 78 mg/dL (ref 20–275)
Microalb Creat Ratio: 178 mg/g{creat} — ABNORMAL HIGH (ref <30)
Microalb, Ur: 13.9 mg/dL

## 2023-05-11 LAB — URINE CULTURE
MICRO NUMBER:: 15580924
Result:: NO GROWTH
SPECIMEN QUALITY:: ADEQUATE

## 2023-05-11 LAB — MICROSCOPIC MESSAGE

## 2023-06-04 NOTE — Progress Notes (Unsigned)
Assessment and Plan:  There are no diagnoses linked to this encounter.    Further disposition pending results of labs. Discussed med's effects and SE's.   Over 30 minutes of exam, counseling, chart review, and critical decision making was performed.   Future Appointments  Date Time Provider Department Center  06/05/2023  2:00 PM Raynelle Dick, NP [facility] None  06/19/2023  1:45 PM [facility] NURSE [facility] None  08/14/2023  2:00 PM Raynelle Dick, NP [facility] None  10/04/2023  2:00 PM Butch Penny, NP [facility] None  11/26/2023  1:40 PM Carlus Pavlov, MD [facility] None    ------------------------------------------------------------------------------------------------------------------   HPI There were no vitals taken for this visit. 67 y.o.female presents for  Past Medical History:  Diagnosis Date   Carpal tunnel syndrome on right    History of concussion    1972, 1977, 1983   Hyperlipidemia    Hypertension    Migraines    pain clinic [facility]   Osteoporosis      Allergies  Allergen Reactions   Aleve [Naproxen Sodium] Rash        Lipitor [Atorvastatin] Other (See Comments)   Niacin And Related Anaphylaxis   Omnaris [Ciclesonide] Other (See Comments)    headache   Ceftin [Cefuroxime Axetil] Rash   Other Other (See Comments)   Levaquin [Levofloxacin In D5w]     Muscle cramps   Simvastatin    Zetia [Ezetimibe]     Current Outpatient Medications on File Prior to Visit  Medication Sig   Cholecalciferol 25 MCG (1000 UT) capsule Take 1 capsule (1,000 Units total) by mouth daily.   donepezil (ARICEPT) 10 MG tablet Take 1 tablet (10 mg total) by mouth at bedtime.   levothyroxine (SYNTHROID) 25 MCG tablet TAKE ONE TABLET BY MOUTH ONE TIME DAILY ON AN EMPTY STOMACH WITH ONLY WATER FOR 30 MINUTES AND NO ANTACID MEDS, CALCIUM, OR MAGNESIUM FOR 4 HOURS AND AVOID BIOTIN   memantine (NAMENDA) 10 MG tablet Take 1 tablet (10 mg total) by mouth 2 (two) times  daily.   mirtazapine (REMERON) 7.5 MG tablet ^Take 1 tablet at Bedtime for Sleep                                                                     /                                                        TAKE                                      BY                                  MOUTH   Multiple Vitamin (MULTIVITAMIN ADULT PO) PRN   oxymetazoline (NASAL RELIEF) 0.05 % nasal spray Place into the nose.   rosuvastatin (CRESTOR) 20 MG tablet TAKE ONE TABLET BY MOUTH ONE TIME DAILY FOR CHOLESTROL  traZODone (DESYREL) 150 MG tablet TAKE ONE-HALF TO ONE TABLET BY MOUTH EVERY EVENING ONE HOUR BEFORE BEDTIME AS NEEDED FOR SLEEP   No current facility-administered medications on file prior to visit.    ROS: all negative except above.   Physical Exam:  There were no vitals taken for this visit.  General Appearance: Well nourished, in no apparent distress. Eyes: PERRLA, EOMs, conjunctiva no swelling or erythema Sinuses: No Frontal/maxillary tenderness ENT/Mouth: Ext aud canals clear, TMs without erythema, bulging. No erythema, swelling, or exudate on post pharynx.  Tonsils not swollen or erythematous. Hearing normal.  Neck: Supple, thyroid normal.  Respiratory: Respiratory effort normal, BS equal bilaterally without rales, rhonchi, wheezing or stridor.  Cardio: RRR with no MRGs. Brisk peripheral pulses without edema.  Abdomen: Soft, + BS.  Non tender, no guarding, rebound, hernias, masses. Lymphatics: Non tender without lymphadenopathy.  Musculoskeletal: Full ROM, 5/5 strength, normal gait.  Skin: Warm, dry without rashes, lesions, ecchymosis.  Neuro: Cranial nerves intact. Normal muscle tone, no cerebellar symptoms. Sensation intact.  Psych: Awake and oriented X 3, normal affect, Insight and Judgment appropriate.     Raynelle Dick, NP 1:35 PM [facility]

## 2023-06-05 ENCOUNTER — Ambulatory Visit (INDEPENDENT_AMBULATORY_CARE_PROVIDER_SITE_OTHER): Payer: Medicare HMO | Admitting: Nurse Practitioner

## 2023-06-05 ENCOUNTER — Encounter: Payer: Self-pay | Admitting: Nurse Practitioner

## 2023-06-05 VITALS — BP 120/68 | HR 74 | Temp 97.7°F | Ht 61.0 in | Wt 92.6 lb

## 2023-06-05 DIAGNOSIS — F0390 Unspecified dementia without behavioral disturbance: Secondary | ICD-10-CM

## 2023-06-05 DIAGNOSIS — Z681 Body mass index (BMI) 19 or less, adult: Secondary | ICD-10-CM

## 2023-06-05 NOTE — Patient Instructions (Signed)
Memory Compensation Strategies  Use "WARM" strategy.  W= write it down  A= associate it  R= repeat it  M= make a mental note  2.   You can keep a Memory Notebook.  Use a 3-ring notebook with sections for the following: calendar, important names and phone numbers, medications, doctors' names/phone numbers, lists/reminders, and a section to journal what you did each day.   3.    Use a calendar to write appointments down.  4.    Write yourself a schedule for the day.  This can be placed on the calendar or in a separate section of the Memory Notebook.  Keeping a  regular schedule can help memory.  5.    Use medication organizer with sections for each day or morning/evening pills.  You may need help loading it  6.    Keep a basket, or pegboard by the door.  Place items that you need to take out with you in the basket or on the pegboard.  You may also want to include a message board for reminders.  7.    Use sticky notes.  Place sticky notes with reminders in a place where the task is performed.  For example: " turn off the  stove" placed by the stove, "lock the door" placed on the door at eye level, " take your medications" on the bathroom mirror or by the place where you normally take your medications.  8.    Use alarms/timers.  Use while cooking to remind yourself to check on food or as a reminder to take your medicine, or as a  reminder to make a call, or as a reminder to perform another task, etc.  

## 2023-06-06 NOTE — Telephone Encounter (Signed)
Prior Authorization initiated for [facility] via [employer] Case ID: [id number]

## 2023-06-15 NOTE — Telephone Encounter (Signed)
Prior Auth for [facility] APPROVED PA# [id number]  Valid: 06/06/23-/06/05/24

## 2023-06-19 ENCOUNTER — Ambulatory Visit: Payer: Medicare HMO

## 2023-06-19 DIAGNOSIS — M810 Age-related osteoporosis without current pathological fracture: Secondary | ICD-10-CM | POA: Diagnosis not present

## 2023-06-19 MED ORDER — DENOSUMAB 60 MG/ML ~~LOC~~ SOSY
60.0000 mg | PREFILLED_SYRINGE | Freq: Once | SUBCUTANEOUS | Status: AC
Start: 2023-06-19 — End: 2023-06-19
  Administered 2023-06-19: 60 mg via SUBCUTANEOUS

## 2023-06-19 MED ORDER — DENOSUMAB 60 MG/ML ~~LOC~~ SOSY
60.0000 mg | PREFILLED_SYRINGE | Freq: Once | SUBCUTANEOUS | Status: AC
Start: 2023-12-17 — End: ?

## 2023-06-19 NOTE — Progress Notes (Signed)
After obtaining consent, and per orders of Dr. Elvera Lennox, injection of Prolia given by Pollie Meyer. Patient instructed to remain in clinic for 20 minutes afterwards, and to report any adverse reaction to me immediately.

## 2023-06-20 NOTE — Telephone Encounter (Signed)
Pt ready for scheduling on or after 06/06/23  Out-of-pocket cost due at time of visit: $346  Primary: [employer] Adv PPO Prolia co-insurance: 20% (approximately $320.99) Admin fee co-insurance: 20% (approximately $25)  Deductible: does not apply  Prior Auth for PROLIA APPROVED PA# [id number]  Valid: 06/06/23-/06/05/24  Secondary: N/A Prolia co-insurance:  Admin fee co-insurance:  Deductible:  Prior Auth:  PA# Valid:   ** This summary of benefits is an estimation of the patient's out-of-pocket cost. Exact cost may vary based on individual plan coverage.

## 2023-06-20 NOTE — Telephone Encounter (Signed)
Last Prolia inj 06/19/23 Next Prolia inj due 12/18/23  Prior Auth for [facility] APPROVED PA# [id number]  Valid: 06/06/23-/06/05/24

## 2023-08-09 ENCOUNTER — Encounter: Payer: Medicare Other | Admitting: Nurse Practitioner

## 2023-08-13 ENCOUNTER — Encounter: Payer: Medicare Other | Admitting: Nurse Practitioner

## 2023-08-13 NOTE — Progress Notes (Signed)
 Complete Physical  Assessment and Plan:   Isabel [name] was seen today for annual exam.  Diagnoses and all orders for this visit:  Encounter for general adult medical examination with abnormal findings Due Yearly  Age-related osteoporosis without current pathological fracture She is receiving Prolia  Q67months via Dr. Trixie and is taking Ca++ and Vit D. Last injection 03/14/22- missed last injection which was due 08/2022, last injection given 11/28/22 Last DEXA 2019, has been ordered multiple times by Dr Trixie but never completed- she does not plan to order another for now  Essential hypertension - currently BP has been controlled without Amlodipine , DASH diet, exercise and monitor at home. Call if greater than 130/80.  -     CBC with Differential/Platelet -     COMPLETE METABOLIC PANEL WITH GFR  Hypothyroidism, unspecified type Please take your thyroid  medication greater than 30 min before breakfast, separated by at least 4 hours  from antacids, calcium , iron, and multivitamins.  -     TSH  Vascular dementia with anxiety, unspecified dementia severity (HCC) Continue Namenda  and Aricept  Continue to follow with Dr. Onita Continue to make changes to lifestyle to support pt Advised Isabel [name] he should no longer leave her alone overnight  Hyperlipidemia, mixed Continue Rosuvastatin  20 mg every day Continue low saturated fat and simple carb diet -     Lipid panel  Abnormal glucose Continue diet and exercise -     Hemoglobin A1C w/out eAG  Vitamin D  deficiency Continue Vit D supplementation to maintain value in therapeutic level of 60-100  -     VITAMIN D  25 Hydroxy (Vit-D Deficiency, Fractures)  Medication management -     CBC with Differential/Platelet -     COMPLETE METABOLIC PANEL WITH GFR -     Magnesium  -     Lipid panel -     TSH -     Hemoglobin A1C w/out eAG -     VITAMIN D  25 Hydroxy (Vit-D Deficiency, Fractures) -     EKG 12-Lead -     Urinalysis, Routine w reflex  microscopic -     Microalbumin / creatinine urine ratio  Adult BMI <19 kg/sq m Continue Remeron  and focus on nutrient dense foods  CKD stage 3a, GFR 45-59 ml/min (HCC) Increase fluids, avoid NSAIDS, monitor sugars, will monitor  -     CBC with Differential/Platelet -     COMPLETE METABOLIC PANEL WITH GFR  Gastroesophageal reflux disease, unspecified whether esophagitis present Symptoms are currently well controlled without medication -     Magnesium   Other migraine without status migrainosus, not intractable Currently controlled. Requires no medication   Screening for hematuria or proteinuria -     Urinalysis, Routine w reflex microscopic -     Microalbumin / creatinine urine ratio  Screening for ischemic heart disease -     EKG 12-Lead   Complete Right Bundle Branch Block Currently asymptomatic Continue to monitor    I provided 35 minutes of face-to-face time during this encounter including counseling, chart review, and critical decision making was preformed.   Future Appointments  Date Time Provider Department Center  10/03/2023  3:00 PM Sherryl Bouchard, NP [facility] None  11/26/2023  1:40 PM Trixie File, MD [facility] None  08/14/2024  2:00 PM Quaneshia Wareing E, NP [facility] None     HPI  68 y.o. female  presents for a complete physical and follow up for has Hypertension; Hyperlipidemia; GERD (gastroesophageal reflux disease); Cervical spondylosis without myelopathy; Headache, migraine; Vitamin  D deficiency; Medication management; Age-related osteoporosis without current pathological fracture; Hypothyroidism; Insomnia; Right bundle branch block (RBBB) on electrocardiogram (ECG); Adult BMI <19 kg/sq m; CKD (chronic kidney disease) stage 2, GFR 60-89 ml/min; Cognitive impairment; Dementia without behavioral disturbance, psychotic disturbance, mood disturbance, or anxiety (HCC); and Microalbuminuria on their problem list.   She is married, presents with her Isabel [name], no  children, has 2 cats, 2 dogs, retired.   She follows with GYN . Last pap smear 2017 which stated there will be no further at Dr. Nada office, Last visit was 08/08/2022 and had mammogram 08/09/22  She has had migraines in the past, now since resolved.  She contributes this to age.     Significant memory changes in recent years and more noticeable in past 3 months.  Had MoCA of 18/30 on 08/22/21 by Dr. Onita,  had MRI showing advanced for age microvascular changes, and mild generalized atrophy, felt most consistent with central nervous system degenerative disorder Continues on Namenda  and Aricept . Saw Duwaine Russell NP in 08/23/22, next appt is 10/03/22  She is off of valium, doing well with trazodone  150 mg 1/2-1 tab nightly for sleep.   BP well controlled without medication- was previously on Amlodipine  5 mg but has not taken since 05/2023. BP Readings from Last 3 Encounters:  08/14/23 138/68  06/05/23 120/68  05/09/23 96/68  She does workout in the form of walking. She denies chest pain, shortness of breath, dizziness.   She has a diagnosis of GERD which is currently managed by lifestyle, rare breakthrough, takes tums occasionally.   BMI is Body mass index is 19.54 kg/m., she has been working on diet and exercise. She is on Remeron  to help try to gain weight. Her appetite is doing well currently.  Wt Readings from Last 3 Encounters:  08/14/23 103 lb 6.4 oz (46.9 kg)  06/05/23 92 lb 9.6 oz (42 kg)  05/09/23 87 lb (39.5 kg)    She is on cholesterol medication (rosuvastatin  20 mg daily)  and denies myalgias. Her cholesterol is not at goal. The cholesterol last visit was:   Lab Results  Component Value Date   CHOL 178 05/09/2023   HDL 74 05/09/2023   LDLCALC 84 05/09/2023   TRIG 108 05/09/2023   CHOLHDL 2.4 05/09/2023   She has been working on diet and exercise for hx of prediabetes, she is not on bASA, she is not on ACE/ARB and denies nausea, polydipsia and polyuria. Last A1C in the office  was:  Lab Results  Component Value Date   HGBA1C 5.4 08/08/2022   She is on thyroid  medication. Her medication was not changed last visit.  Taking 25 mcg daily.  Lab Results  Component Value Date   TSH 2.99 05/09/2023   She has been drinking lots of water. Last GFR: Lab Results  Component Value Date   EGFR 62 05/09/2023    Patient is on Vitamin D  supplement, now taking 1000 mcg, was recommende dreduced dose by Dr. Trixie. She manages osteoporosis, getting prolia  injections. Last DEXA was 2019, Dr. KANDICE T score -2.5 Lab Results  Component Value Date   VD25OH 47 08/08/2022        08/14/2023    2:23 PM 06/05/2023    2:04 PM 05/09/2023    3:02 PM  MMSE - Mini Mental State Exam  Orientation to time 0 1 0  Orientation to Place 2 1 1   Registration 3 3 0  Attention/ Calculation 0 0 0  Recall 0  0 0  Language- name 2 objects 2 2 2   Language- repeat 1 1 1   Language- follow 3 step command 2 3 3   Language- read & follow direction 1 1 1   Write a sentence 0 0 0  Copy design 0 0 0  Total score 11 12 8       Current Medications:  Current Outpatient Medications on File Prior to Visit  Medication Sig Dispense Refill   Cholecalciferol  25 MCG (1000 UT) capsule Take 1 capsule (1,000 Units total) by mouth daily.     donepezil  (ARICEPT ) 10 MG tablet Take 1 tablet (10 mg total) by mouth at bedtime. 90 tablet 4   levothyroxine  (SYNTHROID ) 25 MCG tablet TAKE ONE TABLET BY MOUTH ONE TIME DAILY ON AN EMPTY STOMACH WITH ONLY WATER FOR 30 MINUTES AND NO ANTACID MEDS, CALCIUM , OR MAGNESIUM  FOR 4 HOURS AND AVOID BIOTIN 90 tablet 3   memantine  (NAMENDA ) 10 MG tablet Take 1 tablet (10 mg total) by mouth 2 (two) times daily. 180 tablet 3   Multiple Vitamin (MULTIVITAMIN ADULT PO) PRN     rosuvastatin  (CRESTOR ) 20 MG tablet TAKE ONE TABLET BY MOUTH ONE TIME DAILY FOR CHOLESTROL 90 tablet 1   traZODone  (DESYREL ) 150 MG tablet TAKE ONE-HALF TO ONE TABLET BY MOUTH EVERY EVENING ONE HOUR BEFORE BEDTIME AS NEEDED  FOR SLEEP 90 tablet 1   mirtazapine  (REMERON ) 7.5 MG tablet ^Take 1 tablet at Bedtime for Sleep                                                                     /                                                        TAKE                                      BY                                  MOUTH (Patient not taking: Reported on 08/14/2023) 90 tablet 3   Current Facility-Administered Medications on File Prior to Visit  Medication Dose Route Frequency Provider Last Rate Last Admin   [START ON 12/17/2023] denosumab  (PROLIA ) injection 60 mg  60 mg Subcutaneous Once Gherghe, Cristina, MD       Allergies:  Allergies  Allergen Reactions   Aleve [Naproxen Sodium] Rash        Lipitor [Atorvastatin] Other (See Comments)   Niacin And Related Anaphylaxis   Omnaris [Ciclesonide] Other (See Comments)    headache   Ceftin [Cefuroxime Axetil] Rash   Other Other (See Comments)   Levaquin  [Levofloxacin  In D5w]     Muscle cramps   Simvastatin    Zetia [Ezetimibe]    Medical History:  She has Hypertension; Hyperlipidemia; GERD (gastroesophageal reflux disease); Cervical spondylosis without myelopathy; Headache, migraine; Vitamin D  deficiency; Medication management; Age-related osteoporosis without current  pathological fracture; Hypothyroidism; Insomnia; Right bundle branch block (RBBB) on electrocardiogram (ECG); Adult BMI <19 kg/sq m; CKD (chronic kidney disease) stage 2, GFR 60-89 ml/min; Cognitive impairment; Dementia without behavioral disturbance, psychotic disturbance, mood disturbance, or anxiety (HCC); and Microalbuminuria on their problem list. Health Maintenance:   Immunization History  Administered Date(s) Administered   Fluad Quad(high Dose 65+) 05/24/2022   Influenza, High Dose Seasonal PF 08/05/2021   Moderna Covid-19 Fall Seasonal Vaccine 59yrs & older 05/24/2022   Moderna Covid-19 Vaccine Bivalent Booster 43yrs & up 04/06/2021   Moderna SARS-COV2 Booster Vaccination 10/30/2020    Moderna Sars-Covid-2 Vaccination 09/12/2019, 10/10/2019, 05/22/2020   PFIZER Comirnaty(Gray Top)Covid-19 Tri-Sucrose Vaccine 02/19/2023   PNEUMOCOCCAL CONJUGATE-20 12/14/2021   PPD Test 09/08/2013   Pneumococcal-Unspecified 08/01/2007   Tdap 07/31/2011   Zoster, Live 11/21/2016   Health Maintenance  Topic Date Due   Zoster Vaccines- Shingrix (1 of 2) 09/05/1973   INFLUENZA VACCINE  03/01/2023   COVID-19 Vaccine (7 - 2024-25 season) 04/16/2023   DEXA SCAN  12/18/2023 (Originally 02/05/2020)   Colonoscopy  12/18/2023 (Originally 07/03/2022)   Medicare Annual Wellness (AWV)  12/18/2023   MAMMOGRAM  08/09/2024   Pneumonia Vaccine 69+ Years old  Completed   Hepatitis C Screening  Completed   HPV VACCINES  Aged Out   DTaP/Tdap/Td  Discontinued     Last Dental Exam: 2024 [facility] Last Eye Exam:  11/2021 updated with glasses   Patient Care Team: Tonita Fallow, MD as PCP - General (Internal Medicine) Aneita Gwendlyn DASEN, MD (Inactive) as Consulting Physician (Gastroenterology) Okey Vina GAILS, MD as Consulting Physician (Cardiology) Lucilla Lynwood BRAVO, MD (Inactive) as Consulting Physician (Orthopedic Surgery) Neysa Reggy BIRCH, MD as Consulting Physician (Pulmonary Disease) Domenica Reusing, MD as Referring Physician (Neurology) Rosella Lenis, MD (Pain Medicine)  Surgical History:  She has a past surgical history that includes abdominal wall tear; Mouth surgery; fractured right wrist; Wrist surgery (Right, 1973); and LEEP. Family History:  Herfamily history includes Alzheimer's disease in her father; Depression in her mother; Hyperlipidemia in her father; Hypertension in her father; Migraines in her brother, maternal grandmother, mother, and sister; Stroke in her mother. Social History:  She reports that she quit smoking about 29 years ago. Her smoking use included cigarettes. She started smoking about 49 years ago. She has a 19.9 pack-year smoking history. She has never used smokeless  tobacco. She reports that she does not drink alcohol and does not use drugs.  Review of Systems: Review of Systems  Constitutional:  Negative for malaise/fatigue and weight loss.  HENT:  Negative for hearing loss and tinnitus.   Eyes:  Negative for blurred vision and double vision.  Respiratory:  Negative for cough, sputum production, shortness of breath and wheezing.   Cardiovascular:  Negative for chest pain, palpitations, orthopnea, claudication, leg swelling and PND.  Gastrointestinal:  Negative for abdominal pain, blood in stool, constipation, diarrhea, heartburn, melena, nausea and vomiting.  Genitourinary: Negative.   Musculoskeletal:  Negative for back pain, falls, joint pain and myalgias.  Skin:  Negative for rash.  Neurological:  Negative for dizziness, tingling, sensory change, weakness and headaches.  Endo/Heme/Allergies:  Negative for polydipsia.  Psychiatric/Behavioral:  Positive for memory loss (memory changes, poor short term memory). Negative for depression, substance abuse and suicidal ideas. The patient is not nervous/anxious and does not have insomnia.   All other systems reviewed and are negative.   Physical Exam: Estimated body mass index is 19.54 kg/m as calculated from the following:  Height as of this encounter: 5' 1 (1.549 m).   Weight as of this encounter: 103 lb 6.4 oz (46.9 kg). BP 138/68   Pulse 79   Temp (!) 97.5 F (36.4 C)   Ht 5' 1 (1.549 m)   Wt 103 lb 6.4 oz (46.9 kg)   SpO2 97%   BMI 19.54 kg/m  General Appearance: Well nourished, in no apparent distress.  Eyes: PERRLA, EOMs, conjunctiva no swelling or erythema Sinuses: No Frontal/maxillary tenderness  ENT/Mouth: Ext aud canals clear, normal light reflex with TMs without erythema, bulging. Good dentition. No erythema, swelling, or exudate on post pharynx. Tonsils not swollen or erythematous. Hearing normal.   Neck: Supple, thyroid  normal. No bruits  Respiratory: Respiratory effort normal,  BS equal bilaterally without rales, rhonchi, wheezing or stridor.  Cardio: RRR without murmurs, rubs or gallops. Brisk peripheral pulses without edema.  Chest: symmetric, with normal excursions and percussion.  Breasts: defer to GYN Abdomen: Soft, nontender, no guarding, rebound, hernias, masses, or organomegaly.  Lymphatics: Non tender without lymphadenopathy.  Genitourinary: defer to GYN Musculoskeletal: Full ROM all peripheral extremities,5/5 strength, and normal gait.  Skin: Warm, dry without rashes, lesions, ecchymosis. Neuro: Cranial nerves intact, reflexes equal bilaterally. Normal muscle tone, no cerebellar symptoms. Sensation intact. Poor short term memory, poor attention, MMSE 11/30 Psych: Awake and oriented to person only, flat affect, poor insight and judgement     06/05/2023    2:04 PM 05/09/2023    3:02 PM 03/01/2023    2:32 PM  MMSE - Mini Mental State Exam  Orientation to time 1 0 2  Orientation to Place 1 1 1   Registration 3 0 3  Attention/ Calculation 0 0 0  Recall 0 0 0  Language- name 2 objects 2 2 2   Language- repeat 1 1 1   Language- follow 3 step command 3 3 2   Language- read & follow direction 1 1 1   Write a sentence 0 0 1  Copy design 0 0 0  Total score 12 8 13     EKG: Sinus rhythm, CRBBB     Chao Blazejewski [place], NP-C 2:11 PM [facility]

## 2023-08-14 ENCOUNTER — Encounter: Payer: Self-pay | Admitting: Nurse Practitioner

## 2023-08-14 ENCOUNTER — Ambulatory Visit (INDEPENDENT_AMBULATORY_CARE_PROVIDER_SITE_OTHER): Payer: Medicare Other | Admitting: Nurse Practitioner

## 2023-08-14 VITALS — BP 138/68 | HR 79 | Temp 97.5°F | Ht 61.0 in | Wt 103.4 lb

## 2023-08-14 DIAGNOSIS — Z1389 Encounter for screening for other disorder: Secondary | ICD-10-CM

## 2023-08-14 DIAGNOSIS — Z0001 Encounter for general adult medical examination with abnormal findings: Secondary | ICD-10-CM

## 2023-08-14 DIAGNOSIS — Z136 Encounter for screening for cardiovascular disorders: Secondary | ICD-10-CM

## 2023-08-14 DIAGNOSIS — Z79899 Other long term (current) drug therapy: Secondary | ICD-10-CM

## 2023-08-14 DIAGNOSIS — I10 Essential (primary) hypertension: Secondary | ICD-10-CM

## 2023-08-14 DIAGNOSIS — K219 Gastro-esophageal reflux disease without esophagitis: Secondary | ICD-10-CM

## 2023-08-14 DIAGNOSIS — E039 Hypothyroidism, unspecified: Secondary | ICD-10-CM | POA: Diagnosis not present

## 2023-08-14 DIAGNOSIS — G43809 Other migraine, not intractable, without status migrainosus: Secondary | ICD-10-CM

## 2023-08-14 DIAGNOSIS — R7309 Other abnormal glucose: Secondary | ICD-10-CM | POA: Diagnosis not present

## 2023-08-14 DIAGNOSIS — E782 Mixed hyperlipidemia: Secondary | ICD-10-CM

## 2023-08-14 DIAGNOSIS — Z0000 Encounter for general adult medical examination without abnormal findings: Secondary | ICD-10-CM

## 2023-08-14 DIAGNOSIS — E559 Vitamin D deficiency, unspecified: Secondary | ICD-10-CM

## 2023-08-14 DIAGNOSIS — Z681 Body mass index (BMI) 19 or less, adult: Secondary | ICD-10-CM

## 2023-08-14 DIAGNOSIS — I4510 Unspecified right bundle-branch block: Secondary | ICD-10-CM

## 2023-08-14 DIAGNOSIS — N1831 Chronic kidney disease, stage 3a: Secondary | ICD-10-CM | POA: Diagnosis not present

## 2023-08-14 DIAGNOSIS — F0154 Vascular dementia, unspecified severity, with anxiety: Secondary | ICD-10-CM

## 2023-08-14 DIAGNOSIS — Z1329 Encounter for screening for other suspected endocrine disorder: Secondary | ICD-10-CM

## 2023-08-14 DIAGNOSIS — M810 Age-related osteoporosis without current pathological fracture: Secondary | ICD-10-CM

## 2023-08-14 NOTE — Patient Instructions (Signed)

## 2023-08-15 LAB — CBC WITH DIFFERENTIAL/PLATELET
Absolute Lymphocytes: 2643 {cells}/uL (ref 850–3900)
Absolute Monocytes: 871 {cells}/uL (ref 200–950)
Basophils Absolute: 50 {cells}/uL (ref 0–200)
Basophils Relative: 0.5 %
Eosinophils Absolute: 129 {cells}/uL (ref 15–500)
Eosinophils Relative: 1.3 %
HCT: 43 % (ref 35.0–45.0)
Hemoglobin: 13.5 g/dL (ref 11.7–15.5)
MCH: 28.3 pg (ref 27.0–33.0)
MCHC: 31.4 g/dL — ABNORMAL LOW (ref 32.0–36.0)
MCV: 90.1 fL (ref 80.0–100.0)
MPV: 10.6 fL (ref 7.5–12.5)
Monocytes Relative: 8.8 %
Neutro Abs: 6207 {cells}/uL (ref 1500–7800)
Neutrophils Relative %: 62.7 %
Platelets: 291 10*3/uL (ref 140–400)
RBC: 4.77 10*6/uL (ref 3.80–5.10)
RDW: 12.9 % (ref 11.0–15.0)
Total Lymphocyte: 26.7 %
WBC: 9.9 10*3/uL (ref 3.8–10.8)

## 2023-08-15 LAB — MAGNESIUM: Magnesium: 2.5 mg/dL (ref 1.5–2.5)

## 2023-08-15 LAB — VITAMIN D 25 HYDROXY (VIT D DEFICIENCY, FRACTURES): Vit D, 25-Hydroxy: 97 ng/mL (ref 30–100)

## 2023-08-15 LAB — COMPLETE METABOLIC PANEL WITH GFR
AG Ratio: 1.8 (calc) (ref 1.0–2.5)
ALT: 19 U/L (ref 6–29)
AST: 25 U/L (ref 10–35)
Albumin: 4.6 g/dL (ref 3.6–5.1)
Alkaline phosphatase (APISO): 28 U/L — ABNORMAL LOW (ref 37–153)
BUN/Creatinine Ratio: 13 (calc) (ref 6–22)
BUN: 14 mg/dL (ref 7–25)
CO2: 32 mmol/L (ref 20–32)
Calcium: 10 mg/dL (ref 8.6–10.4)
Chloride: 101 mmol/L (ref 98–110)
Creat: 1.06 mg/dL — ABNORMAL HIGH (ref 0.50–1.05)
Globulin: 2.5 g/dL (ref 1.9–3.7)
Glucose, Bld: 80 mg/dL (ref 65–99)
Potassium: 4.5 mmol/L (ref 3.5–5.3)
Sodium: 142 mmol/L (ref 135–146)
Total Bilirubin: 0.4 mg/dL (ref 0.2–1.2)
Total Protein: 7.1 g/dL (ref 6.1–8.1)
eGFR: 57 mL/min/{1.73_m2} — ABNORMAL LOW (ref >=60)

## 2023-08-15 LAB — LIPID PANEL
Cholesterol: 189 mg/dL (ref <200)
HDL: 74 mg/dL (ref >=50)
LDL Cholesterol (Calc): 87 mg/dL
Non-HDL Cholesterol (Calc): 115 mg/dL (ref <130)
Total CHOL/HDL Ratio: 2.6 (calc) (ref <5.0)
Triglycerides: 189 mg/dL — ABNORMAL HIGH (ref <150)

## 2023-08-15 LAB — URINALYSIS, ROUTINE W REFLEX MICROSCOPIC
Bilirubin Urine: NEGATIVE
Glucose, UA: NEGATIVE
Hgb urine dipstick: NEGATIVE
Ketones, ur: NEGATIVE
Leukocytes,Ua: NEGATIVE
Nitrite: NEGATIVE
Protein, ur: NEGATIVE
Specific Gravity, Urine: 1.01 (ref 1.001–1.035)
pH: 8.5 — ABNORMAL HIGH (ref 5.0–8.0)

## 2023-08-15 LAB — MICROALBUMIN / CREATININE URINE RATIO
Creatinine, Urine: 45 mg/dL (ref 20–275)
Microalb Creat Ratio: 33 mg/g{creat} — ABNORMAL HIGH (ref <30)
Microalb, Ur: 1.5 mg/dL

## 2023-08-15 LAB — HEMOGLOBIN A1C W/OUT EAG: Hgb A1c MFr Bld: 5.5 %{Hb} (ref <5.7)

## 2023-08-15 LAB — TSH: TSH: 1.99 m[IU]/L (ref 0.40–4.50)

## 2023-08-21 ENCOUNTER — Telehealth: Payer: Self-pay | Admitting: Adult Health

## 2023-08-21 NOTE — Telephone Encounter (Signed)
LVM and sent mychart msg informing pt of appt change on 10/03/23- NP schedule change

## 2023-09-12 ENCOUNTER — Other Ambulatory Visit: Payer: Self-pay | Admitting: Adult Health

## 2023-10-01 ENCOUNTER — Ambulatory Visit: Payer: Medicare HMO | Admitting: Adult Health

## 2023-10-01 VITALS — BP 142/84 | HR 90 | Ht 61.0 in | Wt 106.0 lb

## 2023-10-01 DIAGNOSIS — F02C0 Dementia in other diseases classified elsewhere, severe, without behavioral disturbance, psychotic disturbance, mood disturbance, and anxiety: Secondary | ICD-10-CM

## 2023-10-01 DIAGNOSIS — G309 Alzheimer's disease, unspecified: Secondary | ICD-10-CM | POA: Diagnosis not present
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
## 2023-10-03 ENCOUNTER — Ambulatory Visit: Payer: Medicare HMO | Admitting: Adult Health

## 2023-10-04 ENCOUNTER — Ambulatory Visit: Payer: Medicare HMO | Admitting: Adult Health

## 2023-10-15 ENCOUNTER — Other Ambulatory Visit: Payer: Self-pay

## 2023-10-15 MED ORDER — TRAZODONE HCL 150 MG PO TABS: ORAL_TABLET | ORAL | 0 refills | Status: AC

## 2023-10-16 ENCOUNTER — Other Ambulatory Visit: Payer: Self-pay

## 2023-10-16 DIAGNOSIS — E039 Hypothyroidism, unspecified: Secondary | ICD-10-CM

## 2023-10-16 MED ORDER — LEVOTHYROXINE SODIUM 25 MCG PO TABS: ORAL_TABLET | ORAL | 0 refills | Status: AC

## 2023-10-30 ENCOUNTER — Ambulatory Visit: Payer: Medicare HMO | Admitting: Neurology

## 2023-11-14 NOTE — Telephone Encounter (Signed)
 Prolia VOB initiated via [email or web address]  Next Prolia inj DUE: 12/18/23

## 2023-11-26 ENCOUNTER — Ambulatory Visit: Payer: Medicare HMO | Admitting: Internal Medicine

## 2023-11-26 ENCOUNTER — Encounter: Payer: Self-pay | Admitting: Internal Medicine

## 2023-11-26 VITALS — BP 120/60 | HR 76 | Ht 61.0 in | Wt 106.6 lb

## 2023-11-26 DIAGNOSIS — R748 Abnormal levels of other serum enzymes: Secondary | ICD-10-CM | POA: Diagnosis not present

## 2023-11-26 DIAGNOSIS — M810 Age-related osteoporosis without current pathological fracture: Secondary | ICD-10-CM

## 2023-11-26 DIAGNOSIS — E559 Vitamin D deficiency, unspecified: Secondary | ICD-10-CM

## 2023-11-26 NOTE — Patient Instructions (Addendum)
Please continue  Prolia.  Please come back for a follow-up appointment in  1 year. 

## 2023-11-26 NOTE — Progress Notes (Signed)
 Patient ID: Isabel [name], female   DOB: 02-22-1956, 68 y.o.   MRN: [id number]   HPI  Isabel [name] is a 68 y.o.-year-old female, returning for follow-up for osteoporosis and history of vitamin D  deficiency.  Last visit 1 year ago. She is here with her husband who offers all history as patient is nonverbal.   Interim history: No fall/fractures since last OV. She still did not schedule another bone density since last visit. She was diagnosed with dementia in 2023.  Memantine  and Aricept  were added.  Per review of PCPs notes, this is now qualified as severe dementia.  Pt was dx with OP in 2013.  Reviewed DXA scan reports: I ordered bone density scans in 2020, 2021, 2023 and she still did not had this done yet... Date L1-L4 T score FN T score  02/04/2018 ([facility]) -3.2 RFN: -2.0 LFN: -2.4  10/15/2015 [facility])  -3.0  RFN: -1.8 LFN: -2.1   10/07/2013 [facility])  -3.1 (-5.6%*)  RFN: -1.9 LFN: -2.2   07/10/2011 [facility])  -2.8  RFN: n/a LFN: n/a   + h/o one fracture as a child: - R arm - in 69 (68 y/o)  She fell from the kitchen counter in 2000.  No fractures as an adult.  Treatments reviewed: - Alendronate : for many years >> stopped because of possible ONJ.  Pain was migratory-questionable nerve pain. - Prolia  -05/02/2018, 11/25/2018, 06/11/2019, 01/02/2020, 11/25/2020, 06/21/2021, 03/14/2022, 11/28/2022, 06/19/2023  After our last visit, she was reticent to continue with Prolia .  I referred her to Dr. Roena Clark at [facility] for a second opinion.  He suggested to continue with Prolia  versus using Actonel.  Patient decided to continue with Prolia .  Reviewed the reports  of her 3-D reconstructed images of her mouth from 09/06/2016: "The region of interest demonstrates radiographic signs that may be suggestive of osteonecrosis/osteomyelitis. If the patient has a history of taking bisphosphonate drugs, the region of interest would correlate with  osteonecrosis. Otherwise, the region of interest may be osteomyelitis. Clinical correlation is recommended".  Her N-telopeptide was not suppressed in 2018 (ideally <10), but not higher than 18: Component     Latest Ref Rng & Units 11/23/2016  N-Telopeptide     6.2 - 19.0 nM BCE 15.4   Vitamin D  level normal at last check but this was high in the past: Lab Results  Component Value Date   VD25OH 97 08/14/2023   VD25OH 47 08/08/2022   VD25OH 44 03/20/2022   VD25OH 55 08/05/2021   VD25OH 54 02/02/2021   VD25OH 59.6 11/25/2020   VD25OH 62 07/21/2020   VD25OH 41 01/21/2020   VD25OH >120 11/25/2019   VD25OH 86 07/21/2019   She is on: - Calcium  500 mg daily in MVI - Vitamin D  6000 >> 8000 >> 6000 >> 3000 >> 2000 units + MVI  >> now only in MVI (decreased by PCP 08/2023)  No weightbearing exercises. She was practicing yoga x 9 years >> then stopped b/c pain with movement.  She walks and play with her dogs in the woods behind her home.  No history of high vitamin A intake.  It is unclear when she started menopause as she was taking hormonal tx for severe HAs (from severe cervical OA). She was also on Methadone, tried Botox.  She has [facility] burns at [facility].  Her mother has a history of a dowager hump.  No history of kidney stones.  No persistently elevated calcium : Lab Results  Component Value Date   PTH 31 11/25/2020   CALCIUM  10.0 08/14/2023   CALCIUM  10.2 05/09/2023   CALCIUM  10.4 12/18/2022   CALCIUM  9.9 08/08/2022   CALCIUM  10.0 03/20/2022   CALCIUM  9.3 12/14/2021   CALCIUM  9.8 08/05/2021   CALCIUM  9.4 02/02/2021   CALCIUM  9.8 11/25/2020   CALCIUM  10.6 (H) 10/27/2020   No history of hyperthyroidism.  She takes levothyroxine  25 mcg daily.  Reviewed previous TFTs:: Lab Results  Component Value Date   TSH 1.99 08/14/2023   TSH 2.99 05/09/2023   TSH 2.08 12/18/2022   TSH 3.05 08/08/2022   TSH 1.44 03/20/2022   No CKD.  Last BUN/creatinine: Lab Results  Component Value  Date   BUN 14 08/14/2023   CREATININE 1.06 (H) 08/14/2023   She has a history of low alkaline phosphatase: 08/14/2023: Alkaline phosphatase 28 Lab Results  Component Value Date   ALKPHOS 25 (L) 11/25/2019   ALKPHOS 22 (L) 11/25/2018   ALKPHOS 26 (L) 02/26/2017   ALKPHOS 23 (L) 11/08/2016   ALKPHOS 27 (L) 05/03/2016   ALKPHOS 19 (L) 11/03/2015   ALKPHOS 24 (L) 05/04/2015   ALKPHOS 22 (L) 12/17/2014   ALKPHOS 23 (L) 09/02/2014   ALKPHOS 27 (L) 04/23/2014   ALKPHOS 29 (L) 12/16/2013   ALKPHOS 40 09/08/2013   She has extensive FH of Alzheimer on her father's side.  ROS: + see HPI  I reviewed pt's medications, allergies, PMH, social hx, family hx, and changes were documented in the history of present illness. Otherwise, unchanged from my initial visit note.  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     donepezil  (ARICEPT ) 10 MG tablet Take 1 tablet (10 mg total) by mouth at bedtime. 90 tablet 4   Fexofenadine HCl (ALLEGRA PO) Take by mouth.     levothyroxine  (SYNTHROID ) 25 MCG tablet TAKE ONE TABLET BY MOUTH  ONE TIME DAILY ON AN EMPTY STOMACH WITH ONLY WATER FOR 30 MINUTES AND NO ANTACID MEDS, CALCIUM  OR MAGNESIUM  FOR 4 HOURS AND AVOID BIOTIN. 90 tablet 0   memantine  (NAMENDA ) 10 MG tablet TAKE ONE TABLET BY MOUTH TWICE A DAY 180 tablet 3   mirtazapine  (REMERON ) 7.5 MG tablet ^Take 1 tablet at Bedtime for Sleep                                                                     /  TAKE                                      BY                                  MOUTH (Patient not taking: Reported on 10/01/2023) 90 tablet 3   Multiple Vitamin (MULTIVITAMIN ADULT PO) PRN     rosuvastatin  (CRESTOR ) 20 MG tablet TAKE ONE TABLET BY MOUTH ONE TIME DAILY FOR CHOLESTROL 90 tablet 1   traZODone  (DESYREL ) 150 MG tablet TAKE ONE-HALF TO ONE TABLET BY MOUTH EVERY EVENING ONE HOUR BEFORE BEDTIME AS NEEDED FOR SLEEP 90 tablet 0   Current Facility-Administered Medications on File Prior to Visit  Medication Dose Route Frequency Provider Last Rate Last Admin   [START ON 12/17/2023] denosumab  (PROLIA ) injection 60 mg  60 mg Subcutaneous Once Roland Lipke, MD       Allergies  Allergen Reactions   Aleve [Naproxen Sodium] Rash        Lipitor [Atorvastatin] Other (See Comments)   Niacin And Related Anaphylaxis   Omnaris [Ciclesonide] Other (See Comments)    headache   Ceftin [Cefuroxime Axetil] Rash   Other Other (See Comments)   Levaquin  [Levofloxacin  In D5w]     Muscle cramps   Simvastatin    Zetia [Ezetimibe]    Family History  Problem Relation Age of Onset   Depression Mother    Migraines Mother    Stroke Mother    Alzheimer's disease Father    Hyperlipidemia Father    Hypertension Father    Migraines Sister    Migraines Brother    Migraines Maternal Grandmother    Colon cancer Neg Hx    PE: BP 120/60   Pulse 76   Ht 5\' 1"  (1.549 m)   Wt 106 lb 9.6 oz (48.4 kg)   SpO2 99%   BMI 20.14 kg/m  Wt Readings from Last 3 Encounters:  11/26/23 106  lb 9.6 oz (48.4 kg)  10/01/23 106 lb (48.1 kg)  08/14/23 103 lb 6.4 oz (46.9 kg)   Constitutional:  thin, in NAD Eyes:  EOMI, no exophthalmos ENT: no neck masses, no cervical lymphadenopathy Cardiovascular: RRR, No MRG Respiratory: CTA B Musculoskeletal: no deformities Skin:no rashes Neurological: no tremor with outstretched hands  Assessment: 1. Osteoporosis - Of note, she finished 1 year of osteo-strong skeletal loading.  Discussed with her $89 per month and was not covered by insurance  2. Vit D deficiency  3. Low alkaline phosphatase  4.  Hypercalcemia -She had 2 instances of slightly high calcium  in the past, but at last visit, calcium  level was normal.  A PTH level was also normal -Most recently, calcium  levels were normal  Other tx'ing Dr's: Dr. Sydell Eva Dr. Lenton Rail  Plan: 1. Osteoporosis - Likely age-related/postmenopausal and she also has a family history of osteoporosis - No falls or fractures since last visit -On the latest bone mineral density report, her T-scores were slightly lower than before, however, the last report was generated on another densitometer, so the scores are not directly comparable.  They were low enough in the osteoporosis range to recommend treatment, though.  We previously discussed about getting another bone density but I ended up ordering the scan in 2021, 2022, and 2023 and she still did not have this done.  I will not order any more of these for her for now.   -She was initially very reticent to start osteoporosis treatment, as she has a high risk for ONJ (she was diagnosed with this in the past after being on Fosamax ).  After discussion about different options for treatment including benefits and possible side effects, she initially opted for Forteo, but after it became off label, and not likely to be covered by her insurance, she decided to proceed with Prolia .  First injection was on 05/02/2018. She previously contacted me about  doubts continue with Prolia  and at that time I suggested a second opinion at [facility], with Dr. Roena Clark.  He saw the patient in consultation and recommended to continue with Prolia  versus Actonel.  She decided to continue with Prolia . -Latest 2 injections were on 11/28/2022 and 06/19/2023.  She is doing a good job now getting them every 6 months.  She previously missed an injection and we discussed about putting it on her calendar.  We discussed about not lifting more than 6 to 7 months between injections to avoid rapid bone density loss and high risk for fractures. - She tolerates Prolia  well, without jaw/thigh/hip pain - We can continue with Prolia  for 10 years or more if needed -At last visit she was not doing weightbearing exercises but just walking.  She has not been very active over winter, but recently she started walking again and overall spending more time outside with her dogs.  I again recommended dumbbells, elastic bands to help with the bone mass. -she gained 9 pounds since last visit, which is excellent. - She is due for another Prolia  injection next month -I will see her back in 1 year  2. Vit D deficiency -We gradually decreased her vitamin D  daily dose from 8000 to 6000 to 3000 to 2000 units daily as her vit D was >120 in 10/2019 - Latest vitamin D  level was normal 08/14/2023 ALT close to the upper limit of the target range.  At that time, she was taking 2000 units of vitamin D  + the amount in the multivitamin.  PCP recommended to start the 2000 units supplement.  She now only takes a multivitamin.  I definitely agree with this change for her.  3. Low Aphos - She has a history of low alk phos - Reviewed possible causes and: hypophosphatasia, blood collected with EDTA or oxalate anticoagulant, hypothyroidism, vitamin C and B12 deficiency, Milk alkali syndrome, protein/calorie malnutrition, zinc and magnesium  deficiency.  -I previously recommended a multivitamin but she was not taking  this consistently at the last 2 visits.  I again advised her to take it every day.  She is currently take this consistently.  Emilie Harden, MD PhD [facility]

## 2023-12-02 NOTE — Telephone Encounter (Signed)
 Update insurance   Your patient's health plan has advised that your patient's policy terminated on 2023-11-13.  AET[employer]-MEDICARE

## 2023-12-06 NOTE — Telephone Encounter (Signed)
Insurance updated, VOB resubmitted

## 2023-12-10 NOTE — Telephone Encounter (Signed)
 [employer] - Prior Authorization REQUIRED for [employer]

## 2023-12-17 NOTE — Telephone Encounter (Signed)
 [employer]  Patient is ready for scheduling on or after 12/17/23  Out-of-pocket cost due at time of visit: $351.87  Primary: UHC AARP Medicare Adv HMO-POS Prolia  co-insurance: 20% (approximately $331.87) Admin fee co-insurance: $20  Deductible: does not apply  Prior Auth: APPROVED PA# [id number] Valid: 12/17/23-12/16/24  Secondary: N/A Prolia  co-insurance:  Admin fee co-insurance:  Deductible:  Prior Auth:  PA# Valid:   ** This summary of benefits is an estimation of the patient's out-of-pocket cost. Exact cost may vary based on individual plan coverage.

## 2023-12-17 NOTE — Telephone Encounter (Signed)
 [employer]umps  Prior Authorization for PROLIA  APPROVED PA# [id number] Valid: 12/17/23-12/16/24

## 2023-12-20 NOTE — Telephone Encounter (Signed)
 LMx1 to schedule Prolia injection.

## 2023-12-21 NOTE — Telephone Encounter (Signed)
 LMx 2 to schedule Prolia injection.

## 2023-12-28 NOTE — Telephone Encounter (Signed)
 LMx3 to schedule Prolia  injection.

## 2024-01-08 ENCOUNTER — Other Ambulatory Visit: Payer: Self-pay | Admitting: Adult Health

## 2024-02-11 ENCOUNTER — Ambulatory Visit: Payer: Medicare Other | Admitting: Nurse Practitioner

## 2024-03-31 NOTE — Telephone Encounter (Signed)
FYI  Pt > 90 days past due for PROLIA injection.   If you would like for pt to continue with Prolia therapy, please have clinical staff reach out to pt for scheduling and to explain to importance of receiving Prolia injections every 6 months as abrupt cessation of Prolia raises risk of osteoporotic fracture.    "Discontinuation of Dmab is associated with a 3- to 5-fold higher risk for vertebral, major osteoporotic, and hip fractures [38,39]."   https://www.ncbi.nlm.nih.gov/pmc/articles/PMC7796169/  

## 2024-04-01 NOTE — Telephone Encounter (Signed)
 Q, Can we please call to schedule this or send her a letter that she needs to call us  back to schedule it? Ty! C

## 2024-04-08 NOTE — Telephone Encounter (Signed)
 LMx1 for patient to return call about whether she will continue Prolia  or not.

## 2024-04-15 NOTE — Telephone Encounter (Signed)
 LMx2 to schedule next Prolia  injection.

## 2024-05-01 NOTE — Telephone Encounter (Signed)
 Benefits will need to be rechecked again prior to pt receiving Prolia . Please let me know if pt would like to continue therapy with Prolia  and I will re-run benefits.

## 2024-06-29 NOTE — Telephone Encounter (Signed)
 FYI  Pt > 90 days PAST DUE for PROLIA  injection  If you would like for pt to continue with Prolia  therapy, please have clinical staff reach out to pt for scheduling and to explain to importance of receiving Prolia  injections every 6 months as abrupt cessation of Prolia  raises risk of osteoporotic fracture.    Discontinuation of Dmab is associated with a 3- to 5-fold higher risk for vertebral, major osteoporotic, and hip fractures [38,39].   [email or web address]

## 2024-07-25 NOTE — Telephone Encounter (Signed)
 FYI   It has been > 1 year since last Prolia  injection.   Pt archived in [email or web address].  Please advise if patient and/or provider wish to proceed with Prolia  therpay.

## 2024-08-14 ENCOUNTER — Encounter: Payer: Medicare Other | Admitting: Nurse Practitioner
